# Patient Record
Sex: Female | Born: 1945
Health system: Southern US, Community
[De-identification: ages and names within clinical notes are randomized; demographics above are authoritative.]

## PROBLEM LIST (undated history)

## (undated) DIAGNOSIS — Z923 Personal history of irradiation: Secondary | ICD-10-CM

## (undated) DIAGNOSIS — R112 Nausea with vomiting, unspecified: Secondary | ICD-10-CM

## (undated) DIAGNOSIS — G459 Transient cerebral ischemic attack, unspecified: Secondary | ICD-10-CM

## (undated) DIAGNOSIS — I10 Essential (primary) hypertension: Secondary | ICD-10-CM

## (undated) DIAGNOSIS — Z9889 Other specified postprocedural states: Secondary | ICD-10-CM

## (undated) DIAGNOSIS — F419 Anxiety disorder, unspecified: Secondary | ICD-10-CM

## (undated) HISTORY — PX: BACK SURGERY: SHX140

## (undated) HISTORY — PX: ABDOMINAL HYSTERECTOMY: SHX81

## (undated) HISTORY — PX: DILATION AND CURETTAGE OF UTERUS: SHX78

---

## 1998-01-01 ENCOUNTER — Ambulatory Visit (HOSPITAL_BASED_OUTPATIENT_CLINIC_OR_DEPARTMENT_OTHER): Admission: RE | Admit: 1998-01-01 | Discharge: 1998-01-01 | Payer: Self-pay | Admitting: Orthopedic Surgery

## 1998-02-04 ENCOUNTER — Emergency Department (HOSPITAL_COMMUNITY): Admission: EM | Admit: 1998-02-04 | Discharge: 1998-02-04 | Payer: Self-pay | Admitting: Emergency Medicine

## 1998-12-06 ENCOUNTER — Inpatient Hospital Stay (HOSPITAL_COMMUNITY): Admission: RE | Admit: 1998-12-06 | Discharge: 1998-12-09 | Payer: Self-pay | Admitting: Obstetrics and Gynecology

## 1998-12-06 ENCOUNTER — Encounter (INDEPENDENT_AMBULATORY_CARE_PROVIDER_SITE_OTHER): Payer: Self-pay | Admitting: Specialist

## 1999-11-13 ENCOUNTER — Encounter: Admission: RE | Admit: 1999-11-13 | Discharge: 1999-11-13 | Payer: Self-pay | Admitting: Family Medicine

## 1999-11-13 ENCOUNTER — Encounter: Payer: Self-pay | Admitting: Family Medicine

## 2001-04-12 ENCOUNTER — Encounter (INDEPENDENT_AMBULATORY_CARE_PROVIDER_SITE_OTHER): Payer: Self-pay | Admitting: Specialist

## 2001-04-12 ENCOUNTER — Ambulatory Visit (HOSPITAL_COMMUNITY): Admission: RE | Admit: 2001-04-12 | Discharge: 2001-04-12 | Payer: Self-pay | Admitting: Gastroenterology

## 2001-07-28 ENCOUNTER — Encounter: Payer: Self-pay | Admitting: Family Medicine

## 2001-07-28 ENCOUNTER — Encounter: Admission: RE | Admit: 2001-07-28 | Discharge: 2001-07-28 | Payer: Self-pay | Admitting: Family Medicine

## 2001-07-29 ENCOUNTER — Encounter: Admission: RE | Admit: 2001-07-29 | Discharge: 2001-07-29 | Payer: Self-pay | Admitting: Family Medicine

## 2001-07-29 ENCOUNTER — Encounter: Payer: Self-pay | Admitting: Family Medicine

## 2001-08-11 ENCOUNTER — Ambulatory Visit (HOSPITAL_COMMUNITY): Admission: RE | Admit: 2001-08-11 | Discharge: 2001-08-12 | Payer: Self-pay | Admitting: Neurosurgery

## 2003-03-21 ENCOUNTER — Encounter: Payer: Self-pay | Admitting: Family Medicine

## 2003-03-21 ENCOUNTER — Encounter: Admission: RE | Admit: 2003-03-21 | Discharge: 2003-03-21 | Payer: Self-pay | Admitting: Family Medicine

## 2003-07-06 ENCOUNTER — Encounter: Admission: RE | Admit: 2003-07-06 | Discharge: 2003-07-06 | Payer: Self-pay | Admitting: Family Medicine

## 2004-11-19 ENCOUNTER — Encounter: Admission: RE | Admit: 2004-11-19 | Discharge: 2004-11-19 | Payer: Self-pay | Admitting: Family Medicine

## 2006-01-02 ENCOUNTER — Encounter: Admission: RE | Admit: 2006-01-02 | Discharge: 2006-01-02 | Payer: Self-pay | Admitting: Family Medicine

## 2008-10-26 ENCOUNTER — Encounter: Admission: RE | Admit: 2008-10-26 | Discharge: 2008-10-26 | Payer: Self-pay | Admitting: Family Medicine

## 2010-11-01 NOTE — Op Note (Signed)
[place]. [facility]  Patient:    Yvonne Rose, Yvonne Rose Visit Number: [id number] MRN: [id number]          Service Type: DSU Location: [id number] Attending Physician:  Cristi Loron Dictated by:   Cristi Loron, M.D. Proc. Date: 08/11/01 Admit Date:  08/11/2001                             Operative Report  PREOPERATIVE DIAGNOSES:  L5-S1 herniated nucleus pulposus, degenerative disk disease, lumbago, lumbar radiculopathy.  POSTOPERATIVE DIAGNOSES:  L5-S1 herniated nucleus pulposus, degenerative disk disease, lumbago, lumbar radiculopathy.  PROCEDURE:  Right L5-S1 microdiskectomy using microdissection.  SURGEON:  Cristi Loron, M.D.  ASSISTANT:  Mena Goes. Franky Macho, M.D.  ANESTHESIA:  General endotracheal.  ESTIMATED BLOOD LOSS:  100 cc.  SPECIMENS:  None.  DRAINS:  None.  COMPLICATIONS:  None.  BRIEF HISTORY:  The patient is a 65 year old white female who has suffered from back and right leg pain.  She failed medical management and was worked up as an outpatient with a lumbar MRI, which demonstrated a herniated disk at L5-S1 on the right.  The patients signs, symptoms, and physical exam were consistent with a right S1 radiculopathy.  The patient therefore weighed the risks, benefits, and alternatives of surgery and decided to proceed with a microdiskectomy.  DESCRIPTION OF PROCEDURE:  The patient was brought to the operating room by the anesthesia team.  General endotracheal anesthesia was induced.  The patient was then turned to the prone position on the Wilson frame and the lumbosacral region was then prepared with Betadine scrub and Betadine solution.  Sterile drapes were applied.  I then infiltrated the area to be incised with Marcaine with epinephrine solution.  I used a scalpel to make a linear incision over the L5-S1 interspace.  I used electrocautery to dissect down to the thoracolumbar fascia, divided the fascia just to the  right of midline, performing a right-sided subperiosteal dissection, stripping the paraspinous musculature from the right spinous process and lamina of L5 and the upper sacrum.  I inserted a McCullough retractor for exposure and then obtained the intraoperative radiograph to confirm my location.  I then brought the operating microscope into the field and under its magnification and illumination, I completed the microdissection/decompression.  I used the high-speed drill to perform a right L5 laminotomy.  I widened the laminotomy with the Kerrison punch and removed the right L5-S1 ligamentum flavum.  I performed a foraminotomy about the right S1 nerve root.  I then used microdissection to free up the nerve root from the epidural tissue and then retracted it carefully, retracted it medially with the DErrico retractor. This exposed a large herniated disk, which was compressing the right S1 nerve root ventrally.  I removed it in one large piece using the pituitary forceps and then palpated along the ventral surface of the thecal sac and along the exit route of the right S1 nerve root, noting that the neural structures were well decompressed.  I inspected the annulus fibrosus and noted that there was a hole in it where the disk had ruptured through but there was no cord compression.  I therefore did not violate the annulus fibrosus.  I achieved stringent hemostasis using bipolar cautery.  I then copiously irrigated the wound with bacitracin solution, removed the solution, and removed the McCullough retractor and then reapproximated the patients thoracolumbar fascia with interrupted #1 Vicryl suture, the  subcutaneous tissue with interrupted 3-0 Vicryl suture, and the skin with Steri-Strips and benzoin. The wound was then coated with bacitracin ointment, a sterile dressing was applied, the drapes were removed.  The patient was subsequently returned to the supine position, where she was extubated  by the anesthesia team and transported to the postanesthesia care unit in stable condition.  All sponge, instrument, and needle counts were correct at the end of this case. Dictated by:   Cristi Loron, M.D. Attending Physician:  Tressie Stalker D DD:  08/11/01 TD:  08/12/01 Job: [id number]

## 2010-11-01 NOTE — Procedures (Signed)
[place]. [facility]  Patient:    Yvonne Rose, Yvonne Rose Visit Number: [id number] MRN: [id number]          Service Type: END Location: ENDO Attending Physician:  Rich Brave Dictated by:   Florencia Reasons, M.D. Proc. Date: 04/12/01 Admit Date:  04/12/2001   CC:         Desma Maxim, M.D.                           Procedure Report  PROCEDURE PERFORMED:  Colonoscopy  ENDOSCOPIST:  Florencia Reasons, M.D.  INDICATIONS FOR PROCEDURE:  The patient is a 65 year old for colon cancer screening.  FINDINGS:  Rectal polyp snared.  Severe sigmoid diverticular disease with associated fixation.  DESCRIPTION OF PROCEDURE:  The nature, purpose and risks of the procedure had been discussed with the patient, who provided written consent.  The Olympus adjustable tension pediatric video colonoscope was advanced with great difficulty through a sigmoid region with a great deal of fixation and associated diverticular change, but then, fairly easily the remainder of the way around the colon to the cecum after which pull-back was initiated. Transillumination was just above the right lower quadrant at the time the tip of the scope was in what I believe was the cecum.  The quality of the prep was excellent and it is felt that all areas were well seen.  There was a 6 mm slightly erythematous, rather firm semipedunculated polyp at about 15 cm from the external anal opening removed by snare technique with complete hemostasis and no evidence of excessive cautery.  No other polyps were seen and there was no evidence of cancer, colitis, vascular malformations.  There was extreme sigmoid diverticular disease and fixation as noted above, but I did not appreciate pancolonic diverticulosis.  The small polyp removed as described above, was retrieved for histologic analysis by suctioning through the scope.  The patient tolerated the procedure well and there  were no apparent complications.  IMPRESSION: 1. Small rectosigmoid polyp snared at about 15 cm and retrieved for    histologic analysis. 2. Severe sigmoid diverticulosis and fixation.  PLAN:  Await pathology on the polyp.Dictated by:   Florencia Reasons, M.D.  Attending Physician:  Rich Brave DD:  04/12/01 TD:  04/13/01 Job: [id number]

## 2011-01-02 ENCOUNTER — Other Ambulatory Visit: Payer: Self-pay | Admitting: Family Medicine

## 2011-09-09 ENCOUNTER — Other Ambulatory Visit: Payer: Self-pay | Admitting: Family Medicine

## 2011-09-09 DIAGNOSIS — Z1231 Encounter for screening mammogram for malignant neoplasm of breast: Secondary | ICD-10-CM

## 2011-09-16 ENCOUNTER — Ambulatory Visit
Admission: RE | Admit: 2011-09-16 | Discharge: 2011-09-16 | Disposition: A | Discharge status: Home / Self Care | Payer: Medicare Other | Source: Ambulatory Visit | Attending: Family Medicine | Admitting: Family Medicine

## 2011-09-16 DIAGNOSIS — Z1231 Encounter for screening mammogram for malignant neoplasm of breast: Secondary | ICD-10-CM

## 2011-10-20 ENCOUNTER — Ambulatory Visit
Admission: RE | Admit: 2011-10-20 | Discharge: 2011-10-20 | Disposition: A | Discharge status: Home / Self Care | Payer: Medicare Other | Source: Ambulatory Visit | Attending: Gastroenterology | Admitting: Gastroenterology

## 2011-10-20 ENCOUNTER — Other Ambulatory Visit: Payer: Self-pay | Admitting: Gastroenterology

## 2011-10-20 DIAGNOSIS — R1011 Right upper quadrant pain: Secondary | ICD-10-CM

## 2012-08-11 ENCOUNTER — Other Ambulatory Visit: Payer: Self-pay | Admitting: Family Medicine

## 2012-08-11 DIAGNOSIS — M545 Low back pain, unspecified: Secondary | ICD-10-CM

## 2012-08-11 DIAGNOSIS — M5432 Sciatica, left side: Secondary | ICD-10-CM

## 2012-08-15 ENCOUNTER — Ambulatory Visit
Admission: RE | Admit: 2012-08-15 | Discharge: 2012-08-15 | Disposition: A | Discharge status: Home / Self Care | Payer: Medicare Other | Source: Ambulatory Visit | Attending: Family Medicine | Admitting: Family Medicine

## 2012-08-15 DIAGNOSIS — M545 Low back pain, unspecified: Secondary | ICD-10-CM

## 2012-08-15 DIAGNOSIS — M5432 Sciatica, left side: Secondary | ICD-10-CM

## 2013-01-10 ENCOUNTER — Other Ambulatory Visit: Payer: Self-pay | Admitting: Family Medicine

## 2013-01-10 DIAGNOSIS — Z1231 Encounter for screening mammogram for malignant neoplasm of breast: Secondary | ICD-10-CM

## 2013-01-28 ENCOUNTER — Ambulatory Visit
Admission: RE | Admit: 2013-01-28 | Discharge: 2013-01-28 | Disposition: A | Discharge status: Home / Self Care | Payer: Medicare Other | Source: Ambulatory Visit | Attending: Family Medicine | Admitting: Family Medicine

## 2013-01-28 DIAGNOSIS — Z1231 Encounter for screening mammogram for malignant neoplasm of breast: Secondary | ICD-10-CM

## 2014-08-13 ENCOUNTER — Emergency Department (HOSPITAL_COMMUNITY): Payer: PPO

## 2014-08-13 ENCOUNTER — Inpatient Hospital Stay (HOSPITAL_COMMUNITY)
Admission: EM | Admit: 2014-08-13 | Discharge: 2014-08-14 | DRG: 093 | Disposition: A | Discharge status: Home / Self Care | Payer: PPO | Attending: Family Medicine | Admitting: Family Medicine

## 2014-08-13 ENCOUNTER — Inpatient Hospital Stay (HOSPITAL_COMMUNITY): Payer: PPO

## 2014-08-13 ENCOUNTER — Encounter (HOSPITAL_COMMUNITY): Payer: Self-pay | Admitting: Oncology

## 2014-08-13 DIAGNOSIS — R4701 Aphasia: Principal | ICD-10-CM | POA: Diagnosis present

## 2014-08-13 DIAGNOSIS — Z7982 Long term (current) use of aspirin: Secondary | ICD-10-CM | POA: Diagnosis not present

## 2014-08-13 DIAGNOSIS — W109XXA Fall (on) (from) unspecified stairs and steps, initial encounter: Secondary | ICD-10-CM | POA: Diagnosis present

## 2014-08-13 DIAGNOSIS — R51 Headache: Secondary | ICD-10-CM

## 2014-08-13 DIAGNOSIS — F419 Anxiety disorder, unspecified: Secondary | ICD-10-CM | POA: Diagnosis present

## 2014-08-13 DIAGNOSIS — Z882 Allergy status to sulfonamides status: Secondary | ICD-10-CM | POA: Diagnosis not present

## 2014-08-13 DIAGNOSIS — G459 Transient cerebral ischemic attack, unspecified: Secondary | ICD-10-CM

## 2014-08-13 DIAGNOSIS — Z79899 Other long term (current) drug therapy: Secondary | ICD-10-CM

## 2014-08-13 DIAGNOSIS — I10 Essential (primary) hypertension: Secondary | ICD-10-CM | POA: Diagnosis present

## 2014-08-13 DIAGNOSIS — E7800 Pure hypercholesterolemia, unspecified: Secondary | ICD-10-CM | POA: Diagnosis present

## 2014-08-13 DIAGNOSIS — G451 Carotid artery syndrome (hemispheric): Secondary | ICD-10-CM

## 2014-08-13 DIAGNOSIS — R519 Headache, unspecified: Secondary | ICD-10-CM | POA: Diagnosis present

## 2014-08-13 DIAGNOSIS — G43109 Migraine with aura, not intractable, without status migrainosus: Secondary | ICD-10-CM | POA: Insufficient documentation

## 2014-08-13 HISTORY — DX: Anxiety disorder, unspecified: F41.9

## 2014-08-13 HISTORY — DX: Other specified postprocedural states: Z98.890

## 2014-08-13 HISTORY — DX: Nausea with vomiting, unspecified: R11.2

## 2014-08-13 HISTORY — DX: Essential (primary) hypertension: I10

## 2014-08-13 LAB — CBC
HCT: 43.4 % (ref 36.0–46.0)
Hemoglobin: 14.1 g/dL (ref 12.0–15.0)
MCH: 30.7 pg (ref 26.0–34.0)
MCHC: 32.5 g/dL (ref 30.0–36.0)
MCV: 94.6 fL (ref 78.0–100.0)
Platelets: 616 10*3/uL — ABNORMAL HIGH (ref 150–400)
RBC: 4.59 MIL/uL (ref 3.87–5.11)
RDW: 15.3 % (ref 11.5–15.5)
WBC: 13.6 10*3/uL — ABNORMAL HIGH (ref 4.0–10.5)

## 2014-08-13 LAB — DIFFERENTIAL
Basophils Absolute: 0 10*3/uL (ref 0.0–0.1)
Basophils Relative: 0 % (ref 0–1)
Eosinophils Absolute: 0.1 10*3/uL (ref 0.0–0.7)
Eosinophils Relative: 1 % (ref 0–5)
Lymphocytes Relative: 18 % (ref 12–46)
Lymphs Abs: 2.5 10*3/uL (ref 0.7–4.0)
Monocytes Absolute: 1.1 10*3/uL — ABNORMAL HIGH (ref 0.1–1.0)
Monocytes Relative: 8 % (ref 3–12)
Neutro Abs: 9.9 10*3/uL — ABNORMAL HIGH (ref 1.7–7.7)
Neutrophils Relative %: 73 % (ref 43–77)

## 2014-08-13 LAB — COMPREHENSIVE METABOLIC PANEL
ALT: 21 U/L (ref 0–35)
AST: 29 U/L (ref 0–37)
Albumin: 4.5 g/dL (ref 3.5–5.2)
Alkaline Phosphatase: 65 U/L (ref 39–117)
Anion gap: 11 (ref 5–15)
BUN: 13 mg/dL (ref 6–23)
CO2: 25 mmol/L (ref 19–32)
Calcium: 9.9 mg/dL (ref 8.4–10.5)
Chloride: 101 mmol/L (ref 96–112)
Creatinine, Ser: 0.62 mg/dL (ref 0.50–1.10)
GFR calc Af Amer: >90 mL/min (ref >90)
GFR calc non Af Amer: 89 mL/min — ABNORMAL LOW (ref >90)
Glucose, Bld: 104 mg/dL — ABNORMAL HIGH (ref 70–99)
Potassium: 3.6 mmol/L (ref 3.5–5.1)
Sodium: 137 mmol/L (ref 135–145)
Total Bilirubin: 0.8 mg/dL (ref 0.3–1.2)
Total Protein: 8.3 g/dL (ref 6.0–8.3)

## 2014-08-13 LAB — URINALYSIS, ROUTINE W REFLEX MICROSCOPIC
Bilirubin Urine: NEGATIVE
Glucose, UA: NEGATIVE mg/dL
Hgb urine dipstick: NEGATIVE
Ketones, ur: 40 mg/dL — AB
Leukocytes, UA: NEGATIVE
Nitrite: NEGATIVE
Protein, ur: NEGATIVE mg/dL
Specific Gravity, Urine: 1.012 (ref 1.005–1.030)
Urobilinogen, UA: 1 mg/dL (ref 0.0–1.0)
pH: 6 (ref 5.0–8.0)

## 2014-08-13 LAB — RAPID URINE DRUG SCREEN, HOSP PERFORMED
Amphetamines: NOT DETECTED
Barbiturates: NOT DETECTED
Benzodiazepines: POSITIVE — AB
Cocaine: NOT DETECTED
Opiates: NOT DETECTED
Tetrahydrocannabinol: NOT DETECTED

## 2014-08-13 LAB — ETHANOL: Alcohol, Ethyl (B): <5 mg/dL (ref 0–9)

## 2014-08-13 LAB — I-STAT CHEM 8, ED
BUN: 13 mg/dL (ref 6–23)
Calcium, Ion: 1.18 mmol/L (ref 1.13–1.30)
Chloride: 102 mmol/L (ref 96–112)
Creatinine, Ser: 0.6 mg/dL (ref 0.50–1.10)
Glucose, Bld: 107 mg/dL — ABNORMAL HIGH (ref 70–99)
HCT: 48 % — ABNORMAL HIGH (ref 36.0–46.0)
Hemoglobin: 16.3 g/dL — ABNORMAL HIGH (ref 12.0–15.0)
Potassium: 3.6 mmol/L (ref 3.5–5.1)
Sodium: 138 mmol/L (ref 135–145)
TCO2: 22 mmol/L (ref 0–100)

## 2014-08-13 LAB — PROTIME-INR
INR: 1.02 (ref 0.00–1.49)
Prothrombin Time: 13.5 seconds (ref 11.6–15.2)

## 2014-08-13 LAB — APTT: aPTT: 33 seconds (ref 24–37)

## 2014-08-13 LAB — SEDIMENTATION RATE: Sed Rate: 6 mm/hr (ref 0–22)

## 2014-08-13 LAB — I-STAT TROPONIN, ED: Troponin i, poc: 0 ng/mL (ref 0.00–0.08)

## 2014-08-13 MED ORDER — ACETAMINOPHEN 325 MG PO TABS: 650.0000 mg | ORAL_TABLET | ORAL | Status: DC | PRN

## 2014-08-13 MED ORDER — STROKE: EARLY STAGES OF RECOVERY BOOK
Freq: Once | Status: AC
  Administered 2014-08-13: 16:00:00

## 2014-08-13 MED ORDER — FOLIC ACID 1 MG PO TABS
1.0000 mg | ORAL_TABLET | Freq: Every day | ORAL | Status: DC
  Administered 2014-08-13 – 2014-08-14 (×2): 1 mg via ORAL
  Filled 2014-08-13 (×2): qty 1

## 2014-08-13 MED ORDER — LORAZEPAM 1 MG PO TABS: 1.0000 mg | ORAL_TABLET | Freq: Four times a day (QID) | ORAL | Status: DC | PRN

## 2014-08-13 MED ORDER — LORAZEPAM 2 MG/ML IJ SOLN: 1.0000 mg | Freq: Four times a day (QID) | INTRAMUSCULAR | Status: DC | PRN

## 2014-08-13 MED ORDER — ASPIRIN 325 MG PO TABS
325.0000 mg | ORAL_TABLET | Freq: Every day | ORAL | Status: DC
  Filled 2014-08-13: qty 1

## 2014-08-13 MED ORDER — THIAMINE HCL 100 MG/ML IJ SOLN: 100.0000 mg | Freq: Every day | INTRAMUSCULAR | Status: DC

## 2014-08-13 MED ORDER — ONDANSETRON HCL 4 MG/2ML IJ SOLN: 4.0000 mg | Freq: Four times a day (QID) | INTRAMUSCULAR | Status: DC | PRN

## 2014-08-13 MED ORDER — PRAVASTATIN SODIUM 40 MG PO TABS
40.0000 mg | ORAL_TABLET | Freq: Every day | ORAL | Status: DC
  Administered 2014-08-14: 40 mg via ORAL
  Filled 2014-08-13 (×2): qty 1

## 2014-08-13 MED ORDER — DIPHENHYDRAMINE HCL 50 MG/ML IJ SOLN
12.5000 mg | Freq: Once | INTRAMUSCULAR | Status: AC
  Administered 2014-08-13: 12.5 mg via INTRAVENOUS
  Filled 2014-08-13: qty 1

## 2014-08-13 MED ORDER — VITAMIN B-1 100 MG PO TABS
100.0000 mg | ORAL_TABLET | Freq: Every day | ORAL | Status: DC
  Administered 2014-08-13 – 2014-08-14 (×2): 100 mg via ORAL
  Filled 2014-08-13 (×2): qty 1

## 2014-08-13 MED ORDER — ENOXAPARIN SODIUM 40 MG/0.4ML ~~LOC~~ SOLN
40.0000 mg | SUBCUTANEOUS | Status: DC
  Administered 2014-08-13: 40 mg via SUBCUTANEOUS
  Filled 2014-08-13: qty 0.4

## 2014-08-13 MED ORDER — SODIUM CHLORIDE 0.9 % IV SOLN
INTRAVENOUS | Status: AC
  Administered 2014-08-13: 16:00:00 via INTRAVENOUS

## 2014-08-13 MED ORDER — METOCLOPRAMIDE HCL 5 MG/ML IJ SOLN
10.0000 mg | Freq: Once | INTRAMUSCULAR | Status: AC
  Administered 2014-08-13: 10 mg via INTRAVENOUS
  Filled 2014-08-13: qty 2

## 2014-08-13 MED ORDER — ADULT MULTIVITAMIN W/MINERALS CH
1.0000 | ORAL_TABLET | Freq: Every day | ORAL | Status: DC
  Administered 2014-08-13 – 2014-08-14 (×2): 1 via ORAL
  Filled 2014-08-13 (×2): qty 1

## 2014-08-13 MED ORDER — ASPIRIN 325 MG PO TABS
325.0000 mg | ORAL_TABLET | Freq: Every day | ORAL | Status: DC
  Administered 2014-08-13 – 2014-08-14 (×2): 325 mg via ORAL
  Filled 2014-08-13: qty 1

## 2014-08-13 NOTE — ED Notes (Signed)
EKG given to [place] for review

## 2014-08-13 NOTE — Consult Note (Signed)
Referring Physician: [place] Pink    Chief Complaint: Difficulty with speech and right hand numbness  HPI: Yvonne Rose is an 69 y.o. female who reports that when she was about to go to bed last evening she noted a left sided headache.  She was attempting to tell her husband but could not get the words out correctly.  She then noted some numbness in her right hand.  After about 15 minutes the symptoms resolved an her husband convinced her to not seek medical attention at that time.   This morning she awakened still concerned and presented for evaluation.  She has not had recurrence of her symptoms.    Date last known well: Date: 08/12/2014 Time last known well: Time: 22:00 tPA Given: No: Resolution of symptoms  Past Medical History  Diagnosis Date  . Hypertension   . Anxiety   . PONV (postoperative nausea and vomiting)     Past Surgical History  Procedure Laterality Date  . Back surgery    . Dilation and curettage of uterus    . Abdominal hysterectomy      Family History  Problem Relation Age of Onset  . Aneurysm Mother   . Hypertension Father   . Alcoholism Father         Son with HD  Social History:  reports that she has never smoked. She does not have any smokeless tobacco history on file. She reports that she drinks about 3.6 oz of alcohol per week. She reports that she does not use illicit drugs.  Allergies:  Allergies  Allergen Reactions  . Sulfa Antibiotics     Cant remember    Medications:  I have reviewed the patient's current medications. Prior to Admission:  Prescriptions prior to admission  Medication Sig Dispense Refill Last Dose  . acetaminophen (TYLENOL) 500 MG tablet Take 1,000 mg by mouth every 6 (six) hours as needed for mild pain.   08/12/2014 at Unknown time  . aspirin 81 MG tablet Take 81 mg by mouth daily.     . diazepam (VALIUM) 5 MG tablet Take 5 mg by mouth every 6 (six) hours as needed for anxiety.   0 Past Week at Unknown time  . lisinopril  (PRINIVIL,ZESTRIL) 20 MG tablet Take 20 mg by mouth daily.  0 08/12/2014 at Unknown time  . pravastatin (PRAVACHOL) 40 MG tablet Take 40 mg by mouth daily.   0 08/12/2014 at Unknown time  . sertraline (ZOLOFT) 100 MG tablet Take 100 mg by mouth daily.   0 Past Week at Unknown time   Scheduled: . aspirin  325 mg Oral Daily  . enoxaparin (LOVENOX) injection  40 mg Subcutaneous Q24H  . folic acid  1 mg Oral Daily  . multivitamin with minerals  1 tablet Oral Daily  . pravastatin  40 mg Oral Daily  . thiamine  100 mg Oral Daily   Or  . thiamine  100 mg Intravenous Daily    ROS: History obtained from the patient  General ROS: negative for - chills, fatigue, fever, night sweats, weight gain or weight loss Psychological ROS: negative for - behavioral disorder, hallucinations, memory difficulties, mood swings or suicidal ideation Ophthalmic ROS: negative for - blurry vision, double vision, eye pain or loss of vision ENT ROS: negative for - epistaxis, nasal discharge, oral lesions, sore throat, tinnitus or vertigo Allergy and Immunology ROS: negative for - hives or itchy/watery eyes Hematological and Lymphatic ROS: negative for - bleeding problems, bruising or swollen lymph nodes  Endocrine ROS: negative for - galactorrhea, hair pattern changes, polydipsia/polyuria or temperature intolerance Respiratory ROS: negative for - cough, hemoptysis, shortness of breath or wheezing Cardiovascular ROS: negative for - chest pain, dyspnea on exertion, edema or irregular heartbeat Gastrointestinal ROS: negative for - abdominal pain, diarrhea, hematemesis, nausea/vomiting or stool incontinence Genito-Urinary ROS: negative for - dysuria, hematuria, incontinence or urinary frequency/urgency Musculoskeletal ROS: negative for - joint swelling or muscular weakness Neurological ROS: as noted in HPI Dermatological ROS: negative for rash and skin lesion changes  Physical Examination: Blood pressure 144/65, pulse 85,  temperature 98.4 F (36.9 C), temperature source Oral, resp. rate 16, height 5\' 5"  (1.651 m), SpO2 100 %.  HEENT-  Normocephalic, no lesions, without obvious abnormality.  Normal external eye and conjunctiva.  Normal TM's bilaterally.  Normal auditory canals and external ears. Normal external nose, mucus membranes and septum.  Normal pharynx. Cardiovascular- S1, S2 normal, pulses palpable throughout   Lungs- chest clear, no wheezing, rales, normal symmetric air entry Abdomen- soft, non-tender; bowel sounds normal; no masses,  no organomegaly Extremities- no edema Lymph-no adenopathy palpable Musculoskeletal-no joint tenderness, deformity or swelling Skin-warm and dry, no hyperpigmentation, vitiligo, or suspicious lesions  Neurological Examination Mental Status: Alert, oriented, thought content appropriate.  Speech fluent without evidence of aphasia.  Able to follow 3 step commands without difficulty. Cranial Nerves: II: Discs flat bilaterally; Visual fields grossly normal, pupils equal, round, reactive to light and accommodation III,IV, VI: ptosis not present, extra-ocular motions intact bilaterally V,VII: smile symmetric, facial light touch sensation normal bilaterally VIII: hearing normal bilaterally IX,X: gag reflex present XI: bilateral shoulder shrug XII: midline tongue extension Motor: Right : Upper extremity   5/5    Left:     Upper extremity   5/5  Lower extremity   5/5     Lower extremity   5/5 Tone and bulk:normal tone throughout; no atrophy noted Sensory: Pinprick and light touch intact throughout, bilaterally Deep Tendon Reflexes: 2+ and symmetric with absent AJ's bilaterally Plantars: Right: downgoing   Left: downgoing Cerebellar: normal finger-to-nose and normal heel-to-shin testing bilaterally   Laboratory Studies:  Basic Metabolic Panel:  Recent Labs Lab 08/13/14 0715 08/13/14 0741  NA 137 138  K 3.6 3.6  CL 101 102  CO2 25  --   GLUCOSE 104* 107*  BUN  13 13  CREATININE 0.62 0.60  CALCIUM 9.9  --     Liver Function Tests:  Recent Labs Lab 08/13/14 0715  AST 29  ALT 21  ALKPHOS 65  BILITOT 0.8  PROT 8.3  ALBUMIN 4.5   No results for input(s): LIPASE, AMYLASE in the last 168 hours. No results for input(s): AMMONIA in the last 168 hours.  CBC:  Recent Labs Lab 08/13/14 0715 08/13/14 0741  WBC 13.6*  --   NEUTROABS 9.9*  --   HGB 14.1 16.3*  HCT 43.4 48.0*  MCV 94.6  --   PLT 616*  --     Cardiac Enzymes: No results for input(s): CKTOTAL, CKMB, CKMBINDEX, TROPONINI in the last 168 hours.  BNP: Invalid input(s): POCBNP  CBG: No results for input(s): GLUCAP in the last 168 hours.  Microbiology: No results found for this or any previous visit.  Coagulation Studies:  Recent Labs  08/13/14 0715  LABPROT 13.5  INR 1.02    Urinalysis:   Recent Labs Lab 08/13/14 1105  COLORURINE YELLOW  LABSPEC 1.012  PHURINE 6.0  GLUCOSEU NEGATIVE  HGBUR NEGATIVE  BILIRUBINUR NEGATIVE  KETONESUR 40*  PROTEINUR NEGATIVE  UROBILINOGEN 1.0  NITRITE NEGATIVE  LEUKOCYTESUR NEGATIVE    Lipid Panel: No results found for: CHOL, TRIG, HDL, CHOLHDL, VLDL, LDLCALC  HgbA1C: No results found for: HGBA1C  Urine Drug Screen:      Component Value Date/Time   LABOPIA NONE DETECTED 08/13/2014 1105   COCAINSCRNUR NONE DETECTED 08/13/2014 1105   LABBENZ POSITIVE* 08/13/2014 1105   AMPHETMU NONE DETECTED 08/13/2014 1105   THCU NONE DETECTED 08/13/2014 1105   LABBARB NONE DETECTED 08/13/2014 1105    Alcohol Level:   Recent Labs Lab 08/13/14 0715  ETH <5    Other results: EKG: sinus rhythm at 80 bpm.  Imaging: Ct Head Wo Contrast  08/13/2014   CLINICAL DATA:  69 year old female with slurred speech and dysarthria for several weeks  EXAM: CT HEAD WITHOUT CONTRAST  TECHNIQUE: Contiguous axial images were obtained from the base of the skull through the vertex without intravenous contrast.  COMPARISON:  None.  FINDINGS:  Negative for acute intracranial hemorrhage, acute infarction, mass, mass effect, hydrocephalus or midline shift. Gray-white differentiation is preserved throughout. Minimal cerebral cortical atrophy for age. Very mild periventricular white matter hypoattenuation consistent with chronic microvascular ischemic disease. Less specific subcortical white matter hypoattenuation noted in the posterior right parietal lobe (image 19 series 3) and periventricular left occipital lobe (image 14 series 3).  IMPRESSION: 1. No acute intracranial abnormality. 2. Nonspecific subcortical white matter hypoattenuation in the posterior right parietal and periventricular left occipital lobes. The finding is nonspecific but can be seen in the setting of chronic microvascular ischemia, a demyelinating process such as multiple sclerosis, vasculitis, complicated migraine headaches, or as the sequelae of a prior infectious or inflammatory process. 3. Very mild atrophy and chronic microvascular ischemic white matter disease.   Electronically Signed   By: Jacqulynn Cadet M.D.   On: 08/13/2014 08:25   Mri Brain Without Contrast  08/13/2014   CLINICAL DATA:  LEFT-sided headaches. Difficulty speaking. Transient aphasia. RIGHT hand numbness. Initial encounter.  EXAM: MRI HEAD WITHOUT CONTRAST  MRA HEAD WITHOUT CONTRAST  TECHNIQUE: Multiplanar, multiecho pulse sequences of the brain and surrounding structures were obtained without intravenous contrast. Angiographic images of the head were obtained using MRA technique without contrast.  COMPARISON:  CT head earlier today.  FINDINGS: MRI HEAD FINDINGS  No evidence for acute infarction, hemorrhage, mass lesion, hydrocephalus, or extra-axial fluid. Moderate cerebral and cerebellar atrophy. Mild subcortical and periventricular T2 and FLAIR hyperintensities, likely chronic microvascular ischemic change. No midline abnormality. Flow voids are maintained throughout the carotid, basilar, and vertebral  arteries. There are no areas of chronic hemorrhage. Visualized calvarium, skull base, and upper cervical osseous structures unremarkable. Scalp and extracranial soft tissues, orbits, sinuses, and mastoids show no acute process.  MRA HEAD FINDINGS  Minor irregularity in atheromatous outpouching from the cavernous ICA on the RIGHT. No flow-limiting stenosis.  Similar atheromatous outpouching from the cavernous ICA on the LEFT. No focal narrowing or irregularity. Both ICA termini are widely patent.  Basilar artery widely patent. LEFT vertebral is dominant but the RIGHT vertebral does contribute to its formation.  No ACA, MCA, or PCA proximal lesion. No cerebellar branch occlusion. No intracranial berry aneurysm.  IMPRESSION: No acute intracranial findings.  Chronic changes as described.  No flow-limiting anterior or posterior circulation lesion. Minor BILATERAL cavernous carotid atheromatous change.   Electronically Signed   By: Rolla Flatten M.D.   On: 08/13/2014 18:12   Mr Jodene Nam Head/brain Wo Cm  08/13/2014   CLINICAL DATA:  LEFT-sided headaches. Difficulty speaking. Transient aphasia. RIGHT hand numbness. Initial encounter.  EXAM: MRI HEAD WITHOUT CONTRAST  MRA HEAD WITHOUT CONTRAST  TECHNIQUE: Multiplanar, multiecho pulse sequences of the brain and surrounding structures were obtained without intravenous contrast. Angiographic images of the head were obtained using MRA technique without contrast.  COMPARISON:  CT head earlier today.  FINDINGS: MRI HEAD FINDINGS  No evidence for acute infarction, hemorrhage, mass lesion, hydrocephalus, or extra-axial fluid. Moderate cerebral and cerebellar atrophy. Mild subcortical and periventricular T2 and FLAIR hyperintensities, likely chronic microvascular ischemic change. No midline abnormality. Flow voids are maintained throughout the carotid, basilar, and vertebral arteries. There are no areas of chronic hemorrhage. Visualized calvarium, skull base, and upper cervical osseous  structures unremarkable. Scalp and extracranial soft tissues, orbits, sinuses, and mastoids show no acute process.  MRA HEAD FINDINGS  Minor irregularity in atheromatous outpouching from the cavernous ICA on the RIGHT. No flow-limiting stenosis.  Similar atheromatous outpouching from the cavernous ICA on the LEFT. No focal narrowing or irregularity. Both ICA termini are widely patent.  Basilar artery widely patent. LEFT vertebral is dominant but the RIGHT vertebral does contribute to its formation.  No ACA, MCA, or PCA proximal lesion. No cerebellar branch occlusion. No intracranial berry aneurysm.  IMPRESSION: No acute intracranial findings.  Chronic changes as described.  No flow-limiting anterior or posterior circulation lesion. Minor BILATERAL cavernous carotid atheromatous change.   Electronically Signed   By: Rolla Flatten M.D.   On: 08/13/2014 18:12    Assessment: 69 y.o. female presenting after an episode of headache, expressive aphasia and right hand numbness.  Symptoms have resolved.  Concern is for TIA.  Head CT reviewed and there is concern over some areas of hypoattenuation that were noted in the occipital lobes.  MRI of the brain personally reviewed and shows no acute changes.  Further work up recommended.  Patient on ASA at home.    Stroke Risk Factors - hypertension  Plan: 1. HgbA1c, fasting lipid panel 2. MRI, MRA  of the brain without contrast 3. PT consult, OT consult, Speech consult 4. Echocardiogram 5. Carotid dopplers 6. Prophylactic therapy-Antiplatelet med: Aspirin - dose 325mg  daily 7. NPO until RN stroke swallow screen 8. Telemetry monitoring 9. Frequent neuro checks  Alexis Goodell, MD [facility] [phone] 08/13/2014, 6:53 PM

## 2014-08-13 NOTE — ED Notes (Signed)
Per pt she has been having sx of slurred speech and inability to say what she means for a "few weeks" now.  Last night at 2200 pt reports that the symptoms got worse.  Pt is able to follow commands, strength is equal b/l.

## 2014-08-13 NOTE — ED Notes (Signed)
I just gave report to [place], RN on 4NT at [facility].  I informed her of [facility] delay.

## 2014-08-13 NOTE — ED Provider Notes (Signed)
CSN: [id number]     Arrival date & time 08/13/14  0701 History   First MD Initiated Contact with Patient 08/13/14 [phone]     Chief Complaint  Patient presents with  . Aphasia     Patient is a 69 y.o. female presenting with headaches. The history is provided by the patient and the spouse.  Headache Pain location:  Generalized Quality:  Dull Onset quality:  Gradual Duration:  1 day Timing:  Constant Progression:  Worsening Chronicity:  Recurrent Relieved by:  Nothing Worsened by:  Nothing Associated symptoms: focal weakness and weakness   Associated symptoms: no abdominal pain and no fever   Patient reports of HA yesterday that has been constant. (started at 11am on 2/27) She reports she has had these HA previously but this episode is worse Husband reports patient also has had difficulty speaking at times since yesterday This morning she woke up with right UE weakness but this is improving No h/o stroke   Past Medical History  Diagnosis Date  . Hypertension   . Anxiety    Past Surgical History  Procedure Laterality Date  . Back surgery    . Dilation and curettage of uterus     History reviewed. No pertinent family history. History  Substance Use Topics  . Smoking status: Never Smoker   . Smokeless tobacco: Not on file  . Alcohol Use: Yes   OB History    No data available     Review of Systems  Constitutional: Negative for fever.  Cardiovascular: Negative for chest pain.  Gastrointestinal: Negative for abdominal pain.  Musculoskeletal:       Chronic back pain  Neurological: Positive for focal weakness, speech difficulty, weakness and headaches.  All other systems reviewed and are negative.     Allergies  Sulfa antibiotics  Home Medications   Prior to Admission medications   Medication Sig Start Date End Date Taking? Authorizing Provider  acetaminophen (TYLENOL) 500 MG tablet Take 1,000 mg by mouth every 6 (six) hours as needed for mild pain.   Yes  Historical Provider, MD  diazepam (VALIUM) 5 MG tablet Take 5 mg by mouth every 6 (six) hours as needed for anxiety.  07/28/14  Yes Historical Provider, MD  lisinopril (PRINIVIL,ZESTRIL) 20 MG tablet Take 20 mg by mouth daily. 06/05/14  Yes Historical Provider, MD  pravastatin (PRAVACHOL) 40 MG tablet Take 40 mg by mouth daily.  06/05/14  Yes Historical Provider, MD  sertraline (ZOLOFT) 100 MG tablet Take 100 mg by mouth daily.  08/10/14  Yes Historical Provider, MD   BP 193/81 mmHg  Pulse 83  Temp(Src) 98.6 F (37 C) (Oral)  Resp 16  SpO2 98% Physical Exam CONSTITUTIONAL: Well developed/well nourished HEAD: Normocephalic/atraumatic EYES: EOMI/PERRL ENMT: Mucous membranes moist NECK: supple no meningeal signs SPINE/BACK:entire spine nontender CV: S1/S2 noted, no murmurs/rubs/gallops noted LUNGS: Lungs are clear to auscultation bilaterally, no apparent distress ABDOMEN: soft, nontender, no rebound or guarding, bowel sounds noted throughout abdomen GU:no cva tenderness NEURO: Pt is awake/alert/appropriate, moves all extremitiesx4.  No facial droop.  No arm/leg drift.  She has mild expressive aphasia at times.   EXTREMITIES: pulses normal/equal, full ROM SKIN: warm, color normal PSYCH: no abnormalities of mood noted, alert and oriented to situation  ED Course  Procedures  tPA in stroke considered but not given due to:  Onset over 3-4.5hours 8:46 AM D/w dr Doy Mince Recommends admit to [facility] Will call [facility] for admission 9:01 AM Pt improved No focal weakness  Her speech is fluent Will admit to [place] D/w dr Bonnielee Haff, will admit and he will see in ER Patient updated She reports continued HA Will order reglan/benadrl Labs Review Labs Reviewed  CBC - Abnormal; Notable for the following:    WBC 13.6 (*)    Platelets 616 (*)    All other components within normal limits  DIFFERENTIAL - Abnormal; Notable for the following:    Neutro Abs 9.9 (*)    Monocytes  Absolute 1.1 (*)    All other components within normal limits  COMPREHENSIVE METABOLIC PANEL - Abnormal; Notable for the following:    Glucose, Bld 104 (*)    GFR calc non Af Amer 89 (*)    All other components within normal limits  I-STAT CHEM 8, ED - Abnormal; Notable for the following:    Glucose, Bld 107 (*)    Hemoglobin 16.3 (*)    HCT 48.0 (*)    All other components within normal limits  ETHANOL  PROTIME-INR  APTT  URINE RAPID DRUG SCREEN (HOSP PERFORMED)  URINALYSIS, ROUTINE W REFLEX MICROSCOPIC  I-STAT TROPOININ, ED    Imaging Review Ct Head Wo Contrast  08/13/2014   CLINICAL DATA:  69 year old female with slurred speech and dysarthria for several weeks  EXAM: CT HEAD WITHOUT CONTRAST  TECHNIQUE: Contiguous axial images were obtained from the base of the skull through the vertex without intravenous contrast.  COMPARISON:  None.  FINDINGS: Negative for acute intracranial hemorrhage, acute infarction, mass, mass effect, hydrocephalus or midline shift. Gray-white differentiation is preserved throughout. Minimal cerebral cortical atrophy for age. Very mild periventricular white matter hypoattenuation consistent with chronic microvascular ischemic disease. Less specific subcortical white matter hypoattenuation noted in the posterior right parietal lobe (image 19 series 3) and periventricular left occipital lobe (image 14 series 3).  IMPRESSION: 1. No acute intracranial abnormality. 2. Nonspecific subcortical white matter hypoattenuation in the posterior right parietal and periventricular left occipital lobes. The finding is nonspecific but can be seen in the setting of chronic microvascular ischemia, a demyelinating process such as multiple sclerosis, vasculitis, complicated migraine headaches, or as the sequelae of a prior infectious or inflammatory process. 3. Very mild atrophy and chronic microvascular ischemic white matter disease.   Electronically Signed   By: Jacqulynn Cadet M.D.    On: 08/13/2014 08:25     EKG Interpretation   Date/Time:  Sunday August 13 2014 07:09:49 EST Ventricular Rate:  80 PR Interval:  152 QRS Duration: 101 QT Interval:  408 QTC Calculation: 471 R Axis:   74 Text Interpretation:  Sinus rhythm Consider left atrial enlargement RSR'  in V1 or V2, right VCD or RVH Nonspecific T abnormalities, anterior leads  changes noted from prior Confirmed by Christy Gentles  MD, Darrell Hauk ([id number]) on  08/13/2014 7:30:38 AM      MDM   Final diagnoses:  Transient cerebral ischemia, unspecified transient cerebral ischemia type    Nursing notes including past medical history and social history reviewed and considered in documentation Labs/vital reviewed myself and considered during evaluation     Sharyon Cable, MD 08/13/14 [phone]

## 2014-08-13 NOTE — H&P (Signed)
[facility] History and Physical  Yvonne Rose QQI:[id number] DOB: 02/09/1946 DOA: 08/13/2014   PCP: Mayra Neer, MD  Specialists: None  Chief Complaint: Difficulty speaking  HPI: Yvonne Rose is a 69 y.o. female with a past medical history of hypertension, depression, who was in her usual state of health till yesterday morning when she woke up, started having headache in the left side of her head. And then she found herself not able to speak appropriately. She was thinking of the right words, but the wrong words would come out. She wasn't making much sense to her husband. Denies any visual disturbances. At that time she did not have any weakness in any one side of the body. She reports that she had a fall on Thursday when coming down the stairs. Denies any other injuries. No fever but had some chills. And then today she woke up and experienced some numbness in the right hand. Denies any weakness. And at that time, she decided to come into the hospital. No difficulty swallowing. She also tells me that about 2 weeks ago she took herself off of sertraline by weaning. And then 2 days ago she took started taking that medicine again. Denies any seizure type activity. Continues to have a headache on the left side of her head. Headache is about 6 out of 10 in intensity, aching pain without radiation.  Home Medications: Prior to Admission medications   Medication Sig Start Date End Date Taking? Authorizing Provider  acetaminophen (TYLENOL) 500 MG tablet Take 1,000 mg by mouth every 6 (six) hours as needed for mild pain.   Yes Historical Provider, MD  aspirin 81 MG tablet Take 81 mg by mouth daily.   Yes Historical Provider, MD  diazepam (VALIUM) 5 MG tablet Take 5 mg by mouth every 6 (six) hours as needed for anxiety.  07/28/14  Yes Historical Provider, MD  lisinopril (PRINIVIL,ZESTRIL) 20 MG tablet Take 20 mg by mouth daily. 06/05/14  Yes Historical Provider, MD  pravastatin (PRAVACHOL)  40 MG tablet Take 40 mg by mouth daily.  06/05/14  Yes Historical Provider, MD  sertraline (ZOLOFT) 100 MG tablet Take 100 mg by mouth daily.  08/10/14  Yes Historical Provider, MD    Allergies:  Allergies  Allergen Reactions  . Sulfa Antibiotics     Cant remember    Past Medical History: Past Medical History  Diagnosis Date  . Hypertension   . Anxiety     Past Surgical History  Procedure Laterality Date  . Back surgery    . Dilation and curettage of uterus      Social History: Lives with her husband. 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 Denies smoking. Drinks 4 beers on a daily basis. Denies any liquor use of wine intake. Denies any illicit drug use. Independent with daily activities.  Family History:  Family History  Problem Relation Age of Onset  . Aneurysm Mother   . Hypertension Father   . Alcoholism Father      Review of Systems - History obtained from the patient General ROS: negative Psychological ROS: positive for - anxiety Ophthalmic ROS: negative ENT ROS: negative Allergy and Immunology ROS: negative Hematological and Lymphatic ROS: negative Endocrine ROS: negative Respiratory ROS: no cough, shortness of breath, or wheezing Cardiovascular ROS: no chest pain or dyspnea on exertion Gastrointestinal ROS: no abdominal pain, change in bowel habits, or black or bloody stools Genito-Urinary ROS: no dysuria, trouble voiding, or hematuria Musculoskeletal ROS: negative Neurological  ROS: as in hpi Dermatological ROS: negative  Physical Examination  Filed Vitals:   08/13/14 0714 08/13/14 0813 08/13/14 0818  BP: 193/81 177/69   Pulse: 83 78   Temp: 98.6 F (37 C)  97.8 F (36.6 C)  TempSrc: Oral    Resp: 16 16   SpO2: 98% 100%     BP 177/69 mmHg  Pulse 78  Temp(Src) 97.8 F (36.6 C) (Oral)  Resp 16  SpO2 100%  General appearance: alert, cooperative, appears stated age and no distress Head: Normocephalic, without obvious  abnormality, atraumatic Eyes: conjunctivae/corneas clear. PERRL, EOM's intact.  Throat: lips, mucosa, and tongue normal; teeth and gums normal Neck: no adenopathy, no carotid bruit, no JVD, supple, symmetrical, trachea midline and thyroid not enlarged, symmetric, no tenderness/mass/nodules Resp: clear to auscultation bilaterally Cardio: regular rate and rhythm, S1, S2 normal, no murmur, click, rub or gallop GI: soft, non-tender; bowel sounds normal; no masses,  no organomegaly Extremities: extremities normal, atraumatic, no cyanosis or edema Pulses: 2+ and symmetric Skin: Skin color, texture, turgor normal. No rashes or lesions Lymph nodes: Cervical, supraclavicular, and axillary nodes normal. Neurologic: Alert and oriented X 3. Cranial nerves II-12 intact. No pronator drift. Strength is 5-5 bilateral upper and lower extremity. There are +2 normal. Gait was not assessed. Normal sensations.   Laboratory Data: Results for orders placed or performed during the hospital encounter of 08/13/14 (from the past 48 hour(s))  Ethanol     Status: None   Collection Time: 08/13/14  7:15 AM  Result Value Ref Range   Alcohol, Ethyl (B) <5 0 - 9 mg/dL    Comment:        LOWEST DETECTABLE LIMIT FOR SERUM ALCOHOL IS 11 mg/dL FOR MEDICAL PURPOSES ONLY   Protime-INR     Status: None   Collection Time: 08/13/14  7:15 AM  Result Value Ref Range   Prothrombin Time 13.5 11.6 - 15.2 seconds   INR 1.02 0.00 - 1.49  APTT     Status: None   Collection Time: 08/13/14  7:15 AM  Result Value Ref Range   aPTT 33 24 - 37 seconds  CBC     Status: Abnormal   Collection Time: 08/13/14  7:15 AM  Result Value Ref Range   WBC 13.6 (H) 4.0 - 10.5 K/uL   RBC 4.59 3.87 - 5.11 MIL/uL   Hemoglobin 14.1 12.0 - 15.0 g/dL   HCT 43.4 36.0 - 46.0 %   MCV 94.6 78.0 - 100.0 fL   MCH 30.7 26.0 - 34.0 pg   MCHC 32.5 30.0 - 36.0 g/dL   RDW 15.3 11.5 - 15.5 %   Platelets 616 (H) 150 - 400 K/uL  Differential     Status:  Abnormal   Collection Time: 08/13/14  7:15 AM  Result Value Ref Range   Neutrophils Relative % 73 43 - 77 %   Neutro Abs 9.9 (H) 1.7 - 7.7 K/uL   Lymphocytes Relative 18 12 - 46 %   Lymphs Abs 2.5 0.7 - 4.0 K/uL   Monocytes Relative 8 3 - 12 %   Monocytes Absolute 1.1 (H) 0.1 - 1.0 K/uL   Eosinophils Relative 1 0 - 5 %   Eosinophils Absolute 0.1 0.0 - 0.7 K/uL   Basophils Relative 0 0 - 1 %   Basophils Absolute 0.0 0.0 - 0.1 K/uL  Comprehensive metabolic panel     Status: Abnormal   Collection Time: 08/13/14  7:15 AM  Result Value Ref Range  Sodium 137 135 - 145 mmol/L   Potassium 3.6 3.5 - 5.1 mmol/L   Chloride 101 96 - 112 mmol/L   CO2 25 19 - 32 mmol/L   Glucose, Bld 104 (H) 70 - 99 mg/dL   BUN 13 6 - 23 mg/dL   Creatinine, Ser 0.62 0.50 - 1.10 mg/dL   Calcium 9.9 8.4 - 10.5 mg/dL   Total Protein 8.3 6.0 - 8.3 g/dL   Albumin 4.5 3.5 - 5.2 g/dL   AST 29 0 - 37 U/L   ALT 21 0 - 35 U/L   Alkaline Phosphatase 65 39 - 117 U/L   Total Bilirubin 0.8 0.3 - 1.2 mg/dL   GFR calc non Af Amer 89 (L) >90 mL/min   GFR calc Af Amer >90 >90 mL/min    Comment: (NOTE) The eGFR has been calculated using the CKD EPI equation. This calculation has not been validated in all clinical situations. eGFR's persistently <90 mL/min signify possible Chronic Kidney Disease.    Anion gap 11 5 - 15  I-Stat Troponin, ED (not at [facility])     Status: None   Collection Time: 08/13/14  7:39 AM  Result Value Ref Range   Troponin i, poc 0.00 0.00 - 0.08 ng/mL   Comment 3            Comment: Due to the release kinetics of cTnI, a negative result within the first hours of the onset of symptoms does not rule out myocardial infarction with certainty. If myocardial infarction is still suspected, repeat the test at appropriate intervals.   I-Stat Chem 8, ED     Status: Abnormal   Collection Time: 08/13/14  7:41 AM  Result Value Ref Range   Sodium 138 135 - 145 mmol/L   Potassium 3.6 3.5 - 5.1 mmol/L    Chloride 102 96 - 112 mmol/L   BUN 13 6 - 23 mg/dL   Creatinine, Ser 0.60 0.50 - 1.10 mg/dL   Glucose, Bld 107 (H) 70 - 99 mg/dL   Calcium, Ion 1.18 1.13 - 1.30 mmol/L   TCO2 22 0 - 100 mmol/L   Hemoglobin 16.3 (H) 12.0 - 15.0 g/dL   HCT 48.0 (H) 36.0 - 46.0 %    Radiology Reports: Ct Head Wo Contrast  08/13/2014   CLINICAL DATA:  69 year old female with slurred speech and dysarthria for several weeks  EXAM: CT HEAD WITHOUT CONTRAST  TECHNIQUE: Contiguous axial images were obtained from the base of the skull through the vertex without intravenous contrast.  COMPARISON:  None.  FINDINGS: Negative for acute intracranial hemorrhage, acute infarction, mass, mass effect, hydrocephalus or midline shift. Gray-white differentiation is preserved throughout. Minimal cerebral cortical atrophy for age. Very mild periventricular white matter hypoattenuation consistent with chronic microvascular ischemic disease. Less specific subcortical white matter hypoattenuation noted in the posterior right parietal lobe (image 19 series 3) and periventricular left occipital lobe (image 14 series 3).  IMPRESSION: 1. No acute intracranial abnormality. 2. Nonspecific subcortical white matter hypoattenuation in the posterior right parietal and periventricular left occipital lobes. The finding is nonspecific but can be seen in the setting of chronic microvascular ischemia, a demyelinating process such as multiple sclerosis, vasculitis, complicated migraine headaches, or as the sequelae of a prior infectious or inflammatory process. 3. Very mild atrophy and chronic microvascular ischemic white matter disease.   Electronically Signed   By: Jacqulynn Cadet M.D.   On: 08/13/2014 08:25    Electrocardiogram: SR at 80bpm. Normal axis. Normal intervals.  No q waves. RSR pattern. Non specific T wave changes. No ST changes.  Problem List  Principal Problem:   Aphasia Active Problems:   Left-sided headache   Essential hypertension    Hypercholesteremia   Assessment: This is a 69 year old Caucasian female who presents with difficulty speaking and right hand numbness which was transient. She also has a left-sided headache. Differential diagnosis is broad and includes TIA, stroke, multiple sclerosis, complicated migraine. Epileptic disorder is less likely. Her onset of symptoms was more than 24 hours ago. She is not a candidate for TPA.  Plan: #1 Transient Aphasia/transient right hand numbness/Headache: CT head that showed nonspecific findings. She'll be admitted for TIA workup. Discussed with neurology and they will consult. Continue aspirin. She does take aspirin at home on a daily basis. Lipid panel. Other stroke workup as outlined in the orders. PT, OT will be consulted. EEG ordered. Check ESR due to her headache. Neurology to weigh in regarding her headache as well.  #2 history of essential hypertension: Hold her oral agents. Allow permissive hypertension for now.  #3 Hypercholesterolemia: Continue with her home medications  #4 Alcohol intake: She drinks 4 beers every day. Sometimes 6. We will place her on the CIWA protocol for now. Thiamine. She is at low risk for alcohol withdrawal.   DVT Prophylaxis: Lovenox Code Status: Full code Family Communication: Discussed with the patient and her husband  Disposition Plan: Transfer to [facility] for [facility] management.   Further management decisions will depend on results of further testing and patient's response to treatment.   [facility]  [facility] Pager [phone]  If 7PM-7AM, please contact night-coverage [email or web address] Password [email or web address]  08/13/2014, 10:00 AM

## 2014-08-14 ENCOUNTER — Inpatient Hospital Stay (HOSPITAL_COMMUNITY): Payer: PPO

## 2014-08-14 DIAGNOSIS — G43909 Migraine, unspecified, not intractable, without status migrainosus: Secondary | ICD-10-CM

## 2014-08-14 DIAGNOSIS — G459 Transient cerebral ischemic attack, unspecified: Secondary | ICD-10-CM

## 2014-08-14 LAB — RAPID URINE DRUG SCREEN, HOSP PERFORMED
Amphetamines: NOT DETECTED
Barbiturates: NOT DETECTED
Benzodiazepines: POSITIVE — AB
Cocaine: NOT DETECTED
Opiates: NOT DETECTED
Tetrahydrocannabinol: NOT DETECTED

## 2014-08-14 LAB — LIPID PANEL
Cholesterol: 194 mg/dL (ref 0–200)
HDL: 58 mg/dL (ref >39)
LDL Cholesterol: 116 mg/dL — ABNORMAL HIGH (ref 0–99)
Total CHOL/HDL Ratio: 3.3 RATIO
Triglycerides: 100 mg/dL (ref <150)
VLDL: 20 mg/dL (ref 0–40)

## 2014-08-14 LAB — HEMOGLOBIN A1C
Hgb A1c MFr Bld: 5.7 % — ABNORMAL HIGH (ref 4.8–5.6)
Mean Plasma Glucose: 117 mg/dL

## 2014-08-14 MED ORDER — ASPIRIN 325 MG PO TABS: 325.0000 mg | ORAL_TABLET | Freq: Every day | ORAL | Status: DC

## 2014-08-14 MED ORDER — PRAVASTATIN SODIUM 40 MG PO TABS: 80.0000 mg | ORAL_TABLET | Freq: Every day | ORAL | Status: DC

## 2014-08-14 MED ORDER — PRAVASTATIN SODIUM 80 MG PO TABS: 80.0000 mg | ORAL_TABLET | Freq: Every day | ORAL | Status: AC

## 2014-08-14 NOTE — Evaluation (Signed)
Physical Therapy Evaluation and Discharge Patient Details Name: DALEAH COULSON MRN: [id number] DOB: 04-Nov-1945 Today's Date: 08/14/2014   History of Present Illness  Pt is a 69 yo female who had a headache and then short onset of word finding difficulty and R hand numbness for 15 min. Symptoms have since resolved.  Clinical Impression  Pt functioning at baseline. Pt at minimal falls risk as indicated by score of 24/24 on DGI. Pt with no further acute PT needs at this time. Please re-consult if needed in future. PT signing off.    Follow Up Recommendations No PT follow up    Equipment Recommendations  None recommended by PT    Recommendations for Other Services       Precautions / Restrictions Precautions Precautions: None Restrictions Weight Bearing Restrictions: No      Mobility  Bed Mobility Overal bed mobility: Independent                Transfers Overall transfer level: Independent Equipment used: None             General transfer comment: safe transfers  Ambulation/Gait Ambulation/Gait assistance: Independent Ambulation Distance (Feet): 580 Feet Assistive device: None Gait Pattern/deviations: WFL(Within Functional Limits)     General Gait Details: no episodes of LOB  Stairs            Wheelchair Mobility    Modified Rankin (Stroke Patients Only)       Balance Overall balance assessment: Independent                               Standardized Balance Assessment Standardized Balance Assessment : Dynamic Gait Index   Dynamic Gait Index Level Surface: Normal Change in Gait Speed: Normal Gait with Horizontal Head Turns: Normal Gait with Vertical Head Turns: Normal Gait and Pivot Turn: Normal Step Over Obstacle: Normal Step Around Obstacles: Normal Steps: Normal Total Score: 24       Pertinent Vitals/Pain Pain Assessment: No/denies pain    Home Living Family/patient expects to be discharged to:: Private  residence Living Arrangements: Spouse/significant other Available Help at Discharge: Family;Available 24 hours/day Type of Home: House Home Access: Stairs to enter Entrance Stairs-Rails: Can reach both Entrance Stairs-Number of Steps: 3-7 Home Layout: One level Home Equipment: None      Prior Function Level of Independence: Independent               Hand Dominance   Dominant Hand: Right    Extremity/Trunk Assessment   Upper Extremity Assessment: Overall WFL for tasks assessed           Lower Extremity Assessment: Overall WFL for tasks assessed      Cervical / Trunk Assessment: Normal  Communication   Communication: No difficulties  Cognition Arousal/Alertness: Awake/alert Behavior During Therapy: WFL for tasks assessed/performed Overall Cognitive Status: Within Functional Limits for tasks assessed                      General Comments      Exercises        Assessment/Plan    PT Assessment Patent does not need any further PT services  PT Diagnosis Generalized weakness   PT Problem List    PT Treatment Interventions     PT Goals (Current goals can be found in the Care Plan section) Acute Rehab PT Goals Patient Stated Goal: home today PT Goal Formulation: All assessment and education  complete, DC therapy    Frequency     Barriers to discharge        Co-evaluation               End of Session Equipment Utilized During Treatment: Gait belt Activity Tolerance: Patient tolerated treatment well Patient left: in bed;with call bell/phone within reach;with family/visitor present Nurse Communication: Mobility status         Time: 1224-1239 PT Time Calculation (min) (ACUTE ONLY): 15 min   Charges:   PT Evaluation $Initial PT Evaluation Tier I: 1 Procedure     PT G CodesKingsley Callander 08/14/2014, 1:24 PM  Kittie Plater, PT, DPT Pager #: [phone] Office #: [phone]

## 2014-08-14 NOTE — Procedures (Signed)
ELECTROENCEPHALOGRAM REPORT  Patient: Yvonne Rose       Room #: 0V77 EEG No. ID: [id number] Age: 69 y.o.        Sex: female Referring Physician: Darrick Meigs, G Report Date:  08/14/2014        Interpreting Physician: Anthony Sar  History: [name] is an 69 y.o. female who experienced an episode of speech output difficulty with associated headache as well as numbness involving her right hand on the evening of 08/12/2014.  Indications for study:  Rule out focal seizure disorder.  Technique: This is an 18 channel routine scalp EEG performed at the bedside with bipolar and monopolar montages arranged in accordance to the international 10/20 system of electrode placement.   Description: This EEG recording was performed during wakefulness. Predominant activity consisted of low amplitude diffuse 15-20 Hz symmetrical beta activity. Photic stimulation produces symmetrical occipital driving response. Relation was not performed. No epileptiform discharges were recorded. There was no abnormal slowing of cerebral activity.  Interpretation: This is a normal EEG recording during wakefulness. No evidence of an epileptic disorder was demonstrated. The predominance of low amplitude beta activity is likely due to use of sedating medications, particularly benzodiazepines.   Rush Farmer M.D. [facility] [phone]

## 2014-08-14 NOTE — Progress Notes (Signed)
EEG completed, results pending. 

## 2014-08-14 NOTE — Discharge Summary (Addendum)
Physician Discharge Summary  BAYLIE DRAKES SEG:[id number] DOB: 1946-02-21 DOA: 08/13/2014  PCP: Mayra Neer, MD  Admit date: 08/13/2014 Discharge date: 08/14/2014  Time spent: 25 * minutes  Recommendations for Outpatient Follow-up:  1. *Follow up PCP in 2 weeks Discharge Diagnoses:  Principal Problem:   Aphasia Active Problems:   Left-sided headache   Essential hypertension   Hypercholesteremia   Discharge Condition: Stable  Diet recommendation: Low fat diet  Filed Weights    History of present illness:  69 y.o. female with a past medical history of hypertension, depression, who was in her usual state of health till yesterday morning when she woke up, started having headache in the left side of her head. And then she found herself not able to speak appropriately. She was thinking of the right words, but the wrong words would come out. She wasn't making much sense to her husband. Denies any visual disturbances. At that time she did not have any weakness in any one side of the body. She reports that she had a fall on Thursday when coming down the stairs. Denies any other injuries. No fever but had some chills. And then today she woke up and experienced some numbness in the right hand. Denies any weakness. And at that time, she decided to come into the hospital. No difficulty swallowing. She also tells me that about 2 weeks ago she took herself off of sertraline by weaning. And then 2 days ago she took started taking that medicine again. Denies any seizure type activity. Continues to have a headache on the left side of her head. Headache is about 6 out of 10 in intensity, aching pain without radiation.  Hospital Course:   TIA- patient's symptoms resolved after and 10 minutes, currently she is back to baseline. Dose of aspirin has been increased to 325 mg by mouth daily. Patient will be discharged on this dose she has been seen by neurology although workup has been negative. Carotid  Dopplers showed 1-39% ICA stenosis, echocardiogram showed EF 50-60% with grade 1 diastolic dysfunction. MRI shows no stroke. MRA showed no flow-limiting stenosis. Hemoglobin A1c 5.7. Patient was seen by physical therapy and no limitation of patient's mobility was noted and no physical therapy was recommended. EEG was obtained which was negative for any focus of seizure  Hyperlipidemia- patient's LDL 116, goal less than 70 Pravachol increased to 80 mg by mouth daily  Hypertension- continue home medications  Procedures:  Carotid duplex  Echocardiogram  Consultations:  Neurology  Discharge Exam: Filed Vitals:   08/14/14 1717  BP: 129/51  Pulse: 78  Temp: 97.7 F (36.5 C)  Resp: 18    General: Appears in no acute distress Cardiovascular: S1-S2 regular Respiratory: Clear to auscultation bilaterally  Discharge Instructions   Discharge Instructions    Diet - low sodium heart healthy    Complete by:  As directed      Increase activity slowly    Complete by:  As directed           Current Discharge Medication List    CONTINUE these medications which have CHANGED   Details  aspirin 325 MG tablet Take 1 tablet (325 mg total) by mouth daily. Qty: 30 tablet, Refills: 2    pravastatin (PRAVACHOL) 80 MG tablet Take 1 tablet (80 mg total) by mouth daily. Qty: 30 tablet, Refills: 2      CONTINUE these medications which have NOT CHANGED   Details  acetaminophen (TYLENOL) 500 MG tablet Take 1,000 mg  by mouth every 6 (six) hours as needed for mild pain.    diazepam (VALIUM) 5 MG tablet Take 5 mg by mouth every 6 (six) hours as needed for anxiety.  Refills: 0    lisinopril (PRINIVIL,ZESTRIL) 20 MG tablet Take 20 mg by mouth daily. Refills: 0    sertraline (ZOLOFT) 100 MG tablet Take 100 mg by mouth daily.  Refills: 0       Allergies  Allergen Reactions  . Sulfa Antibiotics     Cant remember      The results of significant diagnostics from this hospitalization  (including imaging, microbiology, ancillary and laboratory) are listed below for reference.    Significant Diagnostic Studies: Ct Head Wo Contrast  08/13/2014   CLINICAL DATA:  69 year old female with slurred speech and dysarthria for several weeks  EXAM: CT HEAD WITHOUT CONTRAST  TECHNIQUE: Contiguous axial images were obtained from the base of the skull through the vertex without intravenous contrast.  COMPARISON:  None.  FINDINGS: Negative for acute intracranial hemorrhage, acute infarction, mass, mass effect, hydrocephalus or midline shift. Gray-white differentiation is preserved throughout. Minimal cerebral cortical atrophy for age. Very mild periventricular white matter hypoattenuation consistent with chronic microvascular ischemic disease. Less specific subcortical white matter hypoattenuation noted in the posterior right parietal lobe (image 19 series 3) and periventricular left occipital lobe (image 14 series 3).  IMPRESSION: 1. No acute intracranial abnormality. 2. Nonspecific subcortical white matter hypoattenuation in the posterior right parietal and periventricular left occipital lobes. The finding is nonspecific but can be seen in the setting of chronic microvascular ischemia, a demyelinating process such as multiple sclerosis, vasculitis, complicated migraine headaches, or as the sequelae of a prior infectious or inflammatory process. 3. Very mild atrophy and chronic microvascular ischemic white matter disease.   Electronically Signed   By: Jacqulynn Cadet M.D.   On: 08/13/2014 08:25   Mri Brain Without Contrast  08/13/2014   CLINICAL DATA:  LEFT-sided headaches. Difficulty speaking. Transient aphasia. RIGHT hand numbness. Initial encounter.  EXAM: MRI HEAD WITHOUT CONTRAST  MRA HEAD WITHOUT CONTRAST  TECHNIQUE: Multiplanar, multiecho pulse sequences of the brain and surrounding structures were obtained without intravenous contrast. Angiographic images of the head were obtained using MRA  technique without contrast.  COMPARISON:  CT head earlier today.  FINDINGS: MRI HEAD FINDINGS  No evidence for acute infarction, hemorrhage, mass lesion, hydrocephalus, or extra-axial fluid. Moderate cerebral and cerebellar atrophy. Mild subcortical and periventricular T2 and FLAIR hyperintensities, likely chronic microvascular ischemic change. No midline abnormality. Flow voids are maintained throughout the carotid, basilar, and vertebral arteries. There are no areas of chronic hemorrhage. Visualized calvarium, skull base, and upper cervical osseous structures unremarkable. Scalp and extracranial soft tissues, orbits, sinuses, and mastoids show no acute process.  MRA HEAD FINDINGS  Minor irregularity in atheromatous outpouching from the cavernous ICA on the RIGHT. No flow-limiting stenosis.  Similar atheromatous outpouching from the cavernous ICA on the LEFT. No focal narrowing or irregularity. Both ICA termini are widely patent.  Basilar artery widely patent. LEFT vertebral is dominant but the RIGHT vertebral does contribute to its formation.  No ACA, MCA, or PCA proximal lesion. No cerebellar branch occlusion. No intracranial berry aneurysm.  IMPRESSION: No acute intracranial findings.  Chronic changes as described.  No flow-limiting anterior or posterior circulation lesion. Minor BILATERAL cavernous carotid atheromatous change.   Electronically Signed   By: Rolla Flatten M.D.   On: 08/13/2014 18:12   Mr Jodene Nam Head/brain Wo Cm  08/13/2014  CLINICAL DATA:  LEFT-sided headaches. Difficulty speaking. Transient aphasia. RIGHT hand numbness. Initial encounter.  EXAM: MRI HEAD WITHOUT CONTRAST  MRA HEAD WITHOUT CONTRAST  TECHNIQUE: Multiplanar, multiecho pulse sequences of the brain and surrounding structures were obtained without intravenous contrast. Angiographic images of the head were obtained using MRA technique without contrast.  COMPARISON:  CT head earlier today.  FINDINGS: MRI HEAD FINDINGS  No evidence for  acute infarction, hemorrhage, mass lesion, hydrocephalus, or extra-axial fluid. Moderate cerebral and cerebellar atrophy. Mild subcortical and periventricular T2 and FLAIR hyperintensities, likely chronic microvascular ischemic change. No midline abnormality. Flow voids are maintained throughout the carotid, basilar, and vertebral arteries. There are no areas of chronic hemorrhage. Visualized calvarium, skull base, and upper cervical osseous structures unremarkable. Scalp and extracranial soft tissues, orbits, sinuses, and mastoids show no acute process.  MRA HEAD FINDINGS  Minor irregularity in atheromatous outpouching from the cavernous ICA on the RIGHT. No flow-limiting stenosis.  Similar atheromatous outpouching from the cavernous ICA on the LEFT. No focal narrowing or irregularity. Both ICA termini are widely patent.  Basilar artery widely patent. LEFT vertebral is dominant but the RIGHT vertebral does contribute to its formation.  No ACA, MCA, or PCA proximal lesion. No cerebellar branch occlusion. No intracranial berry aneurysm.  IMPRESSION: No acute intracranial findings.  Chronic changes as described.  No flow-limiting anterior or posterior circulation lesion. Minor BILATERAL cavernous carotid atheromatous change.   Electronically Signed   By: Rolla Flatten M.D.   On: 08/13/2014 18:12    Microbiology: No results found for this or any previous visit (from the past 240 hour(s)).   Labs: Basic Metabolic Panel:  Recent Labs Lab 08/13/14 0715 08/13/14 0741  NA 137 138  K 3.6 3.6  CL 101 102  CO2 25  --   GLUCOSE 104* 107*  BUN 13 13  CREATININE 0.62 0.60  CALCIUM 9.9  --    Liver Function Tests:  Recent Labs Lab 08/13/14 0715  AST 29  ALT 21  ALKPHOS 65  BILITOT 0.8  PROT 8.3  ALBUMIN 4.5   No results for input(s): LIPASE, AMYLASE in the last 168 hours. No results for input(s): AMMONIA in the last 168 hours. CBC:  Recent Labs Lab 08/13/14 0715 08/13/14 0741  WBC 13.6*  --    NEUTROABS 9.9*  --   HGB 14.1 16.3*  HCT 43.4 48.0*  MCV 94.6  --   PLT 616*  --    Cardiac Enzymes: No results for input(s): CKTOTAL, CKMB, CKMBINDEX, TROPONINI in the last 168 hours. BNP: BNP (last 3 results) No results for input(s): BNP in the last 8760 hours.  ProBNP (last 3 results) No results for input(s): PROBNP in the last 8760 hours.  CBG: No results for input(s): GLUCAP in the last 168 hours.     SignedEleonore Chiquito S  [facility] 08/14/2014, 5:46 PM

## 2014-08-14 NOTE — Progress Notes (Signed)
Discharge instructions reviewed with patient/family. All questions answered at this time. Transport by family.   Ave Filter, RN

## 2014-08-14 NOTE — Progress Notes (Signed)
VASCULAR LAB PRELIMINARY  PRELIMINARY  PRELIMINARY  PRELIMINARY  Carotid Doppler completed.    Preliminary report:  1-39% ICA stenosis.  Vertebral artery flow is antegrade.   Bobbijo Holst, RVT 08/14/2014, 12:12 PM

## 2014-08-14 NOTE — Progress Notes (Signed)
Echocardiogram 2D Echocardiogram has been performed.  Yvonne Rose 08/14/2014, 10:14 AM

## 2014-08-14 NOTE — Progress Notes (Signed)
STROKE TEAM PROGRESS NOTE   HISTORY Yvonne Rose is an 69 y.o. female who reports that when she was about to go to bed last evening she noted a left sided headache (LKW 08/12/2014 at 2200). She was attempting to tell her husband but could not get the words out correctly. She then noted some numbness in her right hand. After about 15 minutes the symptoms resolved an her husband convinced her to not seek medical attention at that time.This morning 08/13/2014 she awakened still concerned and presented for evaluation. She has not had recurrence of her symptoms. Patient was not administered TPA secondary to Resolution of symptoms. She was admitted for further evaluation and treatment.   SUBJECTIVE (INTERVAL HISTORY) Her husband is at the bedside.  Overall she feels her condition is rapidly improving. She reports a severe headache the night her symptoms started. Her husband states it lasted for a while, into the next day, that is was so severe. She has no memory of her headache like her husband reports. She does not know why she can't remember.  She remembers she could not say what she wanted to say.   OBJECTIVE Temp:  [98.1 F (36.7 C)-98.9 F (37.2 C)] 98.4 F (36.9 C) (02/29 1012) Pulse Rate:  [69-86] 85 (02/29 1012) Cardiac Rhythm:  [-] Normal sinus rhythm (02/29 0800) Resp:  [15-18] 18 (02/29 1012) BP: (113-144)/(48-65) 127/51 mmHg (02/29 1012) SpO2:  [95 %-100 %] 95 % (02/29 1012)  No results for input(s): GLUCAP in the last 168 hours.  Recent Labs Lab 08/13/14 0715 08/13/14 0741  NA 137 138  K 3.6 3.6  CL 101 102  CO2 25  --   GLUCOSE 104* 107*  BUN 13 13  CREATININE 0.62 0.60  CALCIUM 9.9  --     Recent Labs Lab 08/13/14 0715  AST 29  ALT 21  ALKPHOS 65  BILITOT 0.8  PROT 8.3  ALBUMIN 4.5    Recent Labs Lab 08/13/14 0715 08/13/14 0741  WBC 13.6*  --   NEUTROABS 9.9*  --   HGB 14.1 16.3*  HCT 43.4 48.0*  MCV 94.6  --   PLT 616*  --    No results for  input(s): CKTOTAL, CKMB, CKMBINDEX, TROPONINI in the last 168 hours.  Recent Labs  08/13/14 0715  LABPROT 13.5  INR 1.02    Recent Labs  08/13/14 1105  COLORURINE YELLOW  LABSPEC 1.012  PHURINE 6.0  GLUCOSEU NEGATIVE  HGBUR NEGATIVE  BILIRUBINUR NEGATIVE  KETONESUR 40*  PROTEINUR NEGATIVE  UROBILINOGEN 1.0  NITRITE NEGATIVE  LEUKOCYTESUR NEGATIVE       Component Value Date/Time   CHOL 194 08/14/2014 0620   TRIG 100 08/14/2014 0620   HDL 58 08/14/2014 0620   CHOLHDL 3.3 08/14/2014 0620   VLDL 20 08/14/2014 0620   LDLCALC 116* 08/14/2014 0620   Lab Results  Component Value Date   HGBA1C 5.7* 08/13/2014      Component Value Date/Time   LABOPIA NONE DETECTED 08/14/2014 0800   COCAINSCRNUR NONE DETECTED 08/14/2014 0800   LABBENZ POSITIVE* 08/14/2014 0800   AMPHETMU NONE DETECTED 08/14/2014 0800   THCU NONE DETECTED 08/14/2014 0800   LABBARB NONE DETECTED 08/14/2014 0800     Recent Labs Lab 08/13/14 0715  ETH <5    Ct Head Wo Contrast 08/13/2014    1. No acute intracranial abnormality. 2. Nonspecific subcortical white matter hypoattenuation in the posterior right parietal and periventricular left occipital lobes. The finding is nonspecific but can  be seen in the setting of chronic microvascular ischemia, a demyelinating process such as multiple sclerosis, vasculitis, complicated migraine headaches, or as the sequelae of a prior infectious or inflammatory process. 3. Very mild atrophy and chronic microvascular ischemic white matter disease.     Mri Brain Without Contrast 08/13/2014    No acute intracranial findings.  Chronic changes   Mr Jodene Nam Head/brain Wo Cm 08/13/2014      No flow-limiting anterior or posterior circulation lesion. Minor BILATERAL cavernous carotid atheromatous change.     Carotid Doppler  There is 1-39% bilateral ICA stenosis. Vertebral artery flow is antegrade.    2D Echocardiogram  - Normal LV size with mild LV hypertrophy. EF 55-60%. Normal  RV size and systolic function. No significant valvular abnormalities.  EEG This is a normal EEG recording during wakefulness. No evidence of an epileptic disorder was demonstrated. The predominance of low amplitude beta activity is likely due to use of sedating medications, particularly benzodiazepines.   PHYSICAL EXAM  Temp:  [97.7 F (36.5 C)-98.4 F (36.9 C)] 97.7 F (36.5 C) (02/29 1717) Pulse Rate:  [78-85] 78 (02/29 1717) Resp:  [18] 18 (02/29 1717) BP: (127-142)/(51-71) 129/51 mmHg (02/29 1717) SpO2:  [95 %-99 %] 99 % (02/29 1717)  General - Well nourished, well developed, in no apparent distress.  Ophthalmologic - Sharp disc margins OU.  Cardiovascular - Regular rate and rhythm with no murmur.  Mental Status -  Level of arousal and orientation to time, place, and person were intact. Language including expression, naming, repetition, comprehension, reading, and writing was assessed and found intact. Attention span and concentration were normal. Recent and remote memory were 3/3 registration and 1/3 delayed recall. Fund of Knowledge was assessed and was intact.  Cranial Nerves II - XII - II - Visual field intact OU. III, IV, VI - Extraocular movements intact. V - Facial sensation intact bilaterally. VII - Facial movement intact bilaterally. VIII - Hearing & vestibular intact bilaterally. X - Palate elevates symmetrically. XI - Chin turning & shoulder shrug intact bilaterally. XII - Tongue protrusion intact.  Motor Strength - The patient's strength was normal in all extremities and pronator drift was absent.  Bulk was normal and fasciculations were absent.   Motor Tone - Muscle tone was assessed at the neck and appendages and was normal.  Reflexes - The patient's reflexes were normal in all extremities and she had no pathological reflexes.  Sensory - Light touch, temperature/pinprick were assessed and were normal except right hand decreased light touch.     Coordination - The patient had normal movements in the hands and feet with no ataxia or dysmetria.  Tremor was absent.  Gait and Station - The patient's transfers, posture, gait, station, and turns were observed as normal.   ASSESSMENT/PLAN Ms. Yvonne Rose is a 69 y.o. female with history of hypertension and depression presenting with difficulty with speech and right hand numbness. She did not receive IV t-PA due to resolution of symptoms.   Complicated migraine most likely but TIA not able to exclude  No Resultant  Neuro deficits  MRI  Negative for stroke  MRA  No flow limiting stenosis  Carotid Doppler  No significant stenosis   2D Echo  Unremarkable  EEG negative    HgbA1c 5.7  Lovenox 40 mg sq daily for VTE prophylaxis  Diet Heart thin liquids  aspirin 81 mg orally every day prior to admission, now on aspirin 325 mg orally every day  Ongoing aggressive  stroke risk factor management  Therapy recommendations:  pending   Disposition:  Anticipate return home  Hypertension  Stable  Patient counseled to be compliant with her blood pressure medications  Hyperlipidemia  Home meds:  pravachol 40, resumed in hospital  LDL 116, goal < 70  Can increase or change - has taken 80 mg previously and asked to decrease (she cannot remember why she requested, her huband thinks it was because she was taking 2 pills and it costs twice as much). Will increase dose to 80. She is agreeable  Continue statin at discharge  Other Stroke Risk Factors  Advanced age  ETOH use - 4-6 beers a day. On CIWA protocol   [facility] day # [place] for Pager information 08/14/2014 12:19 PM   I, the attending vascular neurologist, have personally obtained a history, examined the patient, evaluated laboratory data, individually viewed imaging studies and agree with radiology interpretations. I also obtained additional history from  pt's husband at bedside.Together with the NP/PA, we formulated the assessment and plan of care which reflects our mutual decision.  I have made any additions or clarifications directly to the above note and agree with the findings and plan as currently documented.   69 yo F with hx of HTN and HLD and anxiety admitted for speech difficulty and right hand numbness. MRI negative, still has right hand decreased light touch, which did not fit to stroke or TIA picture. These symptoms came after a severe headache, most likely pt had complicated migraine with neuro symptoms. Stroke work up negative except LDL 116. Increase statin dose. Neuro sigh off. No indication for neuro follow up.    Yvonne Hawking, MD PhD Stroke Neurology 08/15/2014 7:53 AM       To contact Stroke Continuity provider, please refer to [email or web address] After hours, contact General Neurology

## 2014-08-14 NOTE — Progress Notes (Signed)
Pt discharged via wheelchair. Care plans and education complete. Pt A&O, VSS and no c/o pain. No signs of respiratory distress and no issues at this time. Husband arrived to pick up pt and is discharging home. IV removed with no complications. Pt tolerated well and understands all education provided and taught. Leanne Chang, RN

## 2014-08-15 DIAGNOSIS — G43109 Migraine with aura, not intractable, without status migrainosus: Secondary | ICD-10-CM | POA: Insufficient documentation

## 2014-08-15 NOTE — Progress Notes (Signed)
RETRO UR COMPLETED

## 2014-09-11 ENCOUNTER — Telehealth: Payer: Self-pay | Admitting: Hematology

## 2014-09-11 NOTE — Telephone Encounter (Signed)
Called pt and scheduled new pt appt.   Feng 10/16/14 10:30 am  Dx: Elevated Platelets Referring: Dr. Brigitte Pulse

## 2014-10-16 ENCOUNTER — Telehealth: Payer: Self-pay | Admitting: Hematology

## 2014-10-16 ENCOUNTER — Ambulatory Visit: Payer: PPO

## 2014-10-16 ENCOUNTER — Encounter: Payer: Self-pay | Admitting: Hematology

## 2014-10-16 ENCOUNTER — Ambulatory Visit (HOSPITAL_BASED_OUTPATIENT_CLINIC_OR_DEPARTMENT_OTHER): Payer: PPO | Admitting: Hematology

## 2014-10-16 ENCOUNTER — Other Ambulatory Visit (HOSPITAL_BASED_OUTPATIENT_CLINIC_OR_DEPARTMENT_OTHER): Payer: PPO

## 2014-10-16 VITALS — BP 153/68 | HR 82 | Temp 98.1°F | Resp 18 | Ht 65.0 in | Wt 146.9 lb

## 2014-10-16 DIAGNOSIS — D473 Essential (hemorrhagic) thrombocythemia: Secondary | ICD-10-CM

## 2014-10-16 DIAGNOSIS — D75839 Thrombocytosis, unspecified: Secondary | ICD-10-CM

## 2014-10-16 LAB — LACTATE DEHYDROGENASE (CC13): LDH: 212 U/L (ref 125–245)

## 2014-10-16 LAB — COMPREHENSIVE METABOLIC PANEL (CC13)
ALT: 21 U/L (ref 0–55)
AST: 26 U/L (ref 5–34)
Albumin: 4.4 g/dL (ref 3.5–5.0)
Alkaline Phosphatase: 69 U/L (ref 40–150)
Anion Gap: 10 mEq/L (ref 3–11)
BUN: 22.3 mg/dL (ref 7.0–26.0)
CO2: 26 mEq/L (ref 22–29)
Calcium: 10.2 mg/dL (ref 8.4–10.4)
Chloride: 106 mEq/L (ref 98–109)
Creatinine: 0.8 mg/dL (ref 0.6–1.1)
EGFR: 78 mL/min/{1.73_m2} — ABNORMAL LOW (ref >90)
Glucose: 104 mg/dl (ref 70–140)
Potassium: 4.4 mEq/L (ref 3.5–5.1)
Sodium: 141 mEq/L (ref 136–145)
Total Bilirubin: 0.34 mg/dL (ref 0.20–1.20)
Total Protein: 7.9 g/dL (ref 6.4–8.3)

## 2014-10-16 LAB — IRON AND TIBC CHCC
%SAT: 24 % (ref 21–57)
Iron: 89 ug/dL (ref 41–142)
TIBC: 370 ug/dL (ref 236–444)
UIBC: 282 ug/dL (ref 120–384)

## 2014-10-16 LAB — CBC & DIFF AND RETIC
BASO%: 0.3 % (ref 0.0–2.0)
Basophils Absolute: 0 10*3/uL (ref 0.0–0.1)
EOS%: 2.3 % (ref 0.0–7.0)
Eosinophils Absolute: 0.2 10*3/uL (ref 0.0–0.5)
HCT: 40 % (ref 34.8–46.6)
HGB: 13.3 g/dL (ref 11.6–15.9)
Immature Retic Fract: 8.6 % (ref 1.60–10.00)
LYMPH%: 23.7 % (ref 14.0–49.7)
MCH: 31.4 pg (ref 25.1–34.0)
MCHC: 33.3 g/dL (ref 31.5–36.0)
MCV: 94.6 fL (ref 79.5–101.0)
MONO#: 0.7 10*3/uL (ref 0.1–0.9)
MONO%: 8.4 % (ref 0.0–14.0)
NEUT#: 5.7 10*3/uL (ref 1.5–6.5)
NEUT%: 65.3 % (ref 38.4–76.8)
Platelets: 559 10*3/uL — ABNORMAL HIGH (ref 145–400)
RBC: 4.23 10*6/uL (ref 3.70–5.45)
RDW: 15.2 % — ABNORMAL HIGH (ref 11.2–14.5)
Retic %: 1.51 % (ref 0.70–2.10)
Retic Ct Abs: 63.87 10*3/uL (ref 33.70–90.70)
WBC: 8.7 10*3/uL (ref 3.9–10.3)
lymph#: 2.1 10*3/uL (ref 0.9–3.3)

## 2014-10-16 LAB — FERRITIN CHCC: Ferritin: 66 ng/ml (ref 9–269)

## 2014-10-16 LAB — MORPHOLOGY: PLT EST: INCREASED

## 2014-10-16 NOTE — Progress Notes (Signed)
[place]  Telephone:([phone]) [phone] Fax:([phone]) [place] Note   Patient Care Team: Mayra Neer, MD as PCP - General (Family Medicine) 10/16/2014  CHIEF COMPLAINTS/PURPOSE OF CONSULTATION:  Thrombocytosis  HISTORY OF PRESENTING ILLNESS:  Yvonne Rose 69 y.o. female with past medical history of hypertension and anxiety, recent episode of TIA versus complicated migraine, is here because of thrombocytosis.  She had a mechanical fall at home in mid Feb, 2016. She slipped on the front steps of her house in a raining day. She had headaches after worse. She did not seek immediate medical attention. She presents to [facility] with sudden onset aphasia and had right arm weakness, which last for about 10 mins and spontaneously resolved. Her headaches got worse during the episode also. She denies any vision change or other neurological symptoms. She was evaluated in the hospital included CT brain, MRI brain, echocardiogram, carotid Doppler and labs that a wall or unremarkable except for elevated plt count at 616K. she was evaluated by neurology and subsequently discharged home.   She has had elevated platelete since 2013,  with platelet count 487K. Her WBC and hemoglobin has been normal. Her repeated a CBC in July 2013, August 2016 and March 2016 showed gradually increased platelet count at [place], 543K and a 603K. She has been asymptomatic. She denies any other episodes of thrombosis. She feels well overall, denies any symptoms. She lives with her husband and remains to be physically active at home.  MEDICAL HISTORY:  Past Medical History  Diagnosis Date  . Hypertension   . Anxiety   . PONV (postoperative nausea and vomiting)     SURGICAL HISTORY: Past Surgical History  Procedure Laterality Date  . Back surgery    . Dilation and curettage of uterus    . Abdominal hysterectomy      SOCIAL HISTORY: History   Social History  . Marital Status:  Married    Spouse Name: N/A  . Number of Children: N/A  . Years of Education: N/A   Occupational History  . Not on file.   Social History Main Topics  . Smoking status: Never Smoker   . Smokeless tobacco: Not on file  . Alcohol Use: 3.6 oz/week    6 Cans of beer per week  . Drug Use: No  . Sexual Activity: Yes    Birth Control/ Protection: Post-menopausal   Other Topics Concern  . Not on file   Social History Narrative  . No narrative on file    FAMILY HISTORY: Family History  Problem Relation Age of Onset  . Aneurysm Mother   . Hypertension Father   . Alcoholism Father     ALLERGIES:  is allergic to sulfa antibiotics.  MEDICATIONS:  Current Outpatient Prescriptions  Medication Sig Dispense Refill  . acetaminophen (TYLENOL) 500 MG tablet Take 500 mg by mouth every 8 (eight) hours as needed for mild pain.     Marland Kitchen aspirin 325 MG tablet Take 1 tablet (325 mg total) by mouth daily. 30 tablet 2  . diazepam (VALIUM) 5 MG tablet Take 5 mg by mouth every 6 (six) hours as needed for anxiety.   0  . diphenhydrAMINE (SOMINEX) 25 MG tablet Take 25 mg by mouth at bedtime as needed for sleep.    Marland Kitchen lisinopril (PRINIVIL,ZESTRIL) 20 MG tablet Take 20 mg by mouth daily.  0  . milk thistle 175 MG tablet Take 175 mg by mouth daily.    . Multiple  Vitamins-Minerals (MULTIVITAMIN WITH MINERALS) tablet Take 1 tablet by mouth daily.    . pravastatin (PRAVACHOL) 80 MG tablet Take 1 tablet (80 mg total) by mouth daily. 30 tablet 2  . sertraline (ZOLOFT) 100 MG tablet Take 100 mg by mouth daily.   0   No current facility-administered medications for this visit.    REVIEW OF SYSTEMS:   Constitutional: Denies fevers, chills or abnormal night sweats Eyes: Denies blurriness of vision, double vision or watery eyes Ears, nose, mouth, throat, and face: Denies mucositis or sore throat Respiratory: Denies cough, dyspnea or wheezes Cardiovascular: Denies palpitation, chest discomfort or lower  extremity swelling Gastrointestinal:  Denies nausea, heartburn or change in bowel habits Skin: Denies abnormal skin rashes Lymphatics: Denies new lymphadenopathy or easy bruising Neurological:Denies numbness, tingling or new weaknesses Behavioral/Psych: Mood is stable, no new changes  All other systems were reviewed with the patient and are negative.  PHYSICAL EXAMINATION: ECOG PERFORMANCE STATUS: 0 - Asymptomatic  Filed Vitals:   10/16/14 1028  BP: 153/68  Pulse: 82  Temp: 98.1 F (36.7 C)  Resp: 18   Filed Weights   10/16/14 1028  Weight: 146 lb 14.4 oz (66.633 kg)    GENERAL:alert, no distress and comfortable SKIN: skin color, texture, turgor are normal, no rashes or significant lesions EYES: normal, conjunctiva are pink and non-injected, sclera clear OROPHARYNX:no exudate, no erythema and lips, buccal mucosa, and tongue normal  NECK: supple, thyroid normal size, non-tender, without nodularity LYMPH:  no palpable lymphadenopathy in the cervical, axillary or inguinal LUNGS: clear to auscultation and percussion with normal breathing effort HEART: regular rate & rhythm and no murmurs and no lower extremity edema ABDOMEN:abdomen soft, non-tender and normal bowel sounds Musculoskeletal:no cyanosis of digits and no clubbing  PSYCH: alert & oriented x 3 with fluent speech NEURO: no focal motor/sensory deficits  LABORATORY DATA:  I have reviewed the data as listed Lab Results  Component Value Date   WBC 13.6* 08/13/2014   HGB 16.3* 08/13/2014   HCT 48.0* 08/13/2014   MCV 94.6 08/13/2014   PLT 616* 08/13/2014    Recent Labs  08/13/14 0715 08/13/14 0741  NA 137 138  K 3.6 3.6  CL 101 102  CO2 25  --   GLUCOSE 104* 107*  BUN 13 13  CREATININE 0.62 0.60  CALCIUM 9.9  --   GFRNONAA 89*  --   GFRAA >90  --   PROT 8.3  --   ALBUMIN 4.5  --   AST 29  --   ALT 21  --   ALKPHOS 65  --   BILITOT 0.8  --     RADIOGRAPHIC STUDIES: I have personally reviewed the  radiological images as listed and agreed with the findings in the report. No results found.  ASSESSMENT & PLAN:  69 year old Caucasian female, with past medical history of hypertension, anxiety, recent episode of TIA, was negative workup except elevated platelet count.  1. Thrombocytosis, likely essential thrombocythemia -I reviewed her outside lab sisters on 13, her platelet count has been slowly trending up since then. The rest of her CBC are normal. -She does not appear to have obvious cause for reactive thrombocytosis, such as iron deficient anemia, chronic infection or chronic inflammation. -I think she likely has essential thrombocythemia, or other myeloproliferative neoplasm such as myelofibrosis. She does not have significant elevated WBC or hemoglobin -I'll repeat her CBC with differential, reticular count, CMP, and obtain JAK2 mutation -I recommend her to get a bone marrow  biopsy to ruled out MPN, I'll arrange the procedure through interventional radiology. She agrees.  -I'll also obtain a ultrasound of abdomen to a value to her liver and spleen -I'll see her back after her bone marrow biopsy. -She will continue her daily aspirin 325 mg -We discussed the risk of thrombosis, including stroke and heart attack with thrombocytosis.  2. Hypertension and anxiety -She will continue follow-up with her primary care physician  Plan -lab test today -IR bone marrow biopsy  -[place] OF ABDOMEN -RTC in 3 weeks    All questions were answered. The patient knows to call the clinic with any problems, questions or concerns. I spent 40 minutes counseling the patient face to face. The total time spent in the appointment was 55 minutes and more than 50% was on counseling.     Truitt Merle, MD 10/16/2014 10:47 AM

## 2014-10-16 NOTE — Telephone Encounter (Signed)
per pof to sch pt appt-adv pt that [facility] will call to sch [place] & IR-gave pt copy of sch-lab add on

## 2014-10-16 NOTE — Progress Notes (Signed)
Checked in new pt with no financial concerns at this time.  Pt has my card for any billing questions or concerns. °

## 2014-10-19 ENCOUNTER — Other Ambulatory Visit: Payer: Self-pay | Admitting: *Deleted

## 2014-10-19 ENCOUNTER — Telehealth: Payer: Self-pay | Admitting: *Deleted

## 2014-10-19 NOTE — Telephone Encounter (Signed)
TC from patient this morning regarding bone marrow biopsy set for next Tuesday, 10/24/14. She also has U/S of spleen scheduled for Monday 10/23/14. Ms. Wilbourn would like to postpone Bone Marrow biopsy due to financial concerns. It requires $200 co-pay which she does not have right now. She wants to know how essential the biopsy is right now as it puts a financial burden on her. She is ok with with $75 co-pay for U/S, but cannot do both back-to-back. Please advise.

## 2014-10-19 NOTE — Telephone Encounter (Signed)
Dr. Burr Medico talked with pt & U/S cancelled.  Pt will do BMBX instead.  Radiology notified to cancel U/S per Dr Burr Medico.

## 2014-10-20 ENCOUNTER — Encounter: Payer: Self-pay | Admitting: Hematology

## 2014-10-20 NOTE — Telephone Encounter (Signed)
I called her home and a mobile number, and left a message regarding her request for lab test results. I encouraged her to use my chart to access her lab results, and I'll explain her all lab results when I see her next time.  Yvonne Rose  10/20/2014

## 2014-10-23 ENCOUNTER — Other Ambulatory Visit: Payer: Self-pay | Admitting: Radiology

## 2014-10-23 ENCOUNTER — Ambulatory Visit (HOSPITAL_COMMUNITY): Admission: RE | Admit: 2014-10-23 | Payer: PPO | Source: Ambulatory Visit

## 2014-10-24 ENCOUNTER — Encounter (HOSPITAL_COMMUNITY): Payer: Self-pay

## 2014-10-24 ENCOUNTER — Ambulatory Visit (HOSPITAL_COMMUNITY)
Admission: RE | Admit: 2014-10-24 | Discharge: 2014-10-24 | Disposition: A | Discharge status: Home / Self Care | Payer: PPO | Source: Ambulatory Visit | Attending: Hematology | Admitting: Hematology

## 2014-10-24 DIAGNOSIS — D75839 Thrombocytosis, unspecified: Secondary | ICD-10-CM | POA: Insufficient documentation

## 2014-10-24 DIAGNOSIS — D473 Essential (hemorrhagic) thrombocythemia: Secondary | ICD-10-CM | POA: Diagnosis present

## 2014-10-24 LAB — CBC WITH DIFFERENTIAL/PLATELET
Basophils Absolute: 0 10*3/uL (ref 0.0–0.1)
Basophils Relative: 1 % (ref 0–1)
Eosinophils Absolute: 0.3 10*3/uL (ref 0.0–0.7)
Eosinophils Relative: 3 % (ref 0–5)
HCT: 40.5 % (ref 36.0–46.0)
Hemoglobin: 13.1 g/dL (ref 12.0–15.0)
Lymphocytes Relative: 27 % (ref 12–46)
Lymphs Abs: 2.1 10*3/uL (ref 0.7–4.0)
MCH: 30.8 pg (ref 26.0–34.0)
MCHC: 32.3 g/dL (ref 30.0–36.0)
MCV: 95.1 fL (ref 78.0–100.0)
Monocytes Absolute: 0.6 10*3/uL (ref 0.1–1.0)
Monocytes Relative: 8 % (ref 3–12)
Neutro Abs: 4.9 10*3/uL (ref 1.7–7.7)
Neutrophils Relative %: 61 % (ref 43–77)
Platelets: 573 10*3/uL — ABNORMAL HIGH (ref 150–400)
RBC: 4.26 MIL/uL (ref 3.87–5.11)
RDW: 15 % (ref 11.5–15.5)
WBC: 8 10*3/uL (ref 4.0–10.5)

## 2014-10-24 LAB — APTT: aPTT: 30 seconds (ref 24–37)

## 2014-10-24 LAB — PROTIME-INR
INR: 0.99 (ref 0.00–1.49)
Prothrombin Time: 13.2 seconds (ref 11.6–15.2)

## 2014-10-24 LAB — BONE MARROW EXAM

## 2014-10-24 MED ORDER — MIDAZOLAM HCL 2 MG/2ML IJ SOLN
INTRAMUSCULAR | Status: AC | PRN
Start: 2014-10-24 — End: 2014-10-24
  Administered 2014-10-24: 0.5 mg via INTRAVENOUS
  Administered 2014-10-24: 1 mg via INTRAVENOUS

## 2014-10-24 MED ORDER — FENTANYL CITRATE (PF) 100 MCG/2ML IJ SOLN
INTRAMUSCULAR | Status: AC
  Filled 2014-10-24: qty 2

## 2014-10-24 MED ORDER — FENTANYL CITRATE (PF) 100 MCG/2ML IJ SOLN
INTRAMUSCULAR | Status: AC | PRN
  Administered 2014-10-24: 25 ug via INTRAVENOUS
  Administered 2014-10-24: 50 ug via INTRAVENOUS

## 2014-10-24 MED ORDER — MIDAZOLAM HCL 2 MG/2ML IJ SOLN
INTRAMUSCULAR | Status: AC
  Filled 2014-10-24: qty 4

## 2014-10-24 MED ORDER — SODIUM CHLORIDE 0.9 % IV SOLN
INTRAVENOUS | Status: DC
  Administered 2014-10-24: 07:00:00 via INTRAVENOUS

## 2014-10-24 MED ORDER — HYDROCODONE-ACETAMINOPHEN 5-325 MG PO TABS: 1.0000 | ORAL_TABLET | ORAL | Status: DC | PRN

## 2014-10-24 NOTE — H&P (Signed)
Chief Complaint: Thrombocytosis  Referring Physician(s): Feng,Yan  History of Present Illness: Yvonne Rose is a 69 y.o. female   Known thrombocytosis since 2013 follwed by PMD Recent  (Feb 2016) TIA symptoms of  Rt arm weakness and aphasia x 10 minutes and resolved- no recurrence Noted thrombocytosis gradually increasing-  573 today Now request for Bone Marrow Bx per Dr Burr Medico Worrisome for possible myeloproliferative neoplasm   Past Medical History  Diagnosis Date  . Hypertension   . Anxiety   . PONV (postoperative nausea and vomiting)     Past Surgical History  Procedure Laterality Date  . Back surgery    . Dilation and curettage of uterus    . Abdominal hysterectomy      Allergies: Sulfa antibiotics  Medications: Prior to Admission medications   Medication Sig Start Date End Date Taking? Authorizing Provider  acetaminophen (TYLENOL) 500 MG tablet Take 500 mg by mouth every 8 (eight) hours as needed for mild pain.    Yes Historical Provider, MD  amitriptyline (ELAVIL) 25 MG tablet Take 25 mg by mouth at bedtime.   Yes Historical Provider, MD  aspirin 325 MG tablet Take 1 tablet (325 mg total) by mouth daily. 08/14/14  Yes Oswald Hillock, MD  diazepam (VALIUM) 5 MG tablet Take 5 mg by mouth every 6 (six) hours as needed for anxiety.  07/28/14  Yes Historical Provider, MD  lisinopril (PRINIVIL,ZESTRIL) 20 MG tablet Take 20 mg by mouth every morning.  06/05/14  Yes Historical Provider, MD  milk thistle 175 MG tablet Take 175 mg by mouth daily.   Yes Historical Provider, MD  Multiple Vitamins-Minerals (MULTIVITAMIN WITH MINERALS) tablet Take 1 tablet by mouth daily.   Yes Historical Provider, MD  pravastatin (PRAVACHOL) 80 MG tablet Take 1 tablet (80 mg total) by mouth daily. 08/15/14  Yes Oswald Hillock, MD     Family History  Problem Relation Age of Onset  . Aneurysm Mother   . Hypertension Father   . Alcoholism Father   . Lupus Sister     History   Social  History  . Marital Status: Married    Spouse Name: N/A  . Number of Children: N/A  . Years of Education: N/A   Social History Main Topics  . Smoking status: Never Smoker   . Smokeless tobacco: Not on file  . Alcohol Use: 1.2 - 1.8 oz/week    2-3 Cans of beer per week     Comment: daily   . Drug Use: No  . Sexual Activity: Yes    Birth Control/ Protection: Post-menopausal   Other Topics Concern  . None   Social History Narrative     Review of Systems: A 12 point ROS discussed and pertinent positives are indicated in the HPI above.  All other systems are negative.  Review of Systems  Constitutional: Negative for fever, activity change and appetite change.  Respiratory: Negative for cough and shortness of breath.   Cardiovascular: Negative for chest pain.  Gastrointestinal: Negative for abdominal pain.  Neurological: Negative for dizziness, tremors, seizures, syncope, facial asymmetry, speech difficulty, weakness, light-headedness, numbness and headaches.  Psychiatric/Behavioral: Negative for behavioral problems and confusion.    Vital Signs: BP 127/62 mmHg  Pulse 81  Temp(Src) 97.6 F (36.4 C) (Oral)  Resp 20  Ht 5\' 5"  (1.651 m)  Wt 66.633 kg (146 lb 14.4 oz)  BMI 24.45 kg/m2  SpO2 100%  Physical Exam  Constitutional: She is oriented to person,  place, and time. She appears well-nourished.  Cardiovascular: Normal rate, regular rhythm and normal heart sounds.   No murmur heard. Pulmonary/Chest: Effort normal and breath sounds normal. She has no wheezes.  Abdominal: Soft. Bowel sounds are normal. There is no tenderness.  Musculoskeletal: Normal range of motion.  Neurological: She is alert and oriented to person, place, and time.  Skin: Skin is warm and dry.  Psychiatric: She has a normal mood and affect. Her behavior is normal. Judgment and thought content normal.  Nursing note and vitals reviewed.   Mallampati Score:  MD Evaluation Airway: WNL Heart:  WNL Abdomen: WNL Chest/ Lungs: WNL ASA  Classification: 2 Mallampati/Airway Score: One  Imaging: No results found.  Labs:  CBC:  Recent Labs  08/13/14 0715 08/13/14 0741 10/16/14 1129 10/24/14 0715  WBC 13.6*  --  8.7 8.0  HGB 14.1 16.3* 13.3 13.1  HCT 43.4 48.0* 40.0 40.5  PLT 616*  --  559* 573*    COAGS:  Recent Labs  08/13/14 0715 10/24/14 0715  INR 1.02 0.99  APTT 33 30    BMP:  Recent Labs  08/13/14 0715 08/13/14 0741 10/16/14 1129  NA 137 138 141  K 3.6 3.6 4.4  CL 101 102  --   CO2 25  --  26  GLUCOSE 104* 107* 104  BUN 13 13 22.3  CALCIUM 9.9  --  10.2  CREATININE 0.62 0.60 0.8  GFRNONAA 89*  --   --   GFRAA >90  --   --     LIVER FUNCTION TESTS:  Recent Labs  08/13/14 0715 10/16/14 1129  BILITOT 0.8 0.34  AST 29 26  ALT 21 21  ALKPHOS 65 69  PROT 8.3 7.9  ALBUMIN 4.5 4.4    TUMOR MARKERS: No results for input(s): AFPTM, CEA, CA199, CHROMGRNA in the last 8760 hours.  Assessment and Plan:  Thrombocytosis Gradually worsening since 2013 Recent TIA - now resolved Now scheduled for BM Bx Risks and Benefits discussed with the patient including, but not limited to bleeding, infection, damage to adjacent structures or low yield requiring additional tests. All of the patient's questions were answered, patient is agreeable to proceed. Consent signed and in chart.   Thank you for this interesting consult.  I greatly enjoyed meeting Yvonne Rose and look forward to participating in their care.  Signed: Medora Roorda A 10/24/2014, 8:23 AM   I spent a total of  20 Minutes   in face to face in clinical consultation, greater than 50% of which was counseling/coordinating care for BM bx

## 2014-10-24 NOTE — Discharge Instructions (Signed)
Conscious Sedation °Sedation is the use of medicines to promote relaxation and relieve discomfort and anxiety. Conscious sedation is a type of sedation. Under conscious sedation you are less alert than normal but are still able to respond to instructions or stimulation. Conscious sedation is used during short medical and dental procedures. It is milder than deep sedation or general anesthesia and allows you to return to your regular activities sooner.  °LET YOUR HEALTH CARE PROVIDER KNOW ABOUT:  °· Any allergies you have. °· All medicines you are taking, including vitamins, herbs, eye drops, creams, and over-the-counter medicines. °· Use of steroids (by mouth or creams). °· Previous problems you or members of your family have had with the use of anesthetics. °· Any blood disorders you have. °· Previous surgeries you have had. °· Medical conditions you have. °· Possibility of pregnancy, if this applies. °· Use of cigarettes, alcohol, or illegal drugs. °RISKS AND COMPLICATIONS °Generally, this is a safe procedure. However, as with any procedure, problems can occur. Possible problems include: °· Oversedation. °· Trouble breathing on your own. You may need to have a breathing tube until you are awake and breathing on your own. °· Allergic reaction to any of the medicines used for the procedure. °BEFORE THE PROCEDURE °· You may have blood tests done. These tests can help show how well your kidneys and liver are working. They can also show how well your blood clots. °· A physical exam will be done.   °· Only take medicines as directed by your health care provider. You may need to stop taking medicines (such as blood thinners, aspirin, or nonsteroidal anti-inflammatory drugs) before the procedure.   °· Do not eat or drink at least 6 hours before the procedure or as directed by your health care provider. °· Arrange for a responsible adult, family member, or friend to take you home after the procedure. He or she should stay  with you for at least 24 hours after the procedure, until the medicine has worn off. °PROCEDURE  °· An intravenous (IV) catheter will be inserted into one of your veins. Medicine will be able to flow directly into your body through this catheter. You may be given medicine through this tube to help prevent pain and help you relax. °· The medical or dental procedure will be done. °AFTER THE PROCEDURE °· You will stay in a recovery area until the medicine has worn off. Your blood pressure and pulse will be checked.   °·  Depending on the procedure you had, you may be allowed to go home when you can tolerate liquids and your pain is under control. °Document Released: 02/25/2001 Document Revised: 06/07/2013 Document Reviewed: 02/07/2013 °ExitCare® Patient Information ©2015 [employer]. This information is not intended to replace advice given to you by your health care provider. Make sure you discuss any questions you have with your health care provider. °Bone Marrow Aspiration and Bone Biopsy °Examination of the bone marrow is a valuable test to diagnose blood disorders. A bone marrow biopsy takes a sample of bone and a small amount of fluid and cells from inside the bone. A bone marrow aspiration removes only the marrow. Bone marrow aspiration and bone biopsies are used to stage different disorders of the blood, such as leukemia. Staging will help your caregiver understand how far the disease has progressed.  °The tests are also useful in diagnosing: °· Fever of unknown origin (FUO). °· Bacterial infections and other widespread fungal infections. °· Cancers that have spread (metastasized) to the bone   marrow. °· Diseases that are characterized by a deficiency of an enzyme (storage diseases). This includes: °· Niemann-Pick disease. °· Gaucher disease. °PROCEDURE  °Sites used to get samples include:  °· Back of your hip bone (posterior iliac crest). °· Both aspiration and biopsy. °· Front of your hip bone (anterior iliac  crest). °· Both aspiration and biopsy. °· Breastbone (sternum). °· Aspiration from your breastbone (done only in adults). This method is rarely used. °When you get a hip bone aspiration: °· You are placed lying on your side with the upper knee brought up and flexed with the lower leg straight. °· The site is prepared, cleaned with an antiseptic scrub, and draped. This keeps the biopsy area clean. °· The skin and the area down to the lining of the bone (periosteum) are made numb with a local anesthetic. °· The bone marrow aspiration needle is inserted. You will feel pressure on your bone. °· Once inside the marrow cavity, a sample of bone marrow is sucked out (aspirated) for pathology slides. °· The material collected for bone marrow slides is processed immediately by a technologist. °· The technician selects the marrow particles to make the slides for pathology. °· The marrow aspiration needle is removed. Then pressure is applied to the site with gauze until bleeding has stopped. °Following an aspiration, a bone marrow biopsy may be performed as well. The technique for this is very similar. A dressing is then applied.  °RISKS AND COMPLICATIONS °· The main complications of a bone marrow aspiration and biopsy include infection and bleeding. °· Complications are uncommon. The procedure may not be performed in patients with bleeding tendencies. °· A very rare complication from the procedure is injury to the heart during a breastbone (sternal) marrow aspiration. Only bone marrow aspirations are performed in this area. °· Long-lasting pain at the site of the bone marrow aspiration and biopsy is uncommon. °Your caregiver will let you know when you are to get your results and will discuss them with you. You may make an appointment with your caregiver to find out the results. Do not assume everything is normal if you have not heard from your caregiver or the medical facility. It is important for you to follow up on all of  your test results. °Document Released: 06/05/2004 Document Revised: 08/25/2011 Document Reviewed: 05/30/2008 °ExitCare® Patient Information ©2015 [employer]. This information is not intended to replace advice given to you by your health care provider. Make sure you discuss any questions you have with your health care provider. ° °

## 2014-10-24 NOTE — Procedures (Signed)
CT-guided  R iliac bone marrow aspiration and core biopsy No complication No blood loss. See complete dictation in [facility] PACS  

## 2014-10-30 LAB — CHROMOSOME ANALYSIS, BONE MARROW

## 2014-11-06 ENCOUNTER — Ambulatory Visit (HOSPITAL_BASED_OUTPATIENT_CLINIC_OR_DEPARTMENT_OTHER): Payer: PPO | Admitting: Hematology

## 2014-11-06 ENCOUNTER — Other Ambulatory Visit (HOSPITAL_BASED_OUTPATIENT_CLINIC_OR_DEPARTMENT_OTHER): Payer: PPO

## 2014-11-06 ENCOUNTER — Telehealth: Payer: Self-pay | Admitting: Hematology

## 2014-11-06 ENCOUNTER — Encounter: Payer: Self-pay | Admitting: Hematology

## 2014-11-06 DIAGNOSIS — D473 Essential (hemorrhagic) thrombocythemia: Secondary | ICD-10-CM | POA: Diagnosis not present

## 2014-11-06 LAB — CBC WITH DIFFERENTIAL/PLATELET
BASO%: 0.5 % (ref 0.0–2.0)
Basophils Absolute: 0 10*3/uL (ref 0.0–0.1)
EOS%: 2.6 % (ref 0.0–7.0)
Eosinophils Absolute: 0.2 10*3/uL (ref 0.0–0.5)
HCT: 37.9 % (ref 34.8–46.6)
HGB: 12.6 g/dL (ref 11.6–15.9)
LYMPH%: 24.8 % (ref 14.0–49.7)
MCH: 30.8 pg (ref 25.1–34.0)
MCHC: 33.4 g/dL (ref 31.5–36.0)
MCV: 92.3 fL (ref 79.5–101.0)
MONO#: 0.7 10*3/uL (ref 0.1–0.9)
MONO%: 7.7 % (ref 0.0–14.0)
NEUT#: 5.8 10*3/uL (ref 1.5–6.5)
NEUT%: 64.4 % (ref 38.4–76.8)
Platelets: 560 10*3/uL — ABNORMAL HIGH (ref 145–400)
RBC: 4.1 10*6/uL (ref 3.70–5.45)
RDW: 15.3 % — ABNORMAL HIGH (ref 11.2–14.5)
WBC: 9 10*3/uL (ref 3.9–10.3)
lymph#: 2.2 10*3/uL (ref 0.9–3.3)

## 2014-11-06 MED ORDER — ANAGRELIDE HCL 0.5 MG PO CAPS: 0.5000 mg | ORAL_CAPSULE | Freq: Two times a day (BID) | ORAL | Status: DC

## 2014-11-06 NOTE — Progress Notes (Signed)
[place]  Telephone:(336) [phone] Fax:(336) [place] Note   Patient Care Team: Mayra Neer, MD as PCP - General (Family Medicine) 11/06/2014  CHIEF COMPLAINTS:  Follow-up essential thrombocythemia    Essential thrombocythemia   08/13/2014 - 08/14/2014 Hospital Admission Patient was admitted for acute onset headache and aphasia, spontaneously resolved quickly. Probable TIA versus migraine.   10/17/2014 Miscellaneous JAK2 V617F mutation (+),    10/24/2014 Initial Diagnosis Essential thrombocythemia. Pt presented with thrombocytosis with platelet [phone] since 2013.    10/24/2014 Bone Marrow Biopsy Hypercellular marrow with increased and atypical megakaryocytes. No significant fibrosis. Myeloid and erythroid elements are not overly increased. The overall findings are suggestive of a myeloproliferative neoplasm, especially essential thrombocythemia.    11/06/2014 -  Chemotherapy Anagrelide 0.5 mg twice daily     HISTORY OF PRESENTING ILLNESS:  Yvonne Rose 69 y.o. female with past medical history of hypertension and anxiety, recent episode of TIA versus complicated migraine, is here because of thrombocytosis.  She had a mechanical fall at home in mid Feb, 2016. She slipped on the front steps of her house in a raining day. She had headaches after worse. She did not seek immediate medical attention. She presents to [facility] with sudden onset aphasia and had right arm weakness, which last for about 10 mins and spontaneously resolved. Her headaches got worse during the episode also. She denies any vision change or other neurological symptoms. She was evaluated in the hospital included CT brain, MRI brain, echocardiogram, carotid Doppler and labs that a wall or unremarkable except for elevated plt count at 616K. she was evaluated by neurology and subsequently discharged home.   She has had elevated platelete since 2013,  with platelet count  487K. Her WBC and hemoglobin has been normal. Her repeated a CBC in July 2013, August 2016 and March 2016 showed gradually increased platelet count at [name], 543K and a 603K. She has been asymptomatic. She denies any other episodes of thrombosis. She feels well overall, denies any symptoms. She lives with her husband and remains to be physically active at home.  INTERIM HISTORY: Yvonne Rose returns for follow-up. She is doing well overall. She tolerated the bone marrow biopsy very well without complications. She denies any leg swollen, dyspnea, pain or other new symptoms.  MEDICAL HISTORY:  Past Medical History  Diagnosis Date  . Hypertension   . Anxiety   . PONV (postoperative nausea and vomiting)     SURGICAL HISTORY: Past Surgical History  Procedure Laterality Date  . Back surgery    . Dilation and curettage of uterus    . Abdominal hysterectomy      SOCIAL HISTORY: History   Social History  . Marital Status: Married    Spouse Name: N/A  . Number of Children: N/A  . Years of Education: N/A   Occupational History  . Not on file.   Social History Main Topics  . Smoking status: Never Smoker   . Smokeless tobacco: Not on file  . Alcohol Use: 1.2 - 1.8 oz/week    2-3 Cans of beer per week     Comment: daily   . Drug Use: No  . Sexual Activity: Yes    Birth Control/ Protection: Post-menopausal   Other Topics Concern  . Not on file   Social History Narrative    FAMILY HISTORY: Family History  Problem Relation Age of Onset  . Aneurysm Mother   . Hypertension Father   .  Alcoholism Father   . Lupus Sister     ALLERGIES:  is allergic to sulfa antibiotics.  MEDICATIONS:  Current Outpatient Prescriptions  Medication Sig Dispense Refill  . acetaminophen (TYLENOL) 500 MG tablet Take 500 mg by mouth every 8 (eight) hours as needed for mild pain.     Marland Kitchen amitriptyline (ELAVIL) 25 MG tablet Take 25 mg by mouth at bedtime.    Marland Kitchen aspirin 325 MG tablet Take 1 tablet (325 mg  total) by mouth daily. 30 tablet 2  . diazepam (VALIUM) 5 MG tablet Take 5 mg by mouth every 6 (six) hours as needed for anxiety.   0  . lisinopril (PRINIVIL,ZESTRIL) 20 MG tablet Take 20 mg by mouth every morning.   0  . milk thistle 175 MG tablet Take 175 mg by mouth daily.    . Multiple Vitamins-Minerals (MULTIVITAMIN WITH MINERALS) tablet Take 1 tablet by mouth daily.    . pravastatin (PRAVACHOL) 80 MG tablet Take 1 tablet (80 mg total) by mouth daily. 30 tablet 2  . anagrelide (AGRYLIN) 0.5 MG capsule Take 1 capsule (0.5 mg total) by mouth 2 (two) times daily. 60 capsule 1   No current facility-administered medications for this visit.    REVIEW OF SYSTEMS:   Constitutional: Denies fevers, chills or abnormal night sweats Eyes: Denies blurriness of vision, double vision or watery eyes Ears, nose, mouth, throat, and face: Denies mucositis or sore throat Respiratory: Denies cough, dyspnea or wheezes Cardiovascular: Denies palpitation, chest discomfort or lower extremity swelling Gastrointestinal:  Denies nausea, heartburn or change in bowel habits Skin: Denies abnormal skin rashes Lymphatics: Denies new lymphadenopathy or easy bruising Neurological:Denies numbness, tingling or new weaknesses Behavioral/Psych: Mood is stable, no new changes  All other systems were reviewed with the patient and are negative.  PHYSICAL EXAMINATION: ECOG PERFORMANCE STATUS: 0 - Asymptomatic  Filed Vitals:   11/06/14 1245  BP: 131/73  Pulse: 80  Temp: 97 F (36.1 C)  Resp: 18   Filed Weights   11/06/14 1245  Weight: 147 lb 4.8 oz (66.815 kg)    GENERAL:alert, no distress and comfortable SKIN: skin color, texture, turgor are normal, no rashes or significant lesions EYES: normal, conjunctiva are pink and non-injected, sclera clear OROPHARYNX:no exudate, no erythema and lips, buccal mucosa, and tongue normal  NECK: supple, thyroid normal size, non-tender, without nodularity LYMPH:  no palpable  lymphadenopathy in the cervical, axillary or inguinal LUNGS: clear to auscultation and percussion with normal breathing effort HEART: regular rate & rhythm and no murmurs and no lower extremity edema ABDOMEN:abdomen soft, non-tender and normal bowel sounds Musculoskeletal:no cyanosis of digits and no clubbing  PSYCH: alert & oriented x 3 with fluent speech NEURO: no focal motor/sensory deficits  LABORATORY DATA:  I have reviewed the data as listed CBC Latest Ref Rng 11/06/2014 10/24/2014 10/16/2014  WBC 3.9 - 10.3 10e3/uL 9.0 8.0 8.7  Hemoglobin 11.6 - 15.9 g/dL 12.6 13.1 13.3  Hematocrit 34.8 - 46.6 % 37.9 40.5 40.0  Platelets 145 - 400 10e3/uL 560(H) 573(H) 559(H)    CMP Latest Ref Rng 10/16/2014 08/13/2014 08/13/2014  Glucose 70 - 140 mg/dl 104 107(H) 104(H)  BUN 7.0 - 26.0 mg/dL 22._0 Creatinine 0.6 - 1.1 mg/dL 0.8 0.60 0.62  Sodium 136 - 145 mEq/L 141 138 137  Potassium 3.5 - 5.1 mEq/L 4.4 3.6 3.6  Chloride 96 - 112 mmol/L - 102 101  CO2 22 - 29 mEq/L 26 - 25  Calcium 8.4 - 10.4  mg/dL 10.2 - 9.9  Total Protein 6.4 - 8.3 g/dL 7.9 - 8.3  Total Bilirubin 0.20 - 1.20 mg/dL 0.34 - 0.8  Alkaline Phos 40 - 150 U/L 69 - 65  AST 5 - 34 U/L 26 - 29  ALT 0 - 55 U/L 21 - 21     PATHOLOGY REPORT  Diagnosis Bone Marrow, Aspirate,Biopsy, and Clot, right iliac - HYPERCELLULAR MARROW WITH INCREASED AND ATYPICAL MEGAKARYOCYTES. - SEE COMMENT. PERIPHERAL BLOOD: - THROMBOCYTOSIS. Diagnosis Note The marrow is hypercellular with increased megakaryocytes. Many of the megakaryocytes are atypical with larger forms and hyperlobated nuclei. There is no significant fibrosis. Myeloid and erythroid elements are not overtly increased. The patient is noted to have a persistent thrombocytosis. While reactive causes must be excluded, the overall findings are suggestive of a myeloproliferative neoplasm, specifically essential thrombocythemia. Correlation with molecular testing for JAK-2 and BCR/ABL is  recommended.    RADIOGRAPHIC STUDIES: I have personally reviewed the radiological images as listed and agreed with the findings in the report.   ASSESSMENT & PLAN:  69 year old Caucasian female, with past medical history of hypertension, anxiety, recent episode of TIA, was negative workup except elevated platelet count.  1. Essential thrombocythemia, JAK2 V617F (+) -I reviewed her outside lab results, her platelet count has been slowly trending up since 2013. The rest of her CBC are normal. -She does not appear to have obvious cause for reactive thrombocytosis, such as iron deficient anemia, chronic infection or chronic inflammation. -I reviewed her test results including check to mutation and bone marrow biopsy results. Those tests are consistent with essential thrombocythemia. -The natural history of ET were reviewed with patient, it is usually a benign and indolent disease, not curable, major complications is thrombosis, some patients will develop myelofibrosis at late stage. -Given her advanced age, recent history of TIA, I recommend her to start anagrelide to control her thrombocytosis. The benefit and potential side effects were discussed with patient, I'll start her on 0.5 mg twice daily. Dose adjustment may needed.  -Continue aspirin 325 mg daily -I'll check her CBC in 2 weeks, and return to clinic in 1 months.  2. Hypertension and anxiety -She will continue follow-up with her primary care physician   All questions were answered. The patient knows to call the clinic with any problems, questions or concerns. I spent 25 minutes counseling the patient face to face. The total time spent in the appointment was 35 minutes and more than 50% was on counseling.     Truitt Merle, MD 11/06/2014 10:25 PM

## 2014-11-06 NOTE — Telephone Encounter (Signed)
Pt confirmed labs/ov per 05/23 POF, gave pt AVS and Calendar.... KJ  °

## 2014-11-08 ENCOUNTER — Encounter: Payer: Self-pay | Admitting: *Deleted

## 2014-11-08 ENCOUNTER — Encounter: Payer: Self-pay | Admitting: Hematology

## 2014-11-20 ENCOUNTER — Other Ambulatory Visit (HOSPITAL_BASED_OUTPATIENT_CLINIC_OR_DEPARTMENT_OTHER): Payer: PPO

## 2014-11-20 DIAGNOSIS — D473 Essential (hemorrhagic) thrombocythemia: Secondary | ICD-10-CM | POA: Diagnosis not present

## 2014-11-20 LAB — CBC WITH DIFFERENTIAL/PLATELET
BASO%: 0.3 % (ref 0.0–2.0)
Basophils Absolute: 0 10*3/uL (ref 0.0–0.1)
EOS%: 2.7 % (ref 0.0–7.0)
Eosinophils Absolute: 0.2 10*3/uL (ref 0.0–0.5)
HCT: 35.8 % (ref 34.8–46.6)
HGB: 11.8 g/dL (ref 11.6–15.9)
LYMPH%: 29.5 % (ref 14.0–49.7)
MCH: 30.7 pg (ref 25.1–34.0)
MCHC: 32.8 g/dL (ref 31.5–36.0)
MCV: 93.6 fL (ref 79.5–101.0)
MONO#: 0.5 10*3/uL (ref 0.1–0.9)
MONO%: 7 % (ref 0.0–14.0)
NEUT#: 4.3 10*3/uL (ref 1.5–6.5)
NEUT%: 60.5 % (ref 38.4–76.8)
Platelets: 298 10*3/uL (ref 145–400)
RBC: 3.83 10*6/uL (ref 3.70–5.45)
RDW: 15.5 % — ABNORMAL HIGH (ref 11.2–14.5)
WBC: 7.1 10*3/uL (ref 3.9–10.3)
lymph#: 2.1 10*3/uL (ref 0.9–3.3)

## 2014-12-01 ENCOUNTER — Telehealth: Payer: Self-pay | Admitting: Hematology

## 2014-12-01 NOTE — Telephone Encounter (Signed)
cld pt to adv moved appt to 9:30 on 6/20 pt understood

## 2014-12-04 ENCOUNTER — Other Ambulatory Visit (HOSPITAL_BASED_OUTPATIENT_CLINIC_OR_DEPARTMENT_OTHER): Payer: PPO

## 2014-12-04 ENCOUNTER — Telehealth: Payer: Self-pay | Admitting: Hematology

## 2014-12-04 ENCOUNTER — Ambulatory Visit (HOSPITAL_BASED_OUTPATIENT_CLINIC_OR_DEPARTMENT_OTHER): Payer: PPO | Admitting: Hematology

## 2014-12-04 ENCOUNTER — Encounter: Payer: Self-pay | Admitting: Hematology

## 2014-12-04 ENCOUNTER — Other Ambulatory Visit: Payer: PPO

## 2014-12-04 ENCOUNTER — Ambulatory Visit: Payer: PPO | Admitting: Hematology

## 2014-12-04 VITALS — BP 156/66 | HR 72 | Temp 98.3°F | Resp 20 | Ht 65.0 in | Wt 148.4 lb

## 2014-12-04 DIAGNOSIS — D473 Essential (hemorrhagic) thrombocythemia: Secondary | ICD-10-CM

## 2014-12-04 LAB — CBC WITH DIFFERENTIAL/PLATELET
BASO%: 0.5 % (ref 0.0–2.0)
Basophils Absolute: 0 10*3/uL (ref 0.0–0.1)
EOS%: 2 % (ref 0.0–7.0)
Eosinophils Absolute: 0.2 10*3/uL (ref 0.0–0.5)
HCT: 36.7 % (ref 34.8–46.6)
HGB: 12.2 g/dL (ref 11.6–15.9)
LYMPH%: 26.5 % (ref 14.0–49.7)
MCH: 31 pg (ref 25.1–34.0)
MCHC: 33.3 g/dL (ref 31.5–36.0)
MCV: 93.2 fL (ref 79.5–101.0)
MONO#: 0.6 10*3/uL (ref 0.1–0.9)
MONO%: 7.7 % (ref 0.0–14.0)
NEUT#: 5.1 10*3/uL (ref 1.5–6.5)
NEUT%: 63.3 % (ref 38.4–76.8)
Platelets: 454 10*3/uL — ABNORMAL HIGH (ref 145–400)
RBC: 3.94 10*6/uL (ref 3.70–5.45)
RDW: 15.5 % — ABNORMAL HIGH (ref 11.2–14.5)
WBC: 8.1 10*3/uL (ref 3.9–10.3)
lymph#: 2.1 10*3/uL (ref 0.9–3.3)

## 2014-12-04 MED ORDER — ANAGRELIDE HCL 0.5 MG PO CAPS: 0.5000 mg | ORAL_CAPSULE | Freq: Two times a day (BID) | ORAL | Status: DC

## 2014-12-04 NOTE — Telephone Encounter (Signed)
Gave avs & calendar for July/August. °

## 2014-12-04 NOTE — Progress Notes (Signed)
[place]  Telephone:(336) [phone] Fax:(336) [place] Note   Patient Care Team: Mayra Neer, MD as PCP - General (Family Medicine) 12/04/2014  CHIEF COMPLAINTS:  Follow-up essential thrombocythemia    Essential thrombocythemia   08/13/2014 - 08/14/2014 Hospital Admission Patient was admitted for acute onset headache and aphasia, spontaneously resolved quickly. Probable TIA versus migraine.   10/17/2014 Miscellaneous JAK2 V617F mutation (+),    10/24/2014 Initial Diagnosis Essential thrombocythemia. Pt presented with thrombocytosis with platelet [phone] since 2013.    10/24/2014 Bone Marrow Biopsy Hypercellular marrow with increased and atypical megakaryocytes. No significant fibrosis. Myeloid and erythroid elements are not overly increased. The overall findings are suggestive of a myeloproliferative neoplasm, especially essential thrombocythemia.    11/06/2014 -  Chemotherapy Anagrelide 0.5 mg twice daily     HISTORY OF PRESENTING ILLNESS:  Yvonne Rose 69 y.o. female with past medical history of hypertension and anxiety, recent episode of TIA versus complicated migraine, is here because of thrombocytosis.  She had a mechanical fall at home in mid Feb, 2016. She slipped on the front steps of her house in a raining day. She had headaches after worse. She did not seek immediate medical attention. She presents to [facility] with sudden onset aphasia and had right arm weakness, which last for about 10 mins and spontaneously resolved. Her headaches got worse during the episode also. She denies any vision change or other neurological symptoms. She was evaluated in the hospital included CT brain, MRI brain, echocardiogram, carotid Doppler and labs that a wall or unremarkable except for elevated plt count at 616K. she was evaluated by neurology and subsequently discharged home.   She has had elevated platelete since 2013,  with platelet count  487K. Her WBC and hemoglobin has been normal. Her repeated a CBC in July 2013, August 2016 and March 2016 showed gradually increased platelet count at [place], 543K and a 603K. She has been asymptomatic. She denies any other episodes of thrombosis. She feels well overall, denies any symptoms. She lives with her husband and remains to be physically active at home.  INTERIM HISTORY: Yvonne Rose returns for follow-up. She is doing well overall. She is tolerating anagrelide well, except mild sleepiness during the day. She denies any other new symptoms, no leg swollen or dyspnea. Her son has [place] disease, and was sent to a nursing home last months, she is quite distressed about that.  MEDICAL HISTORY:  Past Medical History  Diagnosis Date  . Hypertension   . Anxiety   . PONV (postoperative nausea and vomiting)     SURGICAL HISTORY: Past Surgical History  Procedure Laterality Date  . Back surgery    . Dilation and curettage of uterus    . Abdominal hysterectomy      SOCIAL HISTORY: History   Social History  . Marital Status: Married    Spouse Name: N/A  . Number of Children: N/A  . Years of Education: N/A   Occupational History  . Not on file.   Social History Main Topics  . Smoking status: Never Smoker   . Smokeless tobacco: Not on file  . Alcohol Use: 1.2 - 1.8 oz/week    2-3 Cans of beer per week     Comment: daily   . Drug Use: No  . Sexual Activity: Yes    Birth Control/ Protection: Post-menopausal   Other Topics Concern  . Not on file   Social History Narrative    FAMILY  HISTORY: Family History  Problem Relation Age of Onset  . Aneurysm Mother   . Hypertension Father   . Alcoholism Father   . Lupus Sister     ALLERGIES:  is allergic to sulfa antibiotics.  MEDICATIONS:  Current Outpatient Prescriptions  Medication Sig Dispense Refill  . acetaminophen (TYLENOL) 500 MG tablet Take 500 mg by mouth every 8 (eight) hours as needed for mild pain.     .  amitriptyline (ELAVIL) 25 MG tablet Take 25 mg by mouth at bedtime.    . anagrelide (AGRYLIN) 0.5 MG capsule Take 1 capsule (0.5 mg total) by mouth 2 (two) times daily. 60 capsule 1  . aspirin 325 MG tablet Take 1 tablet (325 mg total) by mouth daily. 30 tablet 2  . diazepam (VALIUM) 5 MG tablet Take 5 mg by mouth every 6 (six) hours as needed for anxiety.   0  . lisinopril (PRINIVIL,ZESTRIL) 20 MG tablet Take 20 mg by mouth every morning.   0  . milk thistle 175 MG tablet Take 175 mg by mouth daily.    . Multiple Vitamins-Minerals (MULTIVITAMIN WITH MINERALS) tablet Take 1 tablet by mouth daily.    . omeprazole (PRILOSEC) 20 MG capsule Take 20 mg by mouth daily.    . pravastatin (PRAVACHOL) 80 MG tablet Take 1 tablet (80 mg total) by mouth daily. 30 tablet 2   No current facility-administered medications for this visit.    REVIEW OF SYSTEMS:   Constitutional: Denies fevers, chills or abnormal night sweats Eyes: Denies blurriness of vision, double vision or watery eyes Ears, nose, mouth, throat, and face: Denies mucositis or sore throat Respiratory: Denies cough, dyspnea or wheezes Cardiovascular: Denies palpitation, chest discomfort or lower extremity swelling Gastrointestinal:  Denies nausea, heartburn or change in bowel habits Skin: Denies abnormal skin rashes Lymphatics: Denies new lymphadenopathy or easy bruising Neurological:Denies numbness, tingling or new weaknesses Behavioral/Psych: Mood is stable, no new changes  All other systems were reviewed with the patient and are negative.  PHYSICAL EXAMINATION: ECOG PERFORMANCE STATUS: 0 - Asymptomatic  Filed Vitals:   12/04/14 1032  BP: 156/66  Pulse: 72  Temp: 98.3 F (36.8 C)  Resp: 20   Filed Weights   12/04/14 1032  Weight: 148 lb 6.4 oz (67.314 kg)    GENERAL:alert, no distress and comfortable SKIN: skin color, texture, turgor are normal, no rashes or significant lesions EYES: normal, conjunctiva are pink and  non-injected, sclera clear OROPHARYNX:no exudate, no erythema and lips, buccal mucosa, and tongue normal  NECK: supple, thyroid normal size, non-tender, without nodularity LYMPH:  no palpable lymphadenopathy in the cervical, axillary or inguinal LUNGS: clear to auscultation and percussion with normal breathing effort HEART: regular rate & rhythm and no murmurs and no lower extremity edema ABDOMEN:abdomen soft, non-tender and normal bowel sounds Musculoskeletal:no cyanosis of digits and no clubbing  PSYCH: alert & oriented x 3 with fluent speech NEURO: no focal motor/sensory deficits  LABORATORY DATA:  I have reviewed the data as listed CBC Latest Ref Rng 12/04/2014 11/20/2014 11/06/2014  WBC 3.9 - 10.3 10e3/uL 8.1 7.1 9.0  Hemoglobin 11.6 - 15.9 g/dL 12.2 11.8 12.6  Hematocrit 34.8 - 46.6 % 36.7 35.8 37.9  Platelets 145 - 400 10e3/uL 454(H) 298 560(H)    CMP Latest Ref Rng 10/16/2014 08/13/2014 08/13/2014  Glucose 70 - 140 mg/dl 104 107(H) 104(H)  BUN 7.0 - 26.0 mg/dL 22.3 13 13  Creatinine 0.6 - 1.1 mg/dL 0.8 0.60 0.62  Sodium 136 - 145   mEq/L 141 138 137  Potassium 3.5 - 5.1 mEq/L 4.4 3.6 3.6  Chloride 96 - 112 mmol/L - 102 101  CO2 22 - 29 mEq/L 26 - 25  Calcium 8.4 - 10.4 mg/dL 10.2 - 9.9  Total Protein 6.4 - 8.3 g/dL 7.9 - 8.3  Total Bilirubin 0.20 - 1.20 mg/dL 0.34 - 0.8  Alkaline Phos 40 - 150 U/L 69 - 65  AST 5 - 34 U/L 26 - 29  ALT 0 - 55 U/L 21 - 21     PATHOLOGY REPORT  Diagnosis Bone Marrow, Aspirate,Biopsy, and Clot, right iliac - HYPERCELLULAR MARROW WITH INCREASED AND ATYPICAL MEGAKARYOCYTES. - SEE COMMENT. PERIPHERAL BLOOD: - THROMBOCYTOSIS. Diagnosis Note The marrow is hypercellular with increased megakaryocytes. Many of the megakaryocytes are atypical with larger forms and hyperlobated nuclei. There is no significant fibrosis. Myeloid and erythroid elements are not overtly increased. The patient is noted to have a persistent thrombocytosis. While reactive  causes must be excluded, the overall findings are suggestive of a myeloproliferative neoplasm, specifically essential thrombocythemia. Correlation with molecular testing for JAK-2 and BCR/ABL is recommended.    RADIOGRAPHIC STUDIES: I have personally reviewed the radiological images as listed and agreed with the findings in the report.   ASSESSMENT & PLAN:  69-year-old Caucasian female, with past medical history of hypertension, anxiety, recent episode of TIA, was negative workup except elevated platelet count.  1. Essential thrombocythemia, JAK2 V617F (+) -I reviewed her outside lab results, her platelet count has been slowly trending up since 2013. The rest of her CBC are normal. -She does not appear to have obvious cause for reactive thrombocytosis, such as iron deficient anemia, chronic infection or chronic inflammation. -I reviewed her test results including check to mutation and bone marrow biopsy results. Those tests are consistent with essential thrombocythemia. -The natural history of ET were reviewed with patient, it is usually a benign and indolent disease, not curable, major complications is thrombosis, some patients will develop myelofibrosis at late stage. -Given her advanced age, recent history of TIA, I recommend her to start anagrelide to control her thrombocytosis. The benefit and potential side effects were discussed with patient, I'll start her on 0.5 mg twice daily.  -She is tolerating anagrelide well, platelet was normal 2 weeks ago, slightly elevated today. We'll continue the same dose for now. -Continue aspirin 325 mg daily -I'll check her CBC in one month, and return to clinic in 2 months.  2. Hypertension and anxiety -She will continue follow-up with her primary care physician   All questions were answered. The patient knows to call the clinic with any problems, questions or concerns. I spent 20 minutes counseling the patient face to face. The total time spent in  the appointment was 25 minutes and more than 50% was on counseling.     Feng, Yan, MD 12/04/2014 10:40 AM   

## 2015-01-01 ENCOUNTER — Other Ambulatory Visit (HOSPITAL_BASED_OUTPATIENT_CLINIC_OR_DEPARTMENT_OTHER): Payer: PPO

## 2015-01-01 DIAGNOSIS — D473 Essential (hemorrhagic) thrombocythemia: Secondary | ICD-10-CM | POA: Diagnosis not present

## 2015-01-01 LAB — CBC WITH DIFFERENTIAL/PLATELET
BASO%: 0.4 % (ref 0.0–2.0)
Basophils Absolute: 0 10*3/uL (ref 0.0–0.1)
EOS%: 2.9 % (ref 0.0–7.0)
Eosinophils Absolute: 0.2 10*3/uL (ref 0.0–0.5)
HCT: 37.6 % (ref 34.8–46.6)
HGB: 12.3 g/dL (ref 11.6–15.9)
LYMPH%: 27.5 % (ref 14.0–49.7)
MCH: 30.9 pg (ref 25.1–34.0)
MCHC: 32.8 g/dL (ref 31.5–36.0)
MCV: 94.1 fL (ref 79.5–101.0)
MONO#: 0.6 10*3/uL (ref 0.1–0.9)
MONO%: 7.2 % (ref 0.0–14.0)
NEUT#: 4.8 10*3/uL (ref 1.5–6.5)
NEUT%: 62 % (ref 38.4–76.8)
Platelets: 309 10*3/uL (ref 145–400)
RBC: 4 10*6/uL (ref 3.70–5.45)
RDW: 14.5 % (ref 11.2–14.5)
WBC: 7.8 10*3/uL (ref 3.9–10.3)
lymph#: 2.1 10*3/uL (ref 0.9–3.3)

## 2015-01-29 ENCOUNTER — Other Ambulatory Visit (HOSPITAL_BASED_OUTPATIENT_CLINIC_OR_DEPARTMENT_OTHER): Payer: PPO

## 2015-01-29 ENCOUNTER — Encounter: Payer: Self-pay | Admitting: Hematology

## 2015-01-29 ENCOUNTER — Ambulatory Visit (HOSPITAL_BASED_OUTPATIENT_CLINIC_OR_DEPARTMENT_OTHER): Payer: PPO | Admitting: Hematology

## 2015-01-29 ENCOUNTER — Telehealth: Payer: Self-pay | Admitting: Hematology

## 2015-01-29 VITALS — BP 136/83 | HR 97 | Temp 98.5°F | Resp 18 | Ht 66.0 in | Wt 152.9 lb

## 2015-01-29 DIAGNOSIS — D473 Essential (hemorrhagic) thrombocythemia: Secondary | ICD-10-CM | POA: Diagnosis not present

## 2015-01-29 LAB — CBC WITH DIFFERENTIAL/PLATELET
BASO%: 0.7 % (ref 0.0–2.0)
Basophils Absolute: 0.1 10*3/uL (ref 0.0–0.1)
EOS%: 3.1 % (ref 0.0–7.0)
Eosinophils Absolute: 0.3 10*3/uL (ref 0.0–0.5)
HCT: 39.4 % (ref 34.8–46.6)
HGB: 12.7 g/dL (ref 11.6–15.9)
LYMPH%: 29.3 % (ref 14.0–49.7)
MCH: 31 pg (ref 25.1–34.0)
MCHC: 32.2 g/dL (ref 31.5–36.0)
MCV: 96.1 fL (ref 79.5–101.0)
MONO#: 0.8 10*3/uL (ref 0.1–0.9)
MONO%: 8.4 % (ref 0.0–14.0)
NEUT#: 5.3 10*3/uL (ref 1.5–6.5)
NEUT%: 58.5 % (ref 38.4–76.8)
Platelets: 390 10*3/uL (ref 145–400)
RBC: 4.1 10*6/uL (ref 3.70–5.45)
RDW: 14.2 % (ref 11.2–14.5)
WBC: 9.1 10*3/uL (ref 3.9–10.3)
lymph#: 2.7 10*3/uL (ref 0.9–3.3)

## 2015-01-29 NOTE — Telephone Encounter (Signed)
per pof to sch pt appt-gave pt copy of avs °

## 2015-01-29 NOTE — Progress Notes (Signed)
[place]  Telephone:([phone] Fax:(336) [place] Note   Patient Care Team: Mayra Neer, MD as PCP - General (Family Medicine) 01/29/2015  CHIEF COMPLAINTS:  Follow-up essential thrombocythemia    Essential thrombocythemia   08/13/2014 - 08/14/2014 Hospital Admission Patient was admitted for acute onset headache and aphasia, spontaneously resolved quickly. Probable TIA versus migraine.   10/17/2014 Miscellaneous JAK2 V617F mutation (+),    10/24/2014 Initial Diagnosis Essential thrombocythemia. Pt presented with thrombocytosis with platelet [phone] since 2013.    10/24/2014 Bone Marrow Biopsy Hypercellular marrow with increased and atypical megakaryocytes. No significant fibrosis. Myeloid and erythroid elements are not overly increased. The overall findings are suggestive of a myeloproliferative neoplasm, especially essential thrombocythemia.    11/06/2014 -  Chemotherapy Anagrelide 0.5 mg twice daily     HISTORY OF PRESENTING ILLNESS:  Yvonne Rose 69 y.o. female with past medical history of hypertension and anxiety, recent episode of TIA versus complicated migraine, is here because of thrombocytosis.  She had a mechanical fall at home in mid Feb, 2016. She slipped on the front steps of her house in a raining day. She had headaches after worse. She did not seek immediate medical attention. She presents to [facility] with sudden onset aphasia and had right arm weakness, which last for about 10 mins and spontaneously resolved. Her headaches got worse during the episode also. She denies any vision change or other neurological symptoms. She was evaluated in the hospital included CT brain, MRI brain, echocardiogram, carotid Doppler and labs that a wall or unremarkable except for elevated plt count at 616K. she was evaluated by neurology and subsequently discharged home.   She has had elevated platelete since 2013,  with platelet count  487K. Her WBC and hemoglobin has been normal. Her repeated a CBC in July 2013, August 2016 and March 2016 showed gradually increased platelet count at [place], 543K and a 603K. She has been asymptomatic. She denies any other episodes of thrombosis. She feels well overall, denies any symptoms. She lives with her husband and remains to be physically active at home.  CURRENT THERAPY: Anagrelide 0.74m twice daily  INTERIM HISTORY: Yvonne Rose returns for follow-up. She has been very compliant with anagrelide, and the tolerates very well. She has mild fatigue, no other side effects. She has good appetite and energy level, denies significant pain, dyspnea, or other symptoms. She has not seen her primary care physician Dr. SBrigitte Pulsefor over a year.  MEDICAL HISTORY:  Past Medical History  Diagnosis Date  . Hypertension   . Anxiety   . PONV (postoperative nausea and vomiting)     SURGICAL HISTORY: Past Surgical History  Procedure Laterality Date  . Back surgery    . Dilation and curettage of uterus    . Abdominal hysterectomy      SOCIAL HISTORY: Social History   Social History  . Marital Status: Married    Spouse Name: N/A  . Number of Children: N/A  . Years of Education: N/A   Occupational History  . Not on file.   Social History Main Topics  . Smoking status: Never Smoker   . Smokeless tobacco: Not on file  . Alcohol Use: 1.2 - 1.8 oz/week    2-3 Cans of beer per week     Comment: daily   . Drug Use: No  . Sexual Activity: Yes    Birth Control/ Protection: Post-menopausal   Other Topics Concern  . Not on file  Social History Narrative    FAMILY HISTORY: Family History  Problem Relation Age of Onset  . Aneurysm Mother   . Hypertension Father   . Alcoholism Father   . Lupus Sister     ALLERGIES:  is allergic to sulfa antibiotics.  MEDICATIONS:  Current Outpatient Prescriptions  Medication Sig Dispense Refill  . acetaminophen (TYLENOL) 500 MG tablet Take 500 mg by  mouth every 8 (eight) hours as needed for mild pain.     Marland Kitchen amitriptyline (ELAVIL) 25 MG tablet Take 25 mg by mouth at bedtime.    Marland Kitchen anagrelide (AGRYLIN) 0.5 MG capsule Take 1 capsule (0.5 mg total) by mouth 2 (two) times daily. 60 capsule 5  . aspirin 325 MG tablet Take 1 tablet (325 mg total) by mouth daily. 30 tablet 2  . diazepam (VALIUM) 5 MG tablet Take 5 mg by mouth every 6 (six) hours as needed for anxiety.   0  . lisinopril (PRINIVIL,ZESTRIL) 20 MG tablet Take 20 mg by mouth every morning.   0  . Multiple Vitamins-Minerals (MULTIVITAMIN WITH MINERALS) tablet Take 1 tablet by mouth daily.    Marland Kitchen omeprazole (PRILOSEC) 20 MG capsule Take 20 mg by mouth daily.    . pravastatin (PRAVACHOL) 80 MG tablet Take 1 tablet (80 mg total) by mouth daily. 30 tablet 2   No current facility-administered medications for this visit.    REVIEW OF SYSTEMS:   Constitutional: Denies fevers, chills or abnormal night sweats Eyes: Denies blurriness of vision, double vision or watery eyes Ears, nose, mouth, throat, and face: Denies mucositis or sore throat Respiratory: Denies cough, dyspnea or wheezes Cardiovascular: Denies palpitation, chest discomfort or lower extremity swelling Gastrointestinal:  Denies nausea, heartburn or change in bowel habits Skin: Denies abnormal skin rashes Lymphatics: Denies new lymphadenopathy or easy bruising Neurological:Denies numbness, tingling or new weaknesses Behavioral/Psych: Mood is stable, no new changes  All other systems were reviewed with the patient and are negative.  PHYSICAL EXAMINATION: ECOG PERFORMANCE STATUS: 0 - Asymptomatic  Filed Vitals:   01/29/15 0831  BP: 136/83  Pulse: 97  Temp:   Resp:    Filed Weights   01/29/15 0824  Weight: 152 lb 14.4 oz (69.355 kg)    GENERAL:alert, no distress and comfortable SKIN: skin color, texture, turgor are normal, no rashes or significant lesions EYES: normal, conjunctiva are pink and non-injected, sclera  clear OROPHARYNX:no exudate, no erythema and lips, buccal mucosa, and tongue normal  NECK: supple, thyroid normal size, non-tender, without nodularity LYMPH:  no palpable lymphadenopathy in the cervical, axillary or inguinal LUNGS: clear to auscultation and percussion with normal breathing effort HEART: regular rate & rhythm and no murmurs and no lower extremity edema ABDOMEN:abdomen soft, non-tender and normal bowel sounds Musculoskeletal:no cyanosis of digits and no clubbing  PSYCH: alert & oriented x 3 with fluent speech NEURO: no focal motor/sensory deficits  LABORATORY DATA:  I have reviewed the data as listed CBC Latest Ref Rng 01/29/2015 01/01/2015 12/04/2014  WBC 3.9 - 10.3 10e3/uL 9.1 7.8 8.1  Hemoglobin 11.6 - 15.9 g/dL 12.7 12.3 12.2  Hematocrit 34.8 - 46.6 % 39.4 37.6 36.7  Platelets 145 - 400 10e3/uL 390 309 454(H)    CMP Latest Ref Rng 10/16/2014 08/13/2014 08/13/2014  Glucose 70 - 140 mg/dl 104 107(H) 104(H)  BUN 7.0 - 26.0 mg/dL 22.3 13 13   Creatinine 0.6 - 1.1 mg/dL 0.8 0.60 0.62  Sodium 136 - 145 mEq/L 141 138 137  Potassium 3.5 - 5.1 mEq/L 4.4  3.6 3.6  Chloride 96 - 112 mmol/L - 102 101  CO2 22 - 29 mEq/L 26 - 25  Calcium 8.4 - 10.4 mg/dL 10.2 - 9.9  Total Protein 6.4 - 8.3 g/dL 7.9 - 8.3  Total Bilirubin 0.20 - 1.20 mg/dL 0.34 - 0.8  Alkaline Phos 40 - 150 U/L 69 - 65  AST 5 - 34 U/L 26 - 29  ALT 0 - 55 U/L 21 - 21     PATHOLOGY REPORT  Diagnosis Bone Marrow, Aspirate,Biopsy, and Clot, right iliac - HYPERCELLULAR MARROW WITH INCREASED AND ATYPICAL MEGAKARYOCYTES. - SEE COMMENT. PERIPHERAL BLOOD: - THROMBOCYTOSIS. Diagnosis Note The marrow is hypercellular with increased megakaryocytes. Many of the megakaryocytes are atypical with larger forms and hyperlobated nuclei. There is no significant fibrosis. Myeloid and erythroid elements are not overtly increased. The patient is noted to have a persistent thrombocytosis. While reactive causes must be excluded,  the overall findings are suggestive of a myeloproliferative neoplasm, specifically essential thrombocythemia. Correlation with molecular testing for JAK-2 and BCR/ABL is recommended.    RADIOGRAPHIC STUDIES: I have personally reviewed the radiological images as listed and agreed with the findings in the report.   ASSESSMENT & PLAN:  69 year old Caucasian female, with past medical history of hypertension, anxiety, recent episode of TIA, was negative workup except elevated platelet count.  1. Essential thrombocythemia, JAK2 V617F (+) -I reviewed her outside lab results, her platelet count has been slowly trending up since 2013. The rest of her CBC are normal. -She does not appear to have obvious cause for reactive thrombocytosis, such as iron deficient anemia, chronic infection or chronic inflammation. -I reviewed her test results including JAK2 mutation and bone marrow biopsy results. Those tests are consistent with essential thrombocythemia. -The natural history of ET were reviewed with patient, it is usually a benign and indolent disease, not curable, major complications is thrombosis, some patients will develop myelofibrosis at late stage. -Given her advanced age, recent history of TIA, I recommend her to start anagrelide to control her thrombocytosis. The benefit and potential side effects were discussed with patient, I'll start her on 0.5 mg twice daily. Dose adjustment may needed.  -Continue aspirin 325 mg daily -She has had a good response to anagrelide, platelet count 398K today, we'll continue at the same dose.  2. Hypertension and anxiety -She will continue follow-up with her primary care physician  Follow-up: Return to clinic in 3 months with repeated CBC. I recommend her to follow-up with her primary care physician Dr. Brigitte Pulse for routine physical, and mammogram.  All questions were answered. The patient knows to call the clinic with any problems, questions or concerns. I spent 15  minutes counseling the patient face to face. The total time spent in the appointment was 20 minutes and more than 50% was on counseling.     Truitt Merle, MD 01/29/2015 8:33 AM

## 2015-03-06 ENCOUNTER — Encounter: Payer: Self-pay | Admitting: Hematology

## 2015-04-07 ENCOUNTER — Other Ambulatory Visit: Payer: Self-pay | Admitting: Family Medicine

## 2015-04-07 DIAGNOSIS — Z1231 Encounter for screening mammogram for malignant neoplasm of breast: Secondary | ICD-10-CM

## 2015-04-24 ENCOUNTER — Ambulatory Visit
Admission: RE | Admit: 2015-04-24 | Discharge: 2015-04-24 | Disposition: A | Discharge status: Home / Self Care | Payer: PPO | Source: Ambulatory Visit | Attending: Family Medicine | Admitting: Family Medicine

## 2015-04-24 DIAGNOSIS — Z1231 Encounter for screening mammogram for malignant neoplasm of breast: Secondary | ICD-10-CM

## 2015-05-07 ENCOUNTER — Telehealth: Payer: Self-pay | Admitting: Hematology

## 2015-05-07 ENCOUNTER — Encounter: Payer: Self-pay | Admitting: Hematology

## 2015-05-07 ENCOUNTER — Ambulatory Visit (HOSPITAL_BASED_OUTPATIENT_CLINIC_OR_DEPARTMENT_OTHER): Payer: PPO | Admitting: Hematology

## 2015-05-07 ENCOUNTER — Other Ambulatory Visit (HOSPITAL_BASED_OUTPATIENT_CLINIC_OR_DEPARTMENT_OTHER): Payer: PPO

## 2015-05-07 VITALS — BP 160/83 | HR 72 | Temp 97.8°F | Resp 18 | Ht 66.0 in | Wt 156.3 lb

## 2015-05-07 DIAGNOSIS — D473 Essential (hemorrhagic) thrombocythemia: Secondary | ICD-10-CM | POA: Diagnosis not present

## 2015-05-07 LAB — CBC WITH DIFFERENTIAL/PLATELET
BASO%: 0.7 % (ref 0.0–2.0)
Basophils Absolute: 0 10*3/uL (ref 0.0–0.1)
EOS%: 2.6 % (ref 0.0–7.0)
Eosinophils Absolute: 0.2 10*3/uL (ref 0.0–0.5)
HCT: 37 % (ref 34.8–46.6)
HGB: 12.2 g/dL (ref 11.6–15.9)
LYMPH%: 28.4 % (ref 14.0–49.7)
MCH: 31.2 pg (ref 25.1–34.0)
MCHC: 32.9 g/dL (ref 31.5–36.0)
MCV: 95 fL (ref 79.5–101.0)
MONO#: 0.6 10*3/uL (ref 0.1–0.9)
MONO%: 8 % (ref 0.0–14.0)
NEUT#: 4.2 10*3/uL (ref 1.5–6.5)
NEUT%: 60.3 % (ref 38.4–76.8)
Platelets: 291 10*3/uL (ref 145–400)
RBC: 3.9 10*6/uL (ref 3.70–5.45)
RDW: 14.2 % (ref 11.2–14.5)
WBC: 6.9 10*3/uL (ref 3.9–10.3)
lymph#: 2 10*3/uL (ref 0.9–3.3)

## 2015-05-07 MED ORDER — ANAGRELIDE HCL 0.5 MG PO CAPS: 0.5000 mg | ORAL_CAPSULE | Freq: Two times a day (BID) | ORAL | Status: DC

## 2015-05-07 NOTE — Progress Notes (Signed)
[place]  Telephone:([phone] Fax:(336) [phone]  Clinic follow Up Note   Patient Care Team: Mayra Neer, MD as PCP - General (Family Medicine) 05/07/2015  CHIEF COMPLAINTS:  Follow-up essential thrombocythemia    Essential thrombocythemia ([place])   08/13/2014 - 08/14/2014 Hospital Admission Patient was admitted for acute onset headache and aphasia, spontaneously resolved quickly. Probable TIA versus migraine.   10/17/2014 Miscellaneous JAK2 V617F mutation (+),    10/24/2014 Initial Diagnosis Essential thrombocythemia. Pt presented with thrombocytosis with platelet [phone] since 2013.    10/24/2014 Bone Marrow Biopsy Hypercellular marrow with increased and atypical megakaryocytes. No significant fibrosis. Myeloid and erythroid elements are not overly increased. The overall findings are suggestive of a myeloproliferative neoplasm, especially essential thrombocythemia.    11/06/2014 -  Chemotherapy Anagrelide 0.5 mg twice daily     HISTORY OF PRESENTING ILLNESS:  Yvonne Rose 69 y.o. female with past medical history of hypertension and anxiety, recent episode of TIA versus complicated migraine, is here because of thrombocytosis.  She had a mechanical fall at home in mid Feb, 2016. She slipped on the front steps of her house in a raining day. She had headaches after worse. She did not seek immediate medical attention. She presents to [facility] with sudden onset aphasia and had right arm weakness, which last for about 10 mins and spontaneously resolved. Her headaches got worse during the episode also. She denies any vision change or other neurological symptoms. She was evaluated in the hospital included CT brain, MRI brain, echocardiogram, carotid Doppler and labs that a wall or unremarkable except for elevated plt count at 616K. she was evaluated by neurology and subsequently discharged home.   She has had elevated platelete since 2013,  with platelet  count 487K. Her WBC and hemoglobin has been normal. Her repeated a CBC in July 2013, August 2016 and March 2016 showed gradually increased platelet count at [place], 543K and a 603K. She has been asymptomatic. She denies any other episodes of thrombosis. She feels well overall, denies any symptoms. She lives with her husband and remains to be physically active at home.  CURRENT THERAPY: Anagrelide 0.68m twice daily  INTERIM HISTORY: [name] returns for follow-up. She is doing very well clinically. Her fatigue has improved lately, she feels well overall. She denies any significant pain, leg swelling, dyspnea, or other symptoms. She is very compliant with anagrelide, tolerated well well without significant side effects.  MEDICAL HISTORY:  Past Medical History  Diagnosis Date  . Hypertension   . Anxiety   . PONV (postoperative nausea and vomiting)     SURGICAL HISTORY: Past Surgical History  Procedure Laterality Date  . Back surgery    . Dilation and curettage of uterus    . Abdominal hysterectomy      SOCIAL HISTORY: Social History   Social History  . Marital Status: Married    Spouse Name: N/A  . Number of Children: N/A  . Years of Education: N/A   Occupational History  . Not on file.   Social History Main Topics  . Smoking status: Never Smoker   . Smokeless tobacco: Not on file  . Alcohol Use: 1.2 - 1.8 oz/week    2-3 Cans of beer per week     Comment: daily   . Drug Use: No  . Sexual Activity: Yes    Birth Control/ Protection: Post-menopausal   Other Topics Concern  . Not on file   Social History Narrative  FAMILY HISTORY: Family History  Problem Relation Age of Onset  . Aneurysm Mother   . Hypertension Father   . Alcoholism Father   . Lupus Sister     ALLERGIES:  is allergic to sulfa antibiotics.  MEDICATIONS:  Current Outpatient Prescriptions  Medication Sig Dispense Refill  . acetaminophen (TYLENOL) 500 MG tablet Take 500 mg by mouth every 8  (eight) hours as needed for mild pain.     Marland Kitchen amitriptyline (ELAVIL) 25 MG tablet Take 25 mg by mouth at bedtime.    Marland Kitchen anagrelide (AGRYLIN) 0.5 MG capsule Take 1 capsule (0.5 mg total) by mouth 2 (two) times daily. 60 capsule 5  . aspirin 325 MG tablet Take 1 tablet (325 mg total) by mouth daily. 30 tablet 2  . diazepam (VALIUM) 5 MG tablet Take 5 mg by mouth every 6 (six) hours as needed for anxiety.   0  . lisinopril (PRINIVIL,ZESTRIL) 20 MG tablet Take 20 mg by mouth every morning.   0  . metoprolol succinate (TOPROL-XL) 50 MG 24 hr tablet Take 50 mg by mouth every evening.  0  . Multiple Vitamins-Minerals (MULTIVITAMIN WITH MINERALS) tablet Take 1 tablet by mouth daily.    Marland Kitchen omeprazole (PRILOSEC) 20 MG capsule Take 20 mg by mouth daily.    . pravastatin (PRAVACHOL) 80 MG tablet Take 1 tablet (80 mg total) by mouth daily. 30 tablet 2   No current facility-administered medications for this visit.    REVIEW OF SYSTEMS:   Constitutional: Denies fevers, chills or abnormal night sweats Eyes: Denies blurriness of vision, double vision or watery eyes Ears, nose, mouth, throat, and face: Denies mucositis or sore throat Respiratory: Denies cough, dyspnea or wheezes Cardiovascular: Denies palpitation, chest discomfort or lower extremity swelling Gastrointestinal:  Denies nausea, heartburn or change in bowel habits Skin: Denies abnormal skin rashes Lymphatics: Denies new lymphadenopathy or easy bruising Neurological:Denies numbness, tingling or new weaknesses Behavioral/Psych: Mood is stable, no new changes  All other systems were reviewed with the patient and are negative.  PHYSICAL EXAMINATION: ECOG PERFORMANCE STATUS: 0 - Asymptomatic  Filed Vitals:   05/07/15 0822  BP: 160/83  Pulse: 72  Temp: 97.8 F (36.6 C)  Resp: 18   Filed Weights   05/07/15 0822  Weight: 156 lb 4.8 oz (70.897 kg)    GENERAL:alert, no distress and comfortable SKIN: skin color, texture, turgor are normal,  no rashes or significant lesions EYES: normal, conjunctiva are pink and non-injected, sclera clear OROPHARYNX:no exudate, no erythema and lips, buccal mucosa, and tongue normal  NECK: supple, thyroid normal size, non-tender, without nodularity LYMPH:  no palpable lymphadenopathy in the cervical, axillary or inguinal LUNGS: clear to auscultation and percussion with normal breathing effort HEART: regular rate & rhythm and no murmurs and no lower extremity edema ABDOMEN:abdomen soft, non-tender and normal bowel sounds Musculoskeletal:no cyanosis of digits and no clubbing  PSYCH: alert & oriented x 3 with fluent speech NEURO: no focal motor/sensory deficits  LABORATORY DATA:  I have reviewed the data as listed CBC Latest Ref Rng 05/07/2015 01/29/2015 01/01/2015  WBC 3.9 - 10.3 10e3/uL 6.9 9.1 7.8  Hemoglobin 11.6 - 15.9 g/dL 12.2 12.7 12.3  Hematocrit 34.8 - 46.6 % 37.0 39.4 37.6  Platelets 145 - 400 10e3/uL 291 390 309    CMP Latest Ref Rng 10/16/2014 08/13/2014 08/13/2014  Glucose 70 - 140 mg/dl 104 107(H) 104(H)  BUN 7.0 - 26.0 mg/dL 22._0 Creatinine 0.6 - 1.1 mg/dL 0.8 0.60 0.62  Sodium 136 - 145 mEq/L 141 138 137  Potassium 3.5 - 5.1 mEq/L 4.4 3.6 3.6  Chloride 96 - 112 mmol/L - 102 101  CO2 22 - 29 mEq/L 26 - 25  Calcium 8.4 - 10.4 mg/dL 10.2 - 9.9  Total Protein 6.4 - 8.3 g/dL 7.9 - 8.3  Total Bilirubin 0.20 - 1.20 mg/dL 0.34 - 0.8  Alkaline Phos 40 - 150 U/L 69 - 65  AST 5 - 34 U/L 26 - 29  ALT 0 - 55 U/L 21 - 21     PATHOLOGY REPORT  Diagnosis Bone Marrow, Aspirate,Biopsy, and Clot, right iliac - HYPERCELLULAR MARROW WITH INCREASED AND ATYPICAL MEGAKARYOCYTES. - SEE COMMENT. PERIPHERAL BLOOD: - THROMBOCYTOSIS. Diagnosis Note The marrow is hypercellular with increased megakaryocytes. Many of the megakaryocytes are atypical with larger forms and hyperlobated nuclei. There is no significant fibrosis. Myeloid and erythroid elements are not overtly increased. The  patient is noted to have a persistent thrombocytosis. While reactive causes must be excluded, the overall findings are suggestive of a myeloproliferative neoplasm, specifically essential thrombocythemia. Correlation with molecular testing for JAK-2 and BCR/ABL is recommended.    RADIOGRAPHIC STUDIES: I have personally reviewed the radiological images as listed and agreed with the findings in the report.   ASSESSMENT & PLAN:  69 year old Caucasian female, with past medical history of hypertension, anxiety, recent episode of TIA, was negative workup except elevated platelet count.  1. Essential thrombocythemia, JAK2 V617F (+) -I reviewed her outside lab results, her platelet count has been slowly trending up since 2013. The rest of her CBC are normal. -She does not appear to have obvious cause for reactive thrombocytosis, such as iron deficient anemia, chronic infection or chronic inflammation. -I reviewed her test results including JAK2 mutation and bone marrow biopsy results. Those tests are consistent with essential thrombocythemia. -The natural history of ET were reviewed with patient, it is usually a benign and indolent disease, not curable, major complications is thrombosis, some patients will develop myelofibrosis at late stage. -Given her advanced age, recent history of TIA, I recommend her to start anagrelide to control her thrombocytosis. The benefit and potential side effects were discussed with patient, I'll start her on 0.5 mg twice daily. Dose adjustment may needed.  -Continue aspirin 325 mg daily -She has had a good response to anagrelide, platelet count 291K today, we'll continue at the same dose.  2. Hypertension and anxiety -She will continue follow-up with her primary care physician  Follow-up:  -CBC every 3 months -I'll see her back in 6 months -I refilled her anagrelide today  All questions were answered. The patient knows to call the clinic with any problems,  questions or concerns. I spent 15 minutes counseling the patient face to face. The total time spent in the appointment was 20 minutes and more than 50% was on counseling.     Truitt Merle, MD 05/07/2015 8:40 AM

## 2015-05-07 NOTE — Telephone Encounter (Signed)
Gave and printed appt sched and avs for pt for Feb and May 2017

## 2015-06-27 ENCOUNTER — Encounter: Payer: Self-pay | Admitting: Hematology

## 2015-06-27 NOTE — Telephone Encounter (Signed)
Called pt & she states that she has had a nose bleed the last three mornings but it stops easily.  Suggested that she try adding neosporin in both nostrils bid & trying a humidifier & OK to decrease to 81 mg on ASA until discussed with Dr Burr Medico.  She is not due labs again until Feb. Message to Dr Burr Medico also.  Discussed with Dr Burr Medico & she is in agreement with plan.

## 2015-07-09 ENCOUNTER — Encounter: Payer: Self-pay | Admitting: Hematology

## 2015-07-30 ENCOUNTER — Other Ambulatory Visit (HOSPITAL_BASED_OUTPATIENT_CLINIC_OR_DEPARTMENT_OTHER): Payer: PPO

## 2015-07-30 DIAGNOSIS — D473 Essential (hemorrhagic) thrombocythemia: Secondary | ICD-10-CM | POA: Diagnosis not present

## 2015-07-30 LAB — CBC WITH DIFFERENTIAL/PLATELET
BASO%: 0.4 % (ref 0.0–2.0)
Basophils Absolute: 0 10*3/uL (ref 0.0–0.1)
EOS%: 3 % (ref 0.0–7.0)
Eosinophils Absolute: 0.2 10*3/uL (ref 0.0–0.5)
HCT: 36.6 % (ref 34.8–46.6)
HGB: 12.1 g/dL (ref 11.6–15.9)
LYMPH%: 26.1 % (ref 14.0–49.7)
MCH: 30.8 pg (ref 25.1–34.0)
MCHC: 33.1 g/dL (ref 31.5–36.0)
MCV: 93.1 fL (ref 79.5–101.0)
MONO#: 0.6 10*3/uL (ref 0.1–0.9)
MONO%: 8.4 % (ref 0.0–14.0)
NEUT#: 4.7 10*3/uL (ref 1.5–6.5)
NEUT%: 62.1 % (ref 38.4–76.8)
Platelets: 225 10*3/uL (ref 145–400)
RBC: 3.93 10*6/uL (ref 3.70–5.45)
RDW: 14 % (ref 11.2–14.5)
WBC: 7.5 10*3/uL (ref 3.9–10.3)
lymph#: 2 10*3/uL (ref 0.9–3.3)

## 2015-10-22 ENCOUNTER — Encounter: Payer: Self-pay | Admitting: Hematology

## 2015-10-22 ENCOUNTER — Ambulatory Visit (HOSPITAL_BASED_OUTPATIENT_CLINIC_OR_DEPARTMENT_OTHER): Payer: PPO | Admitting: Hematology

## 2015-10-22 ENCOUNTER — Other Ambulatory Visit (HOSPITAL_BASED_OUTPATIENT_CLINIC_OR_DEPARTMENT_OTHER): Payer: PPO

## 2015-10-22 ENCOUNTER — Telehealth: Payer: Self-pay | Admitting: Hematology

## 2015-10-22 VITALS — BP 161/89 | HR 82 | Temp 97.8°F | Resp 18 | Ht 66.0 in | Wt 156.5 lb

## 2015-10-22 DIAGNOSIS — I10 Essential (primary) hypertension: Secondary | ICD-10-CM

## 2015-10-22 DIAGNOSIS — D473 Essential (hemorrhagic) thrombocythemia: Secondary | ICD-10-CM

## 2015-10-22 LAB — CBC WITH DIFFERENTIAL/PLATELET
BASO%: 0.8 % (ref 0.0–2.0)
Basophils Absolute: 0.1 10*3/uL (ref 0.0–0.1)
EOS%: 3.2 % (ref 0.0–7.0)
Eosinophils Absolute: 0.2 10*3/uL (ref 0.0–0.5)
HCT: 37.7 % (ref 34.8–46.6)
HGB: 12.4 g/dL (ref 11.6–15.9)
LYMPH%: 24 % (ref 14.0–49.7)
MCH: 30.6 pg (ref 25.1–34.0)
MCHC: 32.8 g/dL (ref 31.5–36.0)
MCV: 93.2 fL (ref 79.5–101.0)
MONO#: 0.6 10*3/uL (ref 0.1–0.9)
MONO%: 7.5 % (ref 0.0–14.0)
NEUT#: 4.9 10*3/uL (ref 1.5–6.5)
NEUT%: 64.5 % (ref 38.4–76.8)
Platelets: 338 10*3/uL (ref 145–400)
RBC: 4.05 10*6/uL (ref 3.70–5.45)
RDW: 14.4 % (ref 11.2–14.5)
WBC: 7.6 10*3/uL (ref 3.9–10.3)
lymph#: 1.8 10*3/uL (ref 0.9–3.3)

## 2015-10-22 LAB — COMPREHENSIVE METABOLIC PANEL
ALT: 24 U/L (ref 0–55)
AST: 28 U/L (ref 5–34)
Albumin: 4 g/dL (ref 3.5–5.0)
Alkaline Phosphatase: 73 U/L (ref 40–150)
Anion Gap: 7 mEq/L (ref 3–11)
BUN: 13.9 mg/dL (ref 7.0–26.0)
CO2: 26 mEq/L (ref 22–29)
Calcium: 10.3 mg/dL (ref 8.4–10.4)
Chloride: 106 mEq/L (ref 98–109)
Creatinine: 0.9 mg/dL (ref 0.6–1.1)
EGFR: 68 mL/min/{1.73_m2} — ABNORMAL LOW (ref >90)
Glucose: 97 mg/dl (ref 70–140)
Potassium: 4.9 mEq/L (ref 3.5–5.1)
Sodium: 140 mEq/L (ref 136–145)
Total Bilirubin: 0.31 mg/dL (ref 0.20–1.20)
Total Protein: 7.5 g/dL (ref 6.4–8.3)

## 2015-10-22 MED ORDER — ANAGRELIDE HCL 0.5 MG PO CAPS: 0.5000 mg | ORAL_CAPSULE | Freq: Two times a day (BID) | ORAL | Status: DC

## 2015-10-22 NOTE — Telephone Encounter (Signed)
per pof to sch pt appt-gave tp copy of avs °

## 2015-10-22 NOTE — Progress Notes (Signed)
is [facility]  Telephone:(336) [phone] Fax:(336) [phone]  Clinic follow Up Note   Patient Care Team: Mayra Neer, MD as PCP - General (Family Medicine) 10/22/2015  CHIEF COMPLAINTS:  Follow-up essential thrombocythemia    Essential thrombocythemia ([place])   08/13/2014 - 08/14/2014 Hospital Admission Patient was admitted for acute onset headache and aphasia, spontaneously resolved quickly. Probable TIA versus migraine.   10/17/2014 Miscellaneous JAK2 V617F mutation (+),    10/24/2014 Initial Diagnosis Essential thrombocythemia. Pt presented with thrombocytosis with platelet [phone] since 2013.    10/24/2014 Bone Marrow Biopsy Hypercellular marrow with increased and atypical megakaryocytes. No significant fibrosis. Myeloid and erythroid elements are not overly increased. The overall findings are suggestive of a myeloproliferative neoplasm, especially essential thrombocythemia.    11/06/2014 -  Chemotherapy Anagrelide 0.5 mg twice daily     HISTORY OF PRESENTING ILLNESS:  Yvonne Rose 70 y.o. female with past medical history of hypertension and anxiety, recent episode of TIA versus complicated migraine, is here because of thrombocytosis.  She had a mechanical fall at home in mid Feb, 2016. She slipped on the front steps of her house in a raining day. She had headaches after worse. She did not seek immediate medical attention. She presents to [facility] with sudden onset aphasia and had right arm weakness, which last for about 10 mins and spontaneously resolved. Her headaches got worse during the episode also. She denies any vision change or other neurological symptoms. She was evaluated in the hospital included CT brain, MRI brain, echocardiogram, carotid Doppler and labs that a wall or unremarkable except for elevated plt count at 616K. she was evaluated by neurology and subsequently discharged home.   She has had elevated platelete since 2013,  with platelet  count 487K. Her WBC and hemoglobin has been normal. Her repeated a CBC in July 2013, August 2016 and March 2016 showed gradually increased platelet count at [place], 543K and a 603K. She has been asymptomatic. She denies any other episodes of thrombosis. She feels well overall, denies any symptoms. She lives with her husband and remains to be physically active at home.  CURRENT THERAPY: Anagrelide 0.'5mg'$  twice daily  INTERIM HISTORY: Yvonne Rose returns for follow-up. She is doing very well clinically. She denies any pain, or other symptoms. She is compliant with anagrelide, tolerating well without any noticeable side effects. She would like to try to lose some weight, she is physically active, but has not been be particular attention to his diet yet. she is asking if she can take any supplement to lose weight.  MEDICAL HISTORY:  Past Medical History  Diagnosis Date  . Hypertension   . Anxiety   . PONV (postoperative nausea and vomiting)     SURGICAL HISTORY: Past Surgical History  Procedure Laterality Date  . Back surgery    . Dilation and curettage of uterus    . Abdominal hysterectomy      SOCIAL HISTORY: Social History   Social History  . Marital Status: Married    Spouse Name: N/A  . Number of Children: N/A  . Years of Education: N/A   Occupational History  . Not on file.   Social History Main Topics  . Smoking status: Never Smoker   . Smokeless tobacco: Not on file  . Alcohol Use: 1.2 - 1.8 oz/week    2-3 Cans of beer per week     Comment: daily   . Drug Use: No  . Sexual Activity: Yes  Birth Control/ Protection: Post-menopausal   Other Topics Concern  . Not on file   Social History Narrative    FAMILY HISTORY: Family History  Problem Relation Age of Onset  . Aneurysm Mother   . Hypertension Father   . Alcoholism Father   . Lupus Sister     ALLERGIES:  is allergic to sulfa antibiotics.  MEDICATIONS:  Current Outpatient Prescriptions  Medication Sig  Dispense Refill  . acetaminophen (TYLENOL) 500 MG tablet Take 500 mg by mouth every 8 (eight) hours as needed for mild pain.     Marland Kitchen amitriptyline (ELAVIL) 25 MG tablet Take 25 mg by mouth at bedtime.    Marland Kitchen anagrelide (AGRYLIN) 0.5 MG capsule Take 1 capsule (0.5 mg total) by mouth 2 (two) times daily. 180 capsule 1  . aspirin 325 MG tablet Take 1 tablet (325 mg total) by mouth daily. 30 tablet 2  . diazepam (VALIUM) 5 MG tablet Take 5 mg by mouth every 6 (six) hours as needed for anxiety.   0  . lisinopril (PRINIVIL,ZESTRIL) 20 MG tablet Take 20 mg by mouth every morning.   0  . metoprolol succinate (TOPROL-XL) 50 MG 24 hr tablet Take 50 mg by mouth every evening.  0  . Multiple Vitamins-Minerals (MULTIVITAMIN WITH MINERALS) tablet Take 1 tablet by mouth daily.    Marland Kitchen omeprazole (PRILOSEC) 20 MG capsule Take 20 mg by mouth daily.    . pravastatin (PRAVACHOL) 80 MG tablet Take 1 tablet (80 mg total) by mouth daily. 30 tablet 2   No current facility-administered medications for this visit.    REVIEW OF SYSTEMS:   Constitutional: Denies fevers, chills or abnormal night sweats Eyes: Denies blurriness of vision, double vision or watery eyes Ears, nose, mouth, throat, and face: Denies mucositis or sore throat Respiratory: Denies cough, dyspnea or wheezes Cardiovascular: Denies palpitation, chest discomfort or lower extremity swelling Gastrointestinal:  Denies nausea, heartburn or change in bowel habits Skin: Denies abnormal skin rashes Lymphatics: Denies new lymphadenopathy or easy bruising Neurological:Denies numbness, tingling or new weaknesses Behavioral/Psych: Mood is stable, no new changes  All other systems were reviewed with the patient and are negative.  PHYSICAL EXAMINATION: ECOG PERFORMANCE STATUS: 0 - Asymptomatic  Filed Vitals:   10/22/15 0849  BP: 161/89  Pulse: 82  Temp: 97.8 F (36.6 C)  Resp: 18   Filed Weights   10/22/15 0849  Weight: 156 lb 8 oz (70.988 kg)     GENERAL:alert, no distress and comfortable SKIN: skin color, texture, turgor are normal, no rashes or significant lesions EYES: normal, conjunctiva are pink and non-injected, sclera clear OROPHARYNX:no exudate, no erythema and lips, buccal mucosa, and tongue normal  NECK: supple, thyroid normal size, non-tender, without nodularity LYMPH:  no palpable lymphadenopathy in the cervical, axillary or inguinal LUNGS: clear to auscultation and percussion with normal breathing effort HEART: regular rate & rhythm and no murmurs and no lower extremity edema ABDOMEN:abdomen soft, non-tender and normal bowel sounds Musculoskeletal:no cyanosis of digits and no clubbing  PSYCH: alert & oriented x 3 with fluent speech NEURO: no focal motor/sensory deficits  LABORATORY DATA:  I have reviewed the data as listed CBC Latest Ref Rng 10/22/2015 07/30/2015 05/07/2015  WBC 3.9 - 10.3 10e3/uL 7.6 7.5 6.9  Hemoglobin 11.6 - 15.9 g/dL 12.4 12.1 12.2  Hematocrit 34.8 - 46.6 % 37.7 36.6 37.0  Platelets 145 - 400 10e3/uL 338 225 291    CMP Latest Ref Rng 10/22/2015 10/16/2014 08/13/2014  Glucose 70 - 140  mg/dl 97 104 107(H)  BUN 7.0 - 26.0 mg/dL 13.9 22.3 13  Creatinine 0.6 - 1.1 mg/dL 0.9 0.8 0.60  Sodium 136 - 145 mEq/L 140 141 138  Potassium 3.5 - 5.1 mEq/L 4.9 4.4 3.6  Chloride 96 - 112 mmol/L - - 102  CO2 22 - 29 mEq/L 26 26 -  Calcium 8.4 - 10.4 mg/dL 10.3 10.2 -  Total Protein 6.4 - 8.3 g/dL 7.5 7.9 -  Total Bilirubin 0.20 - 1.20 mg/dL 0.31 0.34 -  Alkaline Phos 40 - 150 U/L 73 69 -  AST 5 - 34 U/L 28 26 -  ALT 0 - 55 U/L 24 21 -     PATHOLOGY REPORT  Diagnosis Bone Marrow, Aspirate,Biopsy, and Clot, right iliac - HYPERCELLULAR MARROW WITH INCREASED AND ATYPICAL MEGAKARYOCYTES. - SEE COMMENT. PERIPHERAL BLOOD: - THROMBOCYTOSIS. Diagnosis Note The marrow is hypercellular with increased megakaryocytes. Many of the megakaryocytes are atypical with larger forms and hyperlobated nuclei. There is no  significant fibrosis. Myeloid and erythroid elements are not overtly increased. The patient is noted to have a persistent thrombocytosis. While reactive causes must be excluded, the overall findings are suggestive of a myeloproliferative neoplasm, specifically essential thrombocythemia. Correlation with molecular testing for JAK-2 and BCR/ABL is recommended.    RADIOGRAPHIC STUDIES: I have personally reviewed the radiological images as listed and agreed with the findings in the report.   ASSESSMENT & PLAN:  70 year old Caucasian female, with past medical history of hypertension, anxiety, recent episode of TIA, was negative workup except elevated platelet count.  1. Essential thrombocythemia, JAK2 V617F (+) -I reviewed her outside lab results, her platelet count has been slowly trending up since 2013. The rest of her CBC are normal. -She does not appear to have obvious cause for reactive thrombocytosis, such as iron deficient anemia, chronic infection or chronic inflammation. -I reviewed her test results including JAK2 mutation and bone marrow biopsy results. Those tests are consistent with essential thrombocythemia. -The natural history of ET were reviewed with patient, it is usually a benign and indolent disease, not curable, major complications is thrombosis, some patients will develop myelofibrosis at late stage. -Given her advanced age, recent history of TIA, I recommend her to start anagrelide to control her thrombocytosis.  -Continue aspirin 325 mg daily -She has had a good response to anagrelide, platelet count 338K today, we'll continue at the same dose.  2. Hypertension and anxiety -She will continue follow-up with her primary care physician  Follow-up:  -CBC every 3 months -I'll see her back in 6 months -I refilled her anagrelide today  All questions were answered. The patient knows to call the clinic with any problems, questions or concerns. I spent 15 minutes counseling  the patient face to face. The total time spent in the appointment was 20 minutes and more than 50% was on counseling.     Truitt Merle, MD 10/22/2015 10:08 AM

## 2015-10-25 DIAGNOSIS — H2513 Age-related nuclear cataract, bilateral: Secondary | ICD-10-CM | POA: Diagnosis not present

## 2015-10-25 DIAGNOSIS — H5212 Myopia, left eye: Secondary | ICD-10-CM | POA: Diagnosis not present

## 2015-11-01 DIAGNOSIS — H40003 Preglaucoma, unspecified, bilateral: Secondary | ICD-10-CM | POA: Diagnosis not present

## 2015-11-01 DIAGNOSIS — H25813 Combined forms of age-related cataract, bilateral: Secondary | ICD-10-CM | POA: Diagnosis not present

## 2015-11-07 DIAGNOSIS — H25813 Combined forms of age-related cataract, bilateral: Secondary | ICD-10-CM | POA: Diagnosis not present

## 2015-11-07 DIAGNOSIS — H401132 Primary open-angle glaucoma, bilateral, moderate stage: Secondary | ICD-10-CM | POA: Diagnosis not present

## 2015-11-26 DIAGNOSIS — H401122 Primary open-angle glaucoma, left eye, moderate stage: Secondary | ICD-10-CM | POA: Diagnosis not present

## 2015-11-26 DIAGNOSIS — H2512 Age-related nuclear cataract, left eye: Secondary | ICD-10-CM | POA: Diagnosis not present

## 2015-11-26 DIAGNOSIS — H25812 Combined forms of age-related cataract, left eye: Secondary | ICD-10-CM | POA: Diagnosis not present

## 2015-12-17 DIAGNOSIS — H2511 Age-related nuclear cataract, right eye: Secondary | ICD-10-CM | POA: Diagnosis not present

## 2015-12-17 DIAGNOSIS — H401112 Primary open-angle glaucoma, right eye, moderate stage: Secondary | ICD-10-CM | POA: Diagnosis not present

## 2015-12-17 DIAGNOSIS — H25011 Cortical age-related cataract, right eye: Secondary | ICD-10-CM | POA: Diagnosis not present

## 2015-12-17 DIAGNOSIS — H401132 Primary open-angle glaucoma, bilateral, moderate stage: Secondary | ICD-10-CM | POA: Diagnosis not present

## 2016-01-18 ENCOUNTER — Other Ambulatory Visit: Payer: Self-pay | Admitting: *Deleted

## 2016-01-18 DIAGNOSIS — D473 Essential (hemorrhagic) thrombocythemia: Secondary | ICD-10-CM

## 2016-01-21 ENCOUNTER — Other Ambulatory Visit: Payer: Self-pay | Admitting: Hematology

## 2016-01-21 ENCOUNTER — Other Ambulatory Visit (HOSPITAL_BASED_OUTPATIENT_CLINIC_OR_DEPARTMENT_OTHER): Payer: PPO

## 2016-01-21 DIAGNOSIS — D473 Essential (hemorrhagic) thrombocythemia: Secondary | ICD-10-CM

## 2016-01-21 LAB — CBC WITH DIFFERENTIAL/PLATELET
BASO%: 0.6 % (ref 0.0–2.0)
Basophils Absolute: 0 10*3/uL (ref 0.0–0.1)
EOS%: 2.3 % (ref 0.0–7.0)
Eosinophils Absolute: 0.2 10*3/uL (ref 0.0–0.5)
HCT: 36.9 % (ref 34.8–46.6)
HGB: 12.3 g/dL (ref 11.6–15.9)
LYMPH%: 27 % (ref 14.0–49.7)
MCH: 31.2 pg (ref 25.1–34.0)
MCHC: 33.3 g/dL (ref 31.5–36.0)
MCV: 93.5 fL (ref 79.5–101.0)
MONO#: 0.6 10*3/uL (ref 0.1–0.9)
MONO%: 8.1 % (ref 0.0–14.0)
NEUT#: 4.5 10*3/uL (ref 1.5–6.5)
NEUT%: 62 % (ref 38.4–76.8)
Platelets: 285 10*3/uL (ref 145–400)
RBC: 3.95 10*6/uL (ref 3.70–5.45)
RDW: 14.6 % — ABNORMAL HIGH (ref 11.2–14.5)
WBC: 7.3 10*3/uL (ref 3.9–10.3)
lymph#: 2 10*3/uL (ref 0.9–3.3)

## 2016-03-18 DIAGNOSIS — H401132 Primary open-angle glaucoma, bilateral, moderate stage: Secondary | ICD-10-CM | POA: Diagnosis not present

## 2016-04-20 NOTE — Progress Notes (Signed)
is [facility]  Telephone:(336) [phone] Fax:(336) [phone]  Clinic follow Up Note   Patient Care Team: Mayra Neer, MD as PCP - General (Family Medicine) 04/21/2016  CHIEF COMPLAINTS:  Follow-up essential thrombocythemia    Essential thrombocythemia ([place])   08/13/2014 - 08/14/2014 Hospital Admission    Patient was admitted for acute onset headache and aphasia, spontaneously resolved quickly. Probable TIA versus migraine.      10/17/2014 Miscellaneous    JAK2 V617F mutation (+),       10/24/2014 Initial Diagnosis    Essential thrombocythemia. Pt presented with thrombocytosis with platelet [phone] since 2013.       10/24/2014 Bone Marrow Biopsy    Hypercellular marrow with increased and atypical megakaryocytes. No significant fibrosis. Myeloid and erythroid elements are not overly increased. The overall findings are suggestive of a myeloproliferative neoplasm, especially essential thrombocythemia.       11/06/2014 -  Chemotherapy    Anagrelide 0.5 mg twice daily        HISTORY OF PRESENTING ILLNESS:  Yvonne Rose 70 y.o. female with past medical history of hypertension and anxiety, recent episode of TIA versus complicated migraine, is here because of thrombocytosis.  She had a mechanical fall at home in mid Feb, 2016. She slipped on the front steps of her house in a raining day. She had headaches after worse. She did not seek immediate medical attention. She presents to [facility] with sudden onset aphasia and had right arm weakness, which last for about 10 mins and spontaneously resolved. Her headaches got worse during the episode also. She denies any vision change or other neurological symptoms. She was evaluated in the hospital included CT brain, MRI brain, echocardiogram, carotid Doppler and labs that a wall or unremarkable except for elevated plt count at 616K. she was evaluated by neurology and subsequently discharged home.   She has had  elevated platelete since 2013,  with platelet count 487K. Her WBC and hemoglobin has been normal. Her repeated a CBC in July 2013, August 2016 and March 2016 showed gradually increased platelet count at [place], 543K and a 603K. She has been asymptomatic. She denies any other episodes of thrombosis. She feels well overall, denies any symptoms. She lives with her husband and remains to be physically active at home.  CURRENT THERAPY: Anagrelide 0.29m twice daily  INTERIM HISTORY: Ms. Kwasnik returns for follow-up. She is doing very well clinically. She is compliant with anagrelide, and tolerated well. No noticeable side effects. She denies any pain, leg swollen, dyspnea, or other new symptoms.  MEDICAL HISTORY:  Past Medical History:  Diagnosis Date  . Anxiety   . Hypertension   . PONV (postoperative nausea and vomiting)     SURGICAL HISTORY: Past Surgical History:  Procedure Laterality Date  . ABDOMINAL HYSTERECTOMY    . BACK SURGERY    . DILATION AND CURETTAGE OF UTERUS      SOCIAL HISTORY: Social History   Social History  . Marital status: Married    Spouse name: N/A  . Number of children: N/A  . Years of education: N/A   Occupational History  . Not on file.   Social History Main Topics  . Smoking status: Never Smoker  . Smokeless tobacco: Never Used  . Alcohol use 1.2 - 1.8 oz/week    2 - 3 Cans of beer per week     Comment: daily   . Drug use: No  . Sexual activity: Yes    Birth  control/ protection: Post-menopausal   Other Topics Concern  . Not on file   Social History Narrative  . No narrative on file    FAMILY HISTORY: Family History  Problem Relation Age of Onset  . Aneurysm Mother   . Hypertension Father   . Alcoholism Father   . Lupus Sister     ALLERGIES:  is allergic to sulfa antibiotics.  MEDICATIONS:  Current Outpatient Prescriptions  Medication Sig Dispense Refill  . acetaminophen (TYLENOL) 500 MG tablet Take 500 mg by mouth every 8 (eight)  hours as needed for mild pain.     Marland Kitchen amitriptyline (ELAVIL) 25 MG tablet Take 25 mg by mouth at bedtime.    Marland Kitchen anagrelide (AGRYLIN) 0.5 MG capsule Take 1 capsule (0.5 mg total) by mouth 2 (two) times daily. 180 capsule 1  . aspirin 325 MG tablet Take 1 tablet (325 mg total) by mouth daily. 30 tablet 2  . lisinopril (PRINIVIL,ZESTRIL) 20 MG tablet Take 20 mg by mouth every morning.   0  . metoprolol succinate (TOPROL-XL) 50 MG 24 hr tablet Take 50 mg by mouth every evening.  0  . Multiple Vitamins-Minerals (MULTIVITAMIN WITH MINERALS) tablet Take 1 tablet by mouth daily.    Marland Kitchen omeprazole (PRILOSEC) 20 MG capsule Take 20 mg by mouth daily.    . pravastatin (PRAVACHOL) 80 MG tablet Take 1 tablet (80 mg total) by mouth daily. 30 tablet 2   No current facility-administered medications for this visit.     REVIEW OF SYSTEMS:   Constitutional: Denies fevers, chills or abnormal night sweats Eyes: Denies blurriness of vision, double vision or watery eyes Ears, nose, mouth, throat, and face: Denies mucositis or sore throat Respiratory: Denies cough, dyspnea or wheezes Cardiovascular: Denies palpitation, chest discomfort or lower extremity swelling Gastrointestinal:  Denies nausea, heartburn or change in bowel habits Skin: Denies abnormal skin rashes Lymphatics: Denies new lymphadenopathy or easy bruising Neurological:Denies numbness, tingling or new weaknesses Behavioral/Psych: Mood is stable, no new changes  All other systems were reviewed with the patient and are negative.  PHYSICAL EXAMINATION: ECOG PERFORMANCE STATUS: 0 - Asymptomatic  Vitals:   04/21/16 0826  BP: (!) 159/88  Pulse: 75  Resp: 17  Temp: 98 F (36.7 C)   Filed Weights   04/21/16 0826  Weight: 152 lb 9.6 oz (69.2 kg)    GENERAL:alert, no distress and comfortable SKIN: skin color, texture, turgor are normal, no rashes or significant lesions EYES: normal, conjunctiva are pink and non-injected, sclera  clear OROPHARYNX:no exudate, no erythema and lips, buccal mucosa, and tongue normal  NECK: supple, thyroid normal size, non-tender, without nodularity LYMPH:  no palpable lymphadenopathy in the cervical, axillary or inguinal LUNGS: clear to auscultation and percussion with normal breathing effort HEART: regular rate & rhythm and no murmurs and no lower extremity edema ABDOMEN:abdomen soft, non-tender and normal bowel sounds Musculoskeletal:no cyanosis of digits and no clubbing  PSYCH: alert & oriented x 3 with fluent speech NEURO: no focal motor/sensory deficits  LABORATORY DATA:  I have reviewed the data as listed CBC Latest Ref Rng & Units 04/21/2016 01/21/2016 10/22/2015  WBC 3.9 - 10.3 10e3/uL 7.2 7.3 7.6  Hemoglobin 11.6 - 15.9 g/dL 12.2 12.3 12.4  Hematocrit 34.8 - 46.6 % 37.3 36.9 37.7  Platelets 145 - 400 10e3/uL 278 285 338    CMP Latest Ref Rng & Units 04/21/2016 10/22/2015 10/16/2014  Glucose 70 - 140 mg/dl 103 97 104  BUN 7.0 - 26.0 mg/dL 14.0 13.9 22.3  Creatinine 0.6 - 1.1 mg/dL 0.9 0.9 0.8  Sodium 136 - 145 mEq/L 140 140 141  Potassium 3.5 - 5.1 mEq/L 4.8 4.9 4.4  Chloride 96 - 112 mmol/L - - -  CO2 22 - 29 mEq/L 24 26 26   Calcium 8.4 - 10.4 mg/dL 9.9 10.3 10.2  Total Protein 6.4 - 8.3 g/dL 7.4 7.5 7.9  Total Bilirubin 0.20 - 1.20 mg/dL 0.36 0.31 0.34  Alkaline Phos 40 - 150 U/L 67 73 69  AST 5 - 34 U/L 24 28 26   ALT 0 - 55 U/L 19 24 21      PATHOLOGY REPORT  Diagnosis Bone Marrow, Aspirate,Biopsy, and Clot, right iliac - HYPERCELLULAR MARROW WITH INCREASED AND ATYPICAL MEGAKARYOCYTES. - SEE COMMENT. PERIPHERAL BLOOD: - THROMBOCYTOSIS. Diagnosis Note The marrow is hypercellular with increased megakaryocytes. Many of the megakaryocytes are atypical with larger forms and hyperlobated nuclei. There is no significant fibrosis. Myeloid and erythroid elements are not overtly increased. The patient is noted to have a persistent thrombocytosis. While reactive causes must be  excluded, the overall findings are suggestive of a myeloproliferative neoplasm, specifically essential thrombocythemia. Correlation with molecular testing for JAK-2 and BCR/ABL is recommended.    RADIOGRAPHIC STUDIES: I have personally reviewed the radiological images as listed and agreed with the findings in the report.   ASSESSMENT & PLAN:  70 year old Caucasian female, with past medical history of hypertension, anxiety, recent episode of TIA, was negative workup except elevated platelet count.  1. Essential thrombocythemia, JAK2 V617F (+) -I reviewed her outside lab results, her platelet count has been slowly trending up since 2013. The rest of her CBC are normal. -She does not appear to have obvious cause for reactive thrombocytosis, such as iron deficient anemia, chronic infection or chronic inflammation. -I reviewed her test results including JAK2 mutation and bone marrow biopsy results. Those tests are consistent with essential thrombocythemia. -The natural history of ET were reviewed with patient, it is usually a benign and indolent disease, not curable, major complications is thrombosis, some patients will develop myelofibrosis at late stage. -She has been on anagrelide, and her platelet count has been normal. She is tolerating treatment very well. We'll continue -Continue aspirin 325 mg daily   2. Hypertension and anxiety -She will continue follow-up with her primary care physician  Follow-up:  -CBC every 3 months -I'll see her back in 6 months -I refilled her anagrelide today  All questions were answered. The patient knows to call the clinic with any problems, questions or concerns. I spent 15 minutes counseling the patient face to face. The total time spent in the appointment was 20 minutes and more than 50% was on counseling.     Truitt Merle, MD 04/21/2016 9:11 AM

## 2016-04-21 ENCOUNTER — Other Ambulatory Visit (HOSPITAL_BASED_OUTPATIENT_CLINIC_OR_DEPARTMENT_OTHER): Payer: PPO

## 2016-04-21 ENCOUNTER — Telehealth: Payer: Self-pay | Admitting: Hematology

## 2016-04-21 ENCOUNTER — Encounter: Payer: Self-pay | Admitting: Hematology

## 2016-04-21 ENCOUNTER — Ambulatory Visit (HOSPITAL_BASED_OUTPATIENT_CLINIC_OR_DEPARTMENT_OTHER): Payer: PPO | Admitting: Hematology

## 2016-04-21 VITALS — BP 159/88 | HR 75 | Temp 98.0°F | Resp 17 | Ht 66.0 in | Wt 152.6 lb

## 2016-04-21 DIAGNOSIS — D473 Essential (hemorrhagic) thrombocythemia: Secondary | ICD-10-CM

## 2016-04-21 DIAGNOSIS — I10 Essential (primary) hypertension: Secondary | ICD-10-CM

## 2016-04-21 LAB — CBC WITH DIFFERENTIAL/PLATELET
BASO%: 0.4 % (ref 0.0–2.0)
Basophils Absolute: 0 10*3/uL (ref 0.0–0.1)
EOS%: 2.9 % (ref 0.0–7.0)
Eosinophils Absolute: 0.2 10*3/uL (ref 0.0–0.5)
HCT: 37.3 % (ref 34.8–46.6)
HGB: 12.2 g/dL (ref 11.6–15.9)
LYMPH%: 28.7 % (ref 14.0–49.7)
MCH: 30.7 pg (ref 25.1–34.0)
MCHC: 32.6 g/dL (ref 31.5–36.0)
MCV: 94.1 fL (ref 79.5–101.0)
MONO#: 0.6 10*3/uL (ref 0.1–0.9)
MONO%: 8.2 % (ref 0.0–14.0)
NEUT#: 4.3 10*3/uL (ref 1.5–6.5)
NEUT%: 59.8 % (ref 38.4–76.8)
Platelets: 278 10*3/uL (ref 145–400)
RBC: 3.96 10*6/uL (ref 3.70–5.45)
RDW: 14.4 % (ref 11.2–14.5)
WBC: 7.2 10*3/uL (ref 3.9–10.3)
lymph#: 2.1 10*3/uL (ref 0.9–3.3)

## 2016-04-21 LAB — COMPREHENSIVE METABOLIC PANEL
ALT: 19 U/L (ref 0–55)
AST: 24 U/L (ref 5–34)
Albumin: 3.8 g/dL (ref 3.5–5.0)
Alkaline Phosphatase: 67 U/L (ref 40–150)
Anion Gap: 9 mEq/L (ref 3–11)
BUN: 14 mg/dL (ref 7.0–26.0)
CO2: 24 mEq/L (ref 22–29)
Calcium: 9.9 mg/dL (ref 8.4–10.4)
Chloride: 107 mEq/L (ref 98–109)
Creatinine: 0.9 mg/dL (ref 0.6–1.1)
EGFR: 67 mL/min/{1.73_m2} — ABNORMAL LOW (ref >90)
Glucose: 103 mg/dl (ref 70–140)
Potassium: 4.8 mEq/L (ref 3.5–5.1)
Sodium: 140 mEq/L (ref 136–145)
Total Bilirubin: 0.36 mg/dL (ref 0.20–1.20)
Total Protein: 7.4 g/dL (ref 6.4–8.3)

## 2016-04-21 MED ORDER — ANAGRELIDE HCL 0.5 MG PO CAPS: 0.5000 mg | ORAL_CAPSULE | Freq: Two times a day (BID) | ORAL | 1 refills | Status: DC

## 2016-04-21 NOTE — Telephone Encounter (Signed)
Appointments scheduled per 04/21/16 los. AVS report and appointment schedule given to patient, per 04/21/16 los. °

## 2016-05-28 DIAGNOSIS — D473 Essential (hemorrhagic) thrombocythemia: Secondary | ICD-10-CM | POA: Diagnosis not present

## 2016-05-28 DIAGNOSIS — E7800 Pure hypercholesterolemia, unspecified: Secondary | ICD-10-CM | POA: Diagnosis not present

## 2016-05-28 DIAGNOSIS — M899 Disorder of bone, unspecified: Secondary | ICD-10-CM | POA: Diagnosis not present

## 2016-05-28 DIAGNOSIS — N181 Chronic kidney disease, stage 1: Secondary | ICD-10-CM | POA: Diagnosis not present

## 2016-05-28 DIAGNOSIS — I129 Hypertensive chronic kidney disease with stage 1 through stage 4 chronic kidney disease, or unspecified chronic kidney disease: Secondary | ICD-10-CM | POA: Diagnosis not present

## 2016-05-28 DIAGNOSIS — F411 Generalized anxiety disorder: Secondary | ICD-10-CM | POA: Diagnosis not present

## 2016-05-28 DIAGNOSIS — D649 Anemia, unspecified: Secondary | ICD-10-CM | POA: Diagnosis not present

## 2016-05-28 DIAGNOSIS — G4700 Insomnia, unspecified: Secondary | ICD-10-CM | POA: Diagnosis not present

## 2016-05-28 DIAGNOSIS — Z0000 Encounter for general adult medical examination without abnormal findings: Secondary | ICD-10-CM | POA: Diagnosis not present

## 2016-06-10 IMAGING — CT CT HEAD W/O CM
1 of 3 series · 15 of 30 positions shown, 19 images · non-contrast
Comparison: None.

CLINICAL DATA: 68-year-old female with slurred speech and
dysarthria for several weeks

EXAM:
CT HEAD WITHOUT CONTRAST
TECHNIQUE: Contiguous axial images were obtained from the base of the skull
through the vertex without intravenous contrast.

[Series 4: head w/o · axial · non-contrast · 0.45mm/px · z∈[+1306,+1439]mm · 15 of 149 slices shown, 19 images]
[im 8/149  brain]
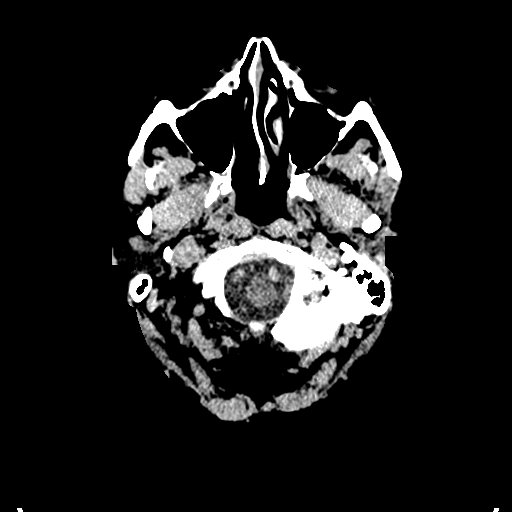
[im 8/149  bone]
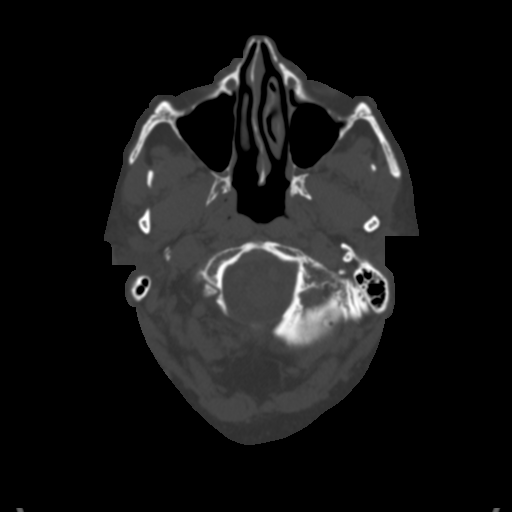
[im 15/149  brain]
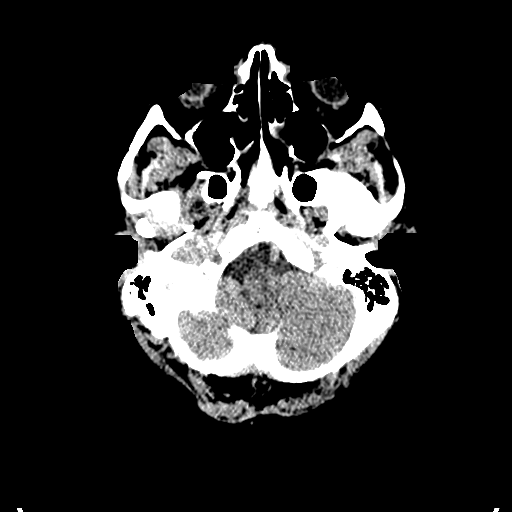
[im 30/149  brain]
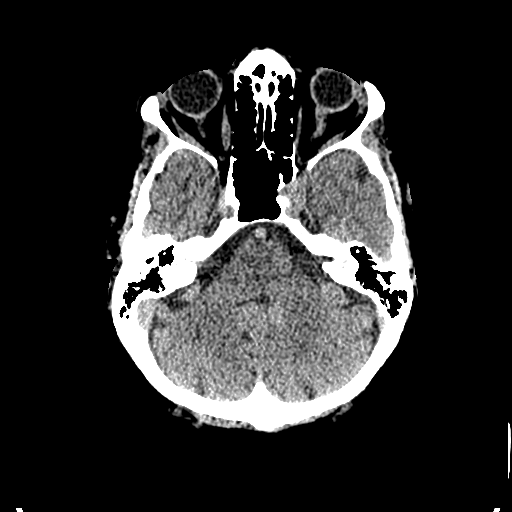
[im 38/149  brain]
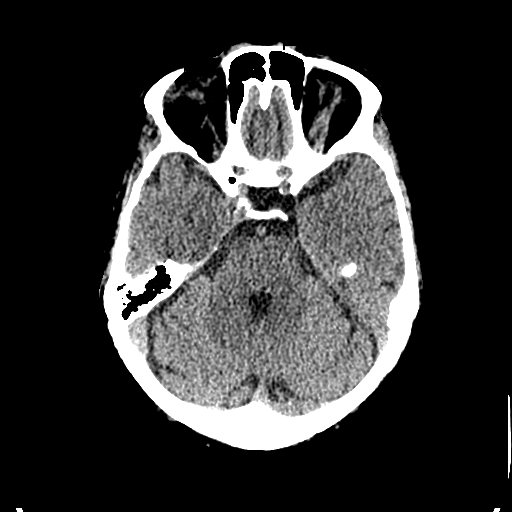
[im 45/149  brain]
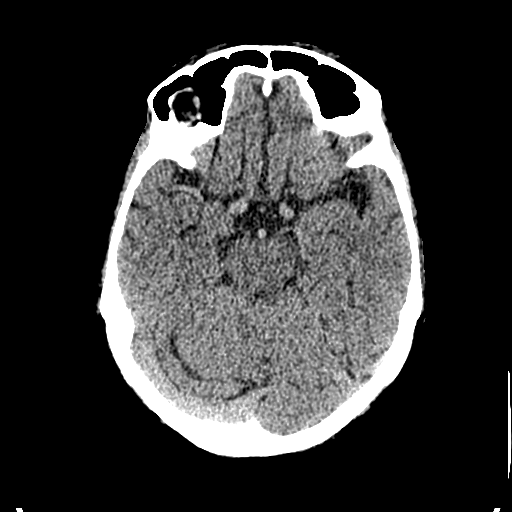
[im 45/149  bone]
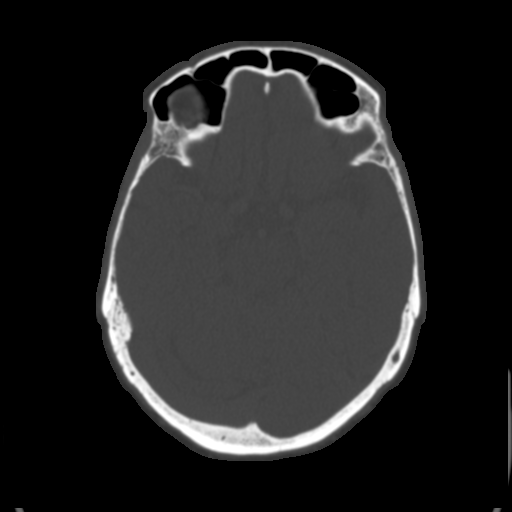
[im 52/149  brain]
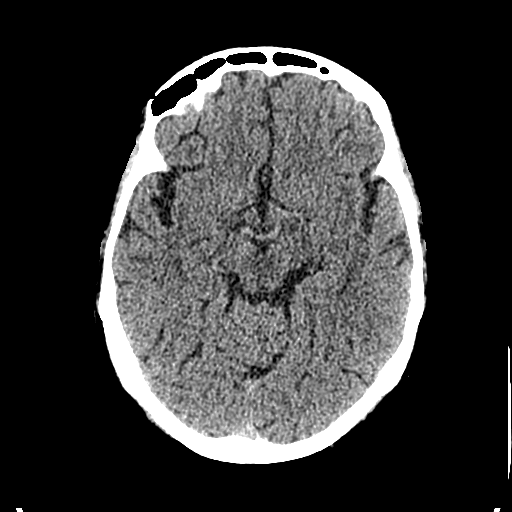
[im 67/149  brain]
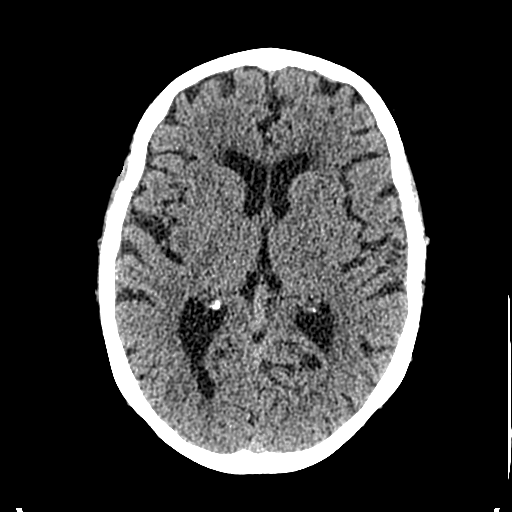
[im 75/149  brain]
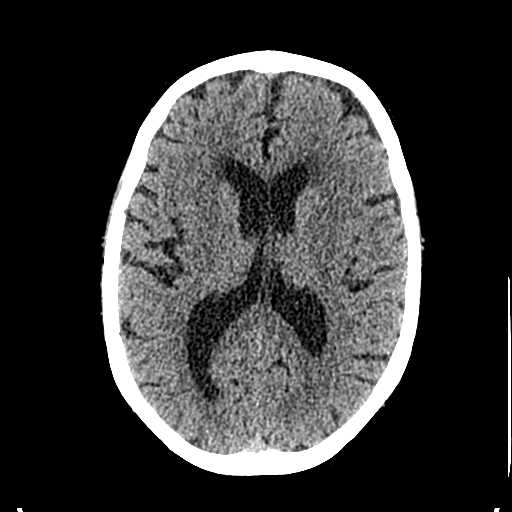
[im 82/149  brain]
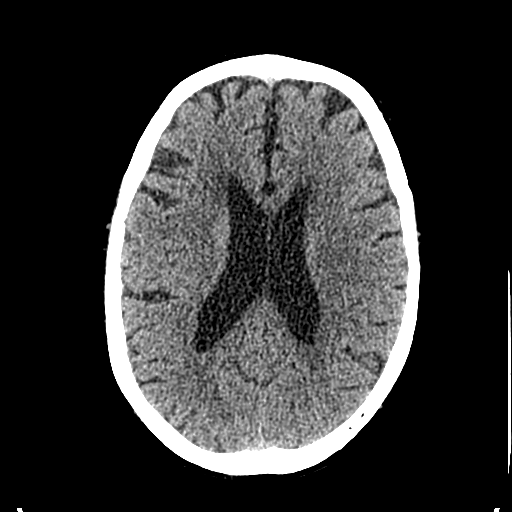
[im 82/149  bone]
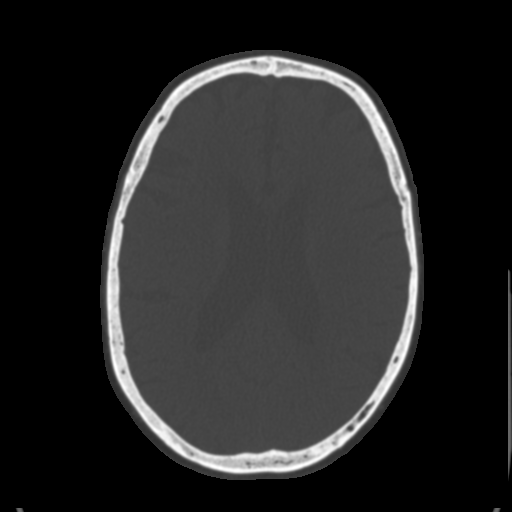
[im 97/149  brain]
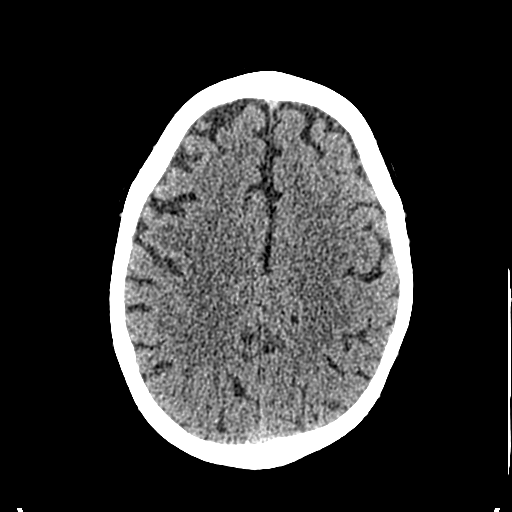
[im 104/149  brain]
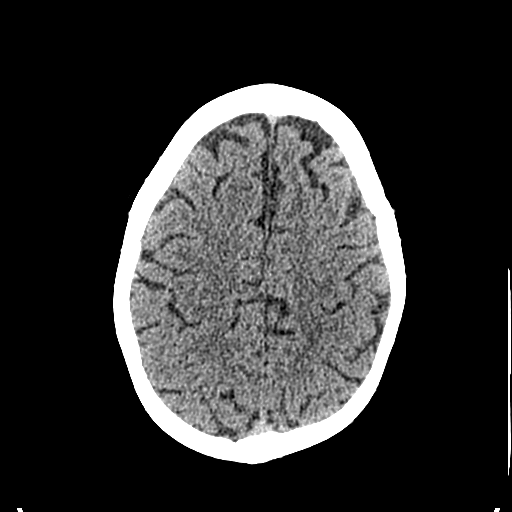
[im 112/149  brain]
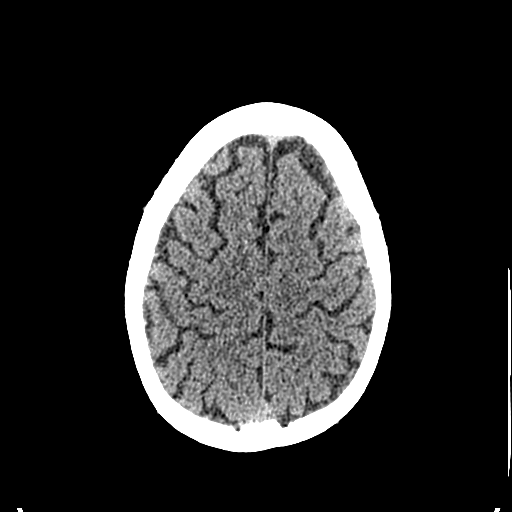
[im 119/149  brain]
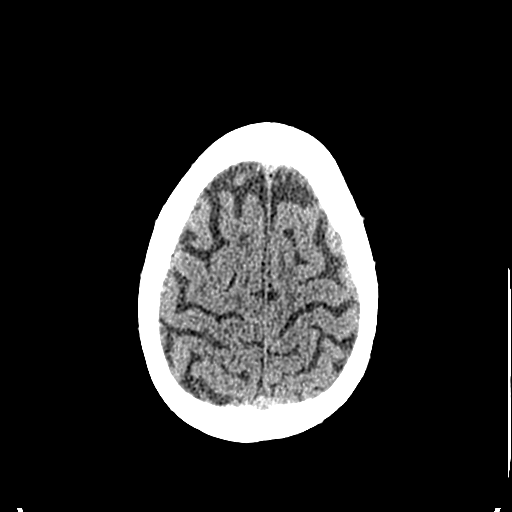
[im 119/149  bone]
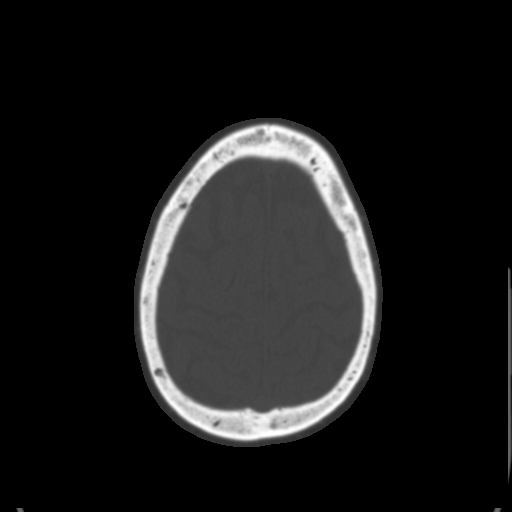
[im 134/149  brain]
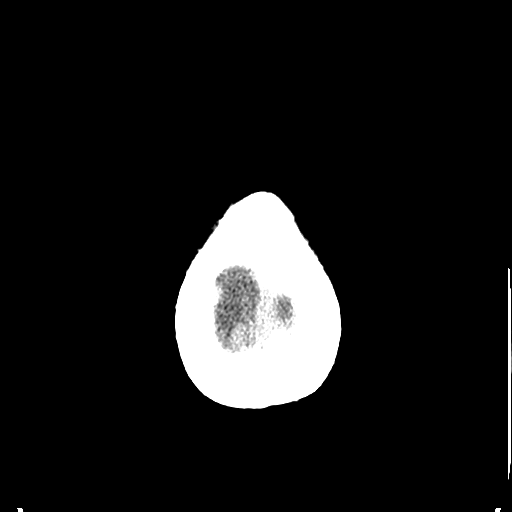
[im 141/149  brain]
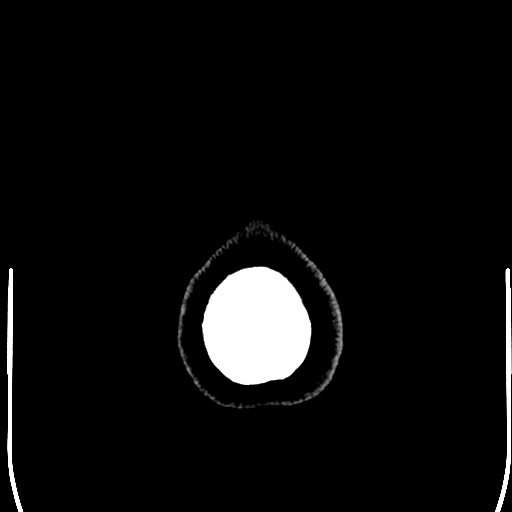

[15 of 30 positions shown; findings below may reference images not displayed]

FINDINGS: Negative for acute intracranial hemorrhage, acute infarction, mass,
mass effect, hydrocephalus or midline shift. Gray-white
differentiation is preserved throughout. Minimal cerebral cortical
atrophy for age. Very mild periventricular white matter
hypoattenuation consistent with chronic microvascular ischemic
disease. Less specific subcortical white matter hypoattenuation
noted in the posterior right parietal lobe (image 19 series 3) and
periventricular left occipital lobe (image 14 series 3).
IMPRESSION: 1. No acute intracranial abnormality.
2. Nonspecific subcortical white matter hypoattenuation in the
posterior right parietal and periventricular left occipital lobes.
The finding is nonspecific but can be seen in the setting of chronic
microvascular ischemia, a demyelinating process such as multiple
sclerosis, vasculitis, complicated migraine headaches, or as the
sequelae of a prior infectious or inflammatory process.
3. Very mild atrophy and chronic microvascular ischemic white matter
disease.

## 2016-06-30 DIAGNOSIS — N181 Chronic kidney disease, stage 1: Secondary | ICD-10-CM | POA: Diagnosis not present

## 2016-06-30 DIAGNOSIS — I129 Hypertensive chronic kidney disease with stage 1 through stage 4 chronic kidney disease, or unspecified chronic kidney disease: Secondary | ICD-10-CM | POA: Diagnosis not present

## 2016-06-30 DIAGNOSIS — J209 Acute bronchitis, unspecified: Secondary | ICD-10-CM | POA: Diagnosis not present

## 2016-07-21 ENCOUNTER — Other Ambulatory Visit (HOSPITAL_BASED_OUTPATIENT_CLINIC_OR_DEPARTMENT_OTHER): Payer: PPO

## 2016-07-21 DIAGNOSIS — D473 Essential (hemorrhagic) thrombocythemia: Secondary | ICD-10-CM | POA: Diagnosis not present

## 2016-07-21 LAB — CBC WITH DIFFERENTIAL/PLATELET
BASO%: 0.5 % (ref 0.0–2.0)
Basophils Absolute: 0 10*3/uL (ref 0.0–0.1)
EOS%: 2.9 % (ref 0.0–7.0)
Eosinophils Absolute: 0.2 10*3/uL (ref 0.0–0.5)
HCT: 36.2 % (ref 34.8–46.6)
HGB: 12.4 g/dL (ref 11.6–15.9)
LYMPH%: 24.4 % (ref 14.0–49.7)
MCH: 32.3 pg (ref 25.1–34.0)
MCHC: 34.2 g/dL (ref 31.5–36.0)
MCV: 94.3 fL (ref 79.5–101.0)
MONO#: 0.6 10*3/uL (ref 0.1–0.9)
MONO%: 7.4 % (ref 0.0–14.0)
NEUT#: 5.5 10*3/uL (ref 1.5–6.5)
NEUT%: 64.8 % (ref 38.4–76.8)
Platelets: 255 10*3/uL (ref 145–400)
RBC: 3.84 10*6/uL (ref 3.70–5.45)
RDW: 13.9 % (ref 11.2–14.5)
WBC: 8.4 10*3/uL (ref 3.9–10.3)
lymph#: 2.1 10*3/uL (ref 0.9–3.3)

## 2016-09-09 ENCOUNTER — Other Ambulatory Visit: Payer: Self-pay | Admitting: Family Medicine

## 2016-09-09 DIAGNOSIS — Z1231 Encounter for screening mammogram for malignant neoplasm of breast: Secondary | ICD-10-CM

## 2016-09-30 ENCOUNTER — Ambulatory Visit
Admission: RE | Admit: 2016-09-30 | Discharge: 2016-09-30 | Disposition: A | Discharge status: Home / Self Care | Payer: PPO | Source: Ambulatory Visit | Attending: Family Medicine | Admitting: Family Medicine

## 2016-09-30 DIAGNOSIS — Z1231 Encounter for screening mammogram for malignant neoplasm of breast: Secondary | ICD-10-CM

## 2016-10-02 ENCOUNTER — Other Ambulatory Visit: Payer: Self-pay | Admitting: Dermatology

## 2016-10-02 DIAGNOSIS — Z85828 Personal history of other malignant neoplasm of skin: Secondary | ICD-10-CM | POA: Diagnosis not present

## 2016-10-02 DIAGNOSIS — C44619 Basal cell carcinoma of skin of left upper limb, including shoulder: Secondary | ICD-10-CM | POA: Diagnosis not present

## 2016-10-02 DIAGNOSIS — D1801 Hemangioma of skin and subcutaneous tissue: Secondary | ICD-10-CM | POA: Diagnosis not present

## 2016-10-02 DIAGNOSIS — L570 Actinic keratosis: Secondary | ICD-10-CM | POA: Diagnosis not present

## 2016-10-02 DIAGNOSIS — L814 Other melanin hyperpigmentation: Secondary | ICD-10-CM | POA: Diagnosis not present

## 2016-10-02 DIAGNOSIS — L821 Other seborrheic keratosis: Secondary | ICD-10-CM | POA: Diagnosis not present

## 2016-10-16 NOTE — Progress Notes (Signed)
is [facility]  Telephone:([phone] Fax:(336) [phone]  Clinic follow Up Note   Patient Care Team: Mayra Neer, MD as PCP - General (Family Medicine) 10/20/2016  CHIEF COMPLAINTS:  Follow-up essential thrombocythemia    Essential thrombocythemia ([place])   08/13/2014 - 08/14/2014 Hospital Admission    Patient was admitted for acute onset headache and aphasia, spontaneously resolved quickly. Probable TIA versus migraine.      10/17/2014 Miscellaneous    JAK2 V617F mutation (+),       10/24/2014 Initial Diagnosis    Essential thrombocythemia. Pt presented with thrombocytosis with platelet [phone] since 2013.       10/24/2014 Bone Marrow Biopsy    Hypercellular marrow with increased and atypical megakaryocytes. No significant fibrosis. Myeloid and erythroid elements are not overly increased. The overall findings are suggestive of a myeloproliferative neoplasm, especially essential thrombocythemia.       11/06/2014 -  Chemotherapy    Anagrelide 0.5 mg twice daily        HISTORY OF PRESENTING ILLNESS: 10/16/14 Yvonne Rose 71 y.o. female with past medical history of hypertension and anxiety, recent episode of TIA versus complicated migraine, is here because of thrombocytosis.  She had a mechanical fall at home in mid Feb, 2016. She slipped on the front steps of her house in a raining day. She had headaches after worse. She did not seek immediate medical attention. She presents to [facility] with sudden onset aphasia and had right arm weakness, which last for about 10 mins and spontaneously resolved. Her headaches got worse during the episode also. She denies any vision change or other neurological symptoms. She was evaluated in the hospital included CT brain, MRI brain, echocardiogram, carotid Doppler and labs that a wall or unremarkable except for elevated plt count at 616K. she was evaluated by neurology and subsequently discharged home.   She  has had elevated platelete since 2013,  with platelet count 487K. Her WBC and hemoglobin has been normal. Her repeated a CBC in July 2013, August 2016 and March 2016 showed gradually increased platelet count at [place], 543K and a 603K. She has been asymptomatic. She denies any other episodes of thrombosis. She feels well overall, denies any symptoms. She lives with her husband and remains to be physically active at home.  CURRENT THERAPY: Anagrelide 0.41m twice daily  INTERIM HISTORY:  Ms. Hauth returns for follow-up. She presents to the clinic today alone. She reports her son died in J27-Jan-2024from Huntington's disease. She is still compliant with anagrelide. She is taking 325 mg baby aspirin. She reports she is tolerating well all her medications. No noticeable side effects. She denies any pain, leg swollen, dyspnea, or bleeding or other new symptoms.  MEDICAL HISTORY:  Past Medical History:  Diagnosis Date  . Anxiety   . Hypertension   . PONV (postoperative nausea and vomiting)     SURGICAL HISTORY: Past Surgical History:  Procedure Laterality Date  . ABDOMINAL HYSTERECTOMY    . BACK SURGERY    . DILATION AND CURETTAGE OF UTERUS      SOCIAL HISTORY: Social History   Social History  . Marital status: Married    Spouse name: N/A  . Number of children: N/A  . Years of education: N/A   Occupational History  . Not on file.   Social History Main Topics  . Smoking status: Never Smoker  . Smokeless tobacco: Never Used  . Alcohol use 1.2 - 1.8 oz/week    2 -  3 Cans of beer per week     Comment: daily   . Drug use: No  . Sexual activity: Yes    Birth control/ protection: Post-menopausal   Other Topics Concern  . Not on file   Social History Narrative  . No narrative on file    FAMILY HISTORY: Family History  Problem Relation Age of Onset  . Aneurysm Mother   . Hypertension Father   . Alcoholism Father   . Lupus Sister     ALLERGIES:  is allergic to sulfa  antibiotics.  MEDICATIONS:  Current Outpatient Prescriptions  Medication Sig Dispense Refill  . acetaminophen (TYLENOL) 500 MG tablet Take 500 mg by mouth every 8 (eight) hours as needed for mild pain.     Marland Kitchen amitriptyline (ELAVIL) 25 MG tablet Take 25 mg by mouth at bedtime.    Marland Kitchen anagrelide (AGRYLIN) 0.5 MG capsule Take 1 capsule (0.5 mg total) by mouth 2 (two) times daily. 180 capsule 1  . aspirin 325 MG tablet Take 1 tablet (325 mg total) by mouth daily. 30 tablet 2  . lisinopril (PRINIVIL,ZESTRIL) 20 MG tablet Take 20 mg by mouth every morning.   0  . metoprolol succinate (TOPROL-XL) 50 MG 24 hr tablet Take 50 mg by mouth every evening.  0  . Multiple Vitamins-Minerals (MULTIVITAMIN WITH MINERALS) tablet Take 1 tablet by mouth daily.    Marland Kitchen omeprazole (PRILOSEC) 20 MG capsule Take 20 mg by mouth daily.    . pravastatin (PRAVACHOL) 80 MG tablet Take 1 tablet (80 mg total) by mouth daily. 30 tablet 2   No current facility-administered medications for this visit.     REVIEW OF SYSTEMS:   Constitutional: Denies fevers, chills or abnormal night sweats Eyes: Denies blurriness of vision, double vision or watery eyes Ears, nose, mouth, throat, and face: Denies mucositis or sore throat Respiratory: Denies cough, dyspnea or wheezes Cardiovascular: Denies palpitation, chest discomfort or lower extremity swelling Gastrointestinal:  Denies nausea, heartburn or change in bowel habits Skin: Denies abnormal skin rashes Lymphatics: Denies new lymphadenopathy or easy bruising Neurological:Denies numbness, tingling or new weaknesses Behavioral/Psych: Mood is stable, no new changes  All other systems were reviewed with the patient and are negative.  PHYSICAL EXAMINATION: ECOG PERFORMANCE STATUS: 0 - Asymptomatic  Vitals:   10/20/16 0811  BP: (!) 152/72  Pulse: 68  Resp: 18  Temp: 98.3 F (36.8 C)   Filed Weights   10/20/16 0811  Weight: 152 lb 3.2 oz (69 kg)    GENERAL:alert, no distress  and comfortable SKIN: skin color, texture, turgor are normal, no rashes or significant lesions EYES: normal, conjunctiva are pink and non-injected, sclera clear OROPHARYNX:no exudate, no erythema and lips, buccal mucosa, and tongue normal  NECK: supple, thyroid normal size, non-tender, without nodularity LYMPH:  no palpable lymphadenopathy in the cervical, axillary or inguinal LUNGS: clear to auscultation and percussion with normal breathing effort HEART: regular rate & rhythm and no murmurs and no lower extremity edema ABDOMEN:abdomen soft, non-tender and normal bowel sounds Musculoskeletal:no cyanosis of digits and no clubbing  PSYCH: alert & oriented x 3 with fluent speech NEURO: no focal motor/sensory deficits  LABORATORY DATA:  I have reviewed the data as listed CBC Latest Ref Rng & Units 10/20/2016 07/21/2016 04/21/2016  WBC 3.9 - 10.3 10e3/uL 8.6 8.4 7.2  Hemoglobin 11.6 - 15.9 g/dL 12.6 12.4 12.2  Hematocrit 34.8 - 46.6 % 37.9 36.2 37.3  Platelets 145 - 400 10e3/uL 272 255 278  CMP Latest Ref Rng & Units 10/20/2016 04/21/2016 10/22/2015  Glucose 70 - 140 mg/dl 97 103 97  BUN 7.0 - 26.0 mg/dL 13.3 14.0 13.9  Creatinine 0.6 - 1.1 mg/dL 0.8 0.9 0.9  Sodium 136 - 145 mEq/L 141 140 140  Potassium 3.5 - 5.1 mEq/L 4.7 4.8 4.9  Chloride 96 - 112 mmol/L - - -  CO2 22 - 29 mEq/L 26 24 26   Calcium 8.4 - 10.4 mg/dL 10.0 9.9 10.3  Total Protein 6.4 - 8.3 g/dL 7.4 7.4 7.5  Total Bilirubin 0.20 - 1.20 mg/dL 0.41 0.36 0.31  Alkaline Phos 40 - 150 U/L 78 67 73  AST 5 - 34 U/L 22 24 28   ALT 0 - 55 U/L 17 19 24      PATHOLOGY REPORT  Diagnosis Bone Marrow, Aspirate,Biopsy, and Clot, right iliac - HYPERCELLULAR MARROW WITH INCREASED AND ATYPICAL MEGAKARYOCYTES. - SEE COMMENT. PERIPHERAL BLOOD: - THROMBOCYTOSIS. Diagnosis Note The marrow is hypercellular with increased megakaryocytes. Many of the megakaryocytes are atypical with larger forms and hyperlobated nuclei. There is no significant  fibrosis. Myeloid and erythroid elements are not overtly increased. The patient is noted to have a persistent thrombocytosis. While reactive causes must be excluded, the overall findings are suggestive of a myeloproliferative neoplasm, specifically essential thrombocythemia. Correlation with molecular testing for JAK-2 and BCR/ABL is recommended.    RADIOGRAPHIC STUDIES: I have personally reviewed the radiological images as listed and agreed with the findings in the report.   ASSESSMENT & PLAN:  71 y.o. Caucasian female, with past medical history of hypertension, anxiety, recent episode of TIA, was negative workup except elevated platelet count.  1. Essential thrombocythemia, JAK2 V617F (+) -I previously reviewed her outside lab results, her platelet count has been slowly trending up since 2013. The rest of her CBC are normal. -She does not appear to have obvious cause for reactive thrombocytosis, such as iron deficient anemia, chronic infection or chronic inflammation. -I previously reviewed her test results including JAK2 mutation and bone marrow biopsy results. Those tests are consistent with essential thrombocythemia. -The natural history of ET was previously  reviewed with patient, it is usually a benign and indolent disease, not curable, major complications is thrombosis, some patients will develop myelofibrosis at late stage. -She has been on anagrelide, and her platelet count has previously been normal. She is tolerating treatment very well. We'll continue -Continue aspirin 325 mg daily -We reviewed and discussed her labs and they are adequate to continue treatment    2. Hypertension and anxiety -She will continue follow-up with her primary care physician  PLAN: -labs reviewed, thrombocytosis is well controlled, we'll continue anagrelide 0.5 mg twice daily -CBC every 3 months -I'll see her back in 6 months -I refilled her anagrelide today  All questions were answered. The  patient knows to call the clinic with any problems, questions or concerns. I spent 15 minutes counseling the patient face to face. The total time spent in the appointment was 20 minutes and more than 50% was on counseling.  This document serves as a record of services personally performed by Truitt Merle, MD. It was created on her behalf by Joslyn Devon, a trained medical scribe. The creation of this record is based on the scribe's personal observations and the provider's statements to them. This document has been checked and approved by the attending provider.     Truitt Merle, MD 10/20/2016 10:39 AM

## 2016-10-20 ENCOUNTER — Encounter: Payer: Self-pay | Admitting: Hematology

## 2016-10-20 ENCOUNTER — Other Ambulatory Visit (HOSPITAL_BASED_OUTPATIENT_CLINIC_OR_DEPARTMENT_OTHER): Payer: PPO

## 2016-10-20 ENCOUNTER — Ambulatory Visit (HOSPITAL_BASED_OUTPATIENT_CLINIC_OR_DEPARTMENT_OTHER): Payer: PPO | Admitting: Hematology

## 2016-10-20 ENCOUNTER — Telehealth: Payer: Self-pay | Admitting: Hematology

## 2016-10-20 VITALS — BP 152/72 | HR 68 | Temp 98.3°F | Resp 18 | Ht 66.0 in | Wt 152.2 lb

## 2016-10-20 DIAGNOSIS — D473 Essential (hemorrhagic) thrombocythemia: Secondary | ICD-10-CM

## 2016-10-20 DIAGNOSIS — I10 Essential (primary) hypertension: Secondary | ICD-10-CM

## 2016-10-20 LAB — COMPREHENSIVE METABOLIC PANEL
ALT: 17 U/L (ref 0–55)
AST: 22 U/L (ref 5–34)
Albumin: 3.8 g/dL (ref 3.5–5.0)
Alkaline Phosphatase: 78 U/L (ref 40–150)
Anion Gap: 8 mEq/L (ref 3–11)
BUN: 13.3 mg/dL (ref 7.0–26.0)
CO2: 26 mEq/L (ref 22–29)
Calcium: 10 mg/dL (ref 8.4–10.4)
Chloride: 106 mEq/L (ref 98–109)
Creatinine: 0.8 mg/dL (ref 0.6–1.1)
EGFR: 72 mL/min/{1.73_m2} — ABNORMAL LOW (ref >90)
Glucose: 97 mg/dl (ref 70–140)
Potassium: 4.7 mEq/L (ref 3.5–5.1)
Sodium: 141 mEq/L (ref 136–145)
Total Bilirubin: 0.41 mg/dL (ref 0.20–1.20)
Total Protein: 7.4 g/dL (ref 6.4–8.3)

## 2016-10-20 LAB — CBC WITH DIFFERENTIAL/PLATELET
BASO%: 0.7 % (ref 0.0–2.0)
Basophils Absolute: 0.1 10*3/uL (ref 0.0–0.1)
EOS%: 3.4 % (ref 0.0–7.0)
Eosinophils Absolute: 0.3 10*3/uL (ref 0.0–0.5)
HCT: 37.9 % (ref 34.8–46.6)
HGB: 12.6 g/dL (ref 11.6–15.9)
LYMPH%: 21 % (ref 14.0–49.7)
MCH: 31 pg (ref 25.1–34.0)
MCHC: 33.1 g/dL (ref 31.5–36.0)
MCV: 93.7 fL (ref 79.5–101.0)
MONO#: 0.7 10*3/uL (ref 0.1–0.9)
MONO%: 8.1 % (ref 0.0–14.0)
NEUT#: 5.7 10*3/uL (ref 1.5–6.5)
NEUT%: 66.8 % (ref 38.4–76.8)
Platelets: 272 10*3/uL (ref 145–400)
RBC: 4.05 10*6/uL (ref 3.70–5.45)
RDW: 14.3 % (ref 11.2–14.5)
WBC: 8.6 10*3/uL (ref 3.9–10.3)
lymph#: 1.8 10*3/uL (ref 0.9–3.3)

## 2016-10-20 MED ORDER — ANAGRELIDE HCL 0.5 MG PO CAPS: 0.5000 mg | ORAL_CAPSULE | Freq: Two times a day (BID) | ORAL | 1 refills | Status: DC

## 2016-10-20 NOTE — Telephone Encounter (Signed)
Appointments scheduled per 10/20/16 los. Patient was given a copy of the AVS report and appointment schedule per 10/20/16 los. °

## 2016-12-23 DIAGNOSIS — F411 Generalized anxiety disorder: Secondary | ICD-10-CM | POA: Diagnosis not present

## 2016-12-23 DIAGNOSIS — D473 Essential (hemorrhagic) thrombocythemia: Secondary | ICD-10-CM | POA: Diagnosis not present

## 2016-12-23 DIAGNOSIS — E7800 Pure hypercholesterolemia, unspecified: Secondary | ICD-10-CM | POA: Diagnosis not present

## 2016-12-23 DIAGNOSIS — I129 Hypertensive chronic kidney disease with stage 1 through stage 4 chronic kidney disease, or unspecified chronic kidney disease: Secondary | ICD-10-CM | POA: Diagnosis not present

## 2016-12-23 DIAGNOSIS — N181 Chronic kidney disease, stage 1: Secondary | ICD-10-CM | POA: Diagnosis not present

## 2017-01-20 ENCOUNTER — Other Ambulatory Visit (HOSPITAL_BASED_OUTPATIENT_CLINIC_OR_DEPARTMENT_OTHER): Payer: PPO

## 2017-01-20 ENCOUNTER — Other Ambulatory Visit: Payer: Self-pay | Admitting: Hematology

## 2017-01-20 DIAGNOSIS — D473 Essential (hemorrhagic) thrombocythemia: Secondary | ICD-10-CM | POA: Diagnosis not present

## 2017-01-20 DIAGNOSIS — I10 Essential (primary) hypertension: Secondary | ICD-10-CM

## 2017-01-20 LAB — COMPREHENSIVE METABOLIC PANEL
ALT: 22 U/L (ref 0–55)
AST: 28 U/L (ref 5–34)
Albumin: 4 g/dL (ref 3.5–5.0)
Alkaline Phosphatase: 74 U/L (ref 40–150)
Anion Gap: 7 mEq/L (ref 3–11)
BUN: 16.7 mg/dL (ref 7.0–26.0)
CO2: 26 mEq/L (ref 22–29)
Calcium: 10.2 mg/dL (ref 8.4–10.4)
Chloride: 106 mEq/L (ref 98–109)
Creatinine: 0.8 mg/dL (ref 0.6–1.1)
EGFR: 69 mL/min/{1.73_m2} — ABNORMAL LOW (ref >90)
Glucose: 97 mg/dl (ref 70–140)
Potassium: 5 mEq/L (ref 3.5–5.1)
Sodium: 140 mEq/L (ref 136–145)
Total Bilirubin: 0.53 mg/dL (ref 0.20–1.20)
Total Protein: 7.5 g/dL (ref 6.4–8.3)

## 2017-01-21 ENCOUNTER — Encounter: Payer: Self-pay | Admitting: Hematology

## 2017-01-25 ENCOUNTER — Encounter: Payer: Self-pay | Admitting: Hematology

## 2017-04-20 ENCOUNTER — Telehealth: Payer: Self-pay | Admitting: Hematology

## 2017-04-20 NOTE — Progress Notes (Signed)
is [facility]  Telephone:(336) [phone] Fax:(336) [phone]  Clinic follow Up Note   Patient Care Team: Mayra Neer, MD as PCP - General (Family Medicine) 04/22/2017  CHIEF COMPLAINTS:  Follow-up essential thrombocythemia    Essential thrombocythemia ([place])   08/13/2014 - 08/14/2014 Hospital Admission    Patient was admitted for acute onset headache and aphasia, spontaneously resolved quickly. Probable TIA versus migraine.      10/17/2014 Miscellaneous    JAK2 V617F mutation (+),       10/24/2014 Initial Diagnosis    Essential thrombocythemia. Pt presented with thrombocytosis with platelet [phone] since 2013.       10/24/2014 Bone Marrow Biopsy    Hypercellular marrow with increased and atypical megakaryocytes. No significant fibrosis. Myeloid and erythroid elements are not overly increased. The overall findings are suggestive of a myeloproliferative neoplasm, especially essential thrombocythemia.       11/06/2014 -  Chemotherapy    Anagrelide 0.5 mg twice daily        HISTORY OF PRESENTING ILLNESS: 10/16/14 Yvonne Rose 71 y.o. female with past medical history of hypertension and anxiety, recent episode of TIA versus complicated migraine, is here because of thrombocytosis.  She had a mechanical fall at home in mid Feb, 2016. She slipped on the front steps of her house in a raining day. She had headaches after worse. She did not seek immediate medical attention. She presents to [facility] with sudden onset aphasia and had right arm weakness, which last for about 10 mins and spontaneously resolved. Her headaches got worse during the episode also. She denies any vision change or other neurological symptoms. She was evaluated in the hospital included CT brain, MRI brain, echocardiogram, carotid Doppler and labs that a wall or unremarkable except for elevated plt count at 616K. she was evaluated by neurology and subsequently discharged home.   She  has had elevated platelete since 2013,  with platelet count 487K. Her WBC and hemoglobin has been normal. Her repeated a CBC in July 2013, August 2016 and March 2016 showed gradually increased platelet count at [place], 543K and a 603K. She has been asymptomatic. She denies any other episodes of thrombosis. She feels well overall, denies any symptoms. She lives with her husband and remains to be physically active at home.  CURRENT THERAPY:  Anagrelide 0.'5mg'$  twice daily  INTERIM HISTORY:   Yvonne Rose returns for follow-up. She was last sen by me 6 months ago. She presents to the clinic today reporting no new changes or hospital visits. She notes she tolerates her Anagrelide. She notes her BP is usually normal at home and was elevated her but was coming down afterward. She takes aspirin '325mg'$  and denies any issues with it. She has gotten her flu shot and she is waiting gon the new shingles shot to come in. Overall she is doing well and wants to continue with Anagrelide.     MEDICAL HISTORY:  Past Medical History:  Diagnosis Date  . Anxiety   . Hypertension   . PONV (postoperative nausea and vomiting)     SURGICAL HISTORY: Past Surgical History:  Procedure Laterality Date  . ABDOMINAL HYSTERECTOMY    . BACK SURGERY    . DILATION AND CURETTAGE OF UTERUS      SOCIAL HISTORY: Social History   Socioeconomic History  . Marital status: Married    Spouse name: Not on file  . Number of children: Not on file  . Years of education: Not on  file  . Highest education level: Not on file  Social Needs  . Financial resource strain: Not on file  . Food insecurity - worry: Not on file  . Food insecurity - inability: Not on file  . Transportation needs - medical: Not on file  . Transportation needs - non-medical: Not on file  Occupational History  . Not on file  Tobacco Use  . Smoking status: Never Smoker  . Smokeless tobacco: Never Used  Substance and Sexual Activity  . Alcohol use: Yes     Alcohol/week: 1.2 - 1.8 oz    Types: 2 - 3 Cans of beer per week    Comment: daily   . Drug use: No  . Sexual activity: Yes    Birth control/protection: Post-menopausal  Other Topics Concern  . Not on file  Social History Narrative  . Not on file    FAMILY HISTORY: Family History  Problem Relation Age of Onset  . Aneurysm Mother   . Hypertension Father   . Alcoholism Father   . Lupus Sister     ALLERGIES:  is allergic to sulfa antibiotics.  MEDICATIONS:  Current Outpatient Medications  Medication Sig Dispense Refill  . acetaminophen (TYLENOL) 500 MG tablet Take 500 mg by mouth every 8 (eight) hours as needed for mild pain.     Marland Kitchen amitriptyline (ELAVIL) 25 MG tablet Take 25 mg by mouth at bedtime.    Marland Kitchen anagrelide (AGRYLIN) 0.5 MG capsule Take 1 capsule (0.5 mg total) 2 (two) times daily by mouth. 180 capsule 1  . aspirin 325 MG tablet Take 1 tablet (325 mg total) by mouth daily. 30 tablet 2  . lisinopril (PRINIVIL,ZESTRIL) 20 MG tablet Take 20 mg by mouth every morning.   0  . metoprolol succinate (TOPROL-XL) 100 MG 24 hr tablet Take 1 tablet by mouth daily.    Marland Kitchen omeprazole (PRILOSEC) 20 MG capsule Take 20 mg by mouth daily.    . pravastatin (PRAVACHOL) 80 MG tablet Take 1 tablet (80 mg total) by mouth daily. 30 tablet 2   No current facility-administered medications for this visit.     REVIEW OF SYSTEMS:   Constitutional: Denies fevers, chills or abnormal night sweats Eyes: Denies blurriness of vision, double vision or watery eyes Ears, nose, mouth, throat, and face: Denies mucositis or sore throat Respiratory: Denies cough, dyspnea or wheezes Cardiovascular: Denies palpitation, chest discomfort or lower extremity swelling Gastrointestinal:  Denies nausea, heartburn or change in bowel habits Skin: Denies abnormal skin rashes Lymphatics: Denies new lymphadenopathy or easy bruising Neurological:Denies numbness, tingling or new weaknesses Behavioral/Psych: Mood is stable,  no new changes  All other systems were reviewed with the patient and are negative.  PHYSICAL EXAMINATION: ECOG PERFORMANCE STATUS: 0 - Asymptomatic  Vitals:   04/22/17 1335 04/22/17 1347  BP: (!) 184/88 (!) 168/79  Pulse: 70 60  Resp: 18   Temp: 98.6 F (37 C)   SpO2: 97%    Filed Weights   04/22/17 1335  Weight: 150 lb 1.6 oz (68.1 kg)    GENERAL:alert, no distress and comfortable SKIN: skin color, texture, turgor are normal, no rashes or significant lesions EYES: normal, conjunctiva are pink and non-injected, sclera clear OROPHARYNX:no exudate, no erythema and lips, buccal mucosa, and tongue normal  NECK: supple, thyroid normal size, non-tender, without nodularity LYMPH:  no palpable lymphadenopathy in the cervical, axillary or inguinal LUNGS: clear to auscultation and percussion with normal breathing effort HEART: regular rate & rhythm and no murmurs and  no lower extremity edema ABDOMEN:abdomen soft, non-tender and normal bowel sounds Musculoskeletal:no cyanosis of digits and no clubbing  PSYCH: alert & oriented x 3 with fluent speech NEURO: no focal motor/sensory deficits  LABORATORY DATA:  I have reviewed the data as listed CBC Latest Ref Rng & Units 04/22/2017 10/20/2016 07/21/2016  WBC 3.9 - 10.3 10e3/uL 8.1 8.6 8.4  Hemoglobin 11.6 - 15.9 g/dL 11.8 12.6 12.4  Hematocrit 34.8 - 46.6 % 36.8 37.9 36.2  Platelets 145 - 400 10e3/uL 288 272 255    CMP Latest Ref Rng & Units 04/22/2017 01/20/2017 10/20/2016  Glucose 70 - 140 mg/dl 104 97 97  BUN 7.0 - 26.0 mg/dL 16.1 16.7 13.3  Creatinine 0.6 - 1.1 mg/dL 0.8 0.8 0.8  Sodium 136 - 145 mEq/L 141 140 141  Potassium 3.5 - 5.1 mEq/L 4.2 5.0 4.7  Chloride 96 - 112 mmol/L - - -  CO2 22 - 29 mEq/L _0 Calcium 8.4 - 10.4 mg/dL 10.0 10.2 10.0  Total Protein 6.4 - 8.3 g/dL 7.6 7.5 7.4  Total Bilirubin 0.20 - 1.20 mg/dL 0.24 0.53 0.41  Alkaline Phos 40 - 150 U/L 76 74 78  AST 5 - 34 U/L _1 ALT 0 - 55 U/L _2 PATHOLOGY REPORT  Diagnosis Bone Marrow, Aspirate,Biopsy, and Clot, right iliac - HYPERCELLULAR MARROW WITH INCREASED AND ATYPICAL MEGAKARYOCYTES. - SEE COMMENT. PERIPHERAL BLOOD: - THROMBOCYTOSIS. Diagnosis Note The marrow is hypercellular with increased megakaryocytes. Many of the megakaryocytes are atypical with larger forms and hyperlobated nuclei. There is no significant fibrosis. Myeloid and erythroid elements are not overtly increased. The patient is noted to have a persistent thrombocytosis. While reactive causes must be excluded, the overall findings are suggestive of a myeloproliferative neoplasm, specifically essential thrombocythemia. Correlation with molecular testing for JAK-2 and BCR/ABL is recommended.    RADIOGRAPHIC STUDIES: I have personally reviewed the radiological images as listed and agreed with the findings in the report.   ASSESSMENT & PLAN:  71 y.o. Caucasian female, with past medical history of hypertension, anxiety, recent episode of TIA, was negative workup except elevated platelet count.  1. Essential thrombocythemia, JAK2 V617F (+) -I previously reviewed her outside lab results, her platelet count has been slowly trending up since 2013. The rest of her CBC are normal. -She does not appear to have obvious cause for reactive thrombocytosis, such as iron deficient anemia, chronic infection or chronic inflammation. -I previously reviewed her test results including JAK2 mutation and bone marrow biopsy results. Those tests are consistent with essential thrombocythemia. -The natural history of ET was previously reviewed with patient, it is usually a benign and indolent disease, not curable, major complications is thrombosis, some patients will develop myelofibrosis at late stage. -treatment options including Hydrea and anagrelide were discussed with her, potential benefits and side effects of each agents discussed with her, including small leukogenic risk  from Hydrea, and slightly increased vascular risk from anagrelide, etc.  She decided to take anagrelide. -Has been tolerating anagrelide very well, her platelet counts are within normal limits. -Labs reviewed today, her Hg is 11.8 and her PLT are 288K. Overall her CBC and CMP are WNL.  -will monitor her cardiovascular risks when she is on anagrelide. I encouraged her to control her HTN and monitor her heart function.  -Continue aspirin 325 mg daily -lab every 3 months. F/u in 6 months   2. Hypertension and anxiety -She will continue follow-up with  her primary care physician  PLAN: -Refill Anagrelide  -Lab in 3 months  -Lab and f/u in 6 months    All questions were answered. The patient knows to call the clinic with any problems, questions or concerns. I spent 15 minutes counseling the patient face to face. The total time spent in the appointment was 20 minutes and more than 50% was on counseling.  This document serves as a record of services personally performed by Truitt Merle, MD. It was created on her behalf by Joslyn Devon, a trained medical scribe. The creation of this record is based on the scribe's personal observations and the provider's statements to them.    I have reviewed the above documentation for accuracy and completeness, and I agree with the above.     Truitt Merle, MD 04/22/2017 8:32 PM

## 2017-04-20 NOTE — Telephone Encounter (Signed)
Left message confirming change in lab/fu 11/7 @ 12:45pm.

## 2017-04-22 ENCOUNTER — Telehealth: Payer: Self-pay | Admitting: Hematology

## 2017-04-22 ENCOUNTER — Other Ambulatory Visit (HOSPITAL_BASED_OUTPATIENT_CLINIC_OR_DEPARTMENT_OTHER): Payer: PPO

## 2017-04-22 ENCOUNTER — Ambulatory Visit (HOSPITAL_BASED_OUTPATIENT_CLINIC_OR_DEPARTMENT_OTHER): Payer: PPO | Admitting: Hematology

## 2017-04-22 VITALS — BP 168/79 | HR 60 | Temp 98.6°F | Resp 18 | Ht 66.0 in | Wt 150.1 lb

## 2017-04-22 DIAGNOSIS — D473 Essential (hemorrhagic) thrombocythemia: Secondary | ICD-10-CM | POA: Diagnosis not present

## 2017-04-22 DIAGNOSIS — Z7982 Long term (current) use of aspirin: Secondary | ICD-10-CM | POA: Diagnosis not present

## 2017-04-22 DIAGNOSIS — I10 Essential (primary) hypertension: Secondary | ICD-10-CM

## 2017-04-22 LAB — CBC & DIFF AND RETIC
BASO%: 0.4 % (ref 0.0–2.0)
Basophils Absolute: 0 10*3/uL (ref 0.0–0.1)
EOS%: 2.6 % (ref 0.0–7.0)
Eosinophils Absolute: 0.2 10*3/uL (ref 0.0–0.5)
HCT: 36.8 % (ref 34.8–46.6)
HGB: 11.8 g/dL (ref 11.6–15.9)
Immature Retic Fract: 5.7 % (ref 1.60–10.00)
LYMPH%: 28.6 % (ref 14.0–49.7)
MCH: 30.9 pg (ref 25.1–34.0)
MCHC: 32.1 g/dL (ref 31.5–36.0)
MCV: 96.3 fL (ref 79.5–101.0)
MONO#: 0.7 10*3/uL (ref 0.1–0.9)
MONO%: 8.2 % (ref 0.0–14.0)
NEUT#: 4.9 10*3/uL (ref 1.5–6.5)
NEUT%: 60.2 % (ref 38.4–76.8)
Platelets: 288 10*3/uL (ref 145–400)
RBC: 3.82 10*6/uL (ref 3.70–5.45)
RDW: 14.2 % (ref 11.2–14.5)
Retic %: 1.4 % (ref 0.70–2.10)
Retic Ct Abs: 53.48 10*3/uL (ref 33.70–90.70)
WBC: 8.1 10*3/uL (ref 3.9–10.3)
lymph#: 2.3 10*3/uL (ref 0.9–3.3)

## 2017-04-22 LAB — COMPREHENSIVE METABOLIC PANEL
ALT: 18 U/L (ref 0–55)
AST: 26 U/L (ref 5–34)
Albumin: 3.9 g/dL (ref 3.5–5.0)
Alkaline Phosphatase: 76 U/L (ref 40–150)
Anion Gap: 8 mEq/L (ref 3–11)
BUN: 16.1 mg/dL (ref 7.0–26.0)
CO2: 26 mEq/L (ref 22–29)
Calcium: 10 mg/dL (ref 8.4–10.4)
Chloride: 107 mEq/L (ref 98–109)
Creatinine: 0.8 mg/dL (ref 0.6–1.1)
EGFR: >60 mL/min/{1.73_m2} (ref >60)
Glucose: 104 mg/dl (ref 70–140)
Potassium: 4.2 mEq/L (ref 3.5–5.1)
Sodium: 141 mEq/L (ref 136–145)
Total Bilirubin: 0.24 mg/dL (ref 0.20–1.20)
Total Protein: 7.6 g/dL (ref 6.4–8.3)

## 2017-04-22 MED ORDER — ANAGRELIDE HCL 0.5 MG PO CAPS: 0.5000 mg | ORAL_CAPSULE | Freq: Two times a day (BID) | ORAL | 1 refills | Status: DC

## 2017-04-22 NOTE — Telephone Encounter (Signed)
Scheduled appt per 11/7 los - Gave patient AVS and calender per los.  

## 2017-05-17 ENCOUNTER — Other Ambulatory Visit: Payer: Self-pay | Admitting: Hematology

## 2017-05-17 DIAGNOSIS — D473 Essential (hemorrhagic) thrombocythemia: Secondary | ICD-10-CM

## 2017-05-21 ENCOUNTER — Other Ambulatory Visit: Payer: Self-pay | Admitting: *Deleted

## 2017-05-21 DIAGNOSIS — D473 Essential (hemorrhagic) thrombocythemia: Secondary | ICD-10-CM

## 2017-05-21 MED ORDER — ANAGRELIDE HCL 0.5 MG PO CAPS: 0.5000 mg | ORAL_CAPSULE | Freq: Two times a day (BID) | ORAL | 1 refills | Status: DC

## 2017-06-19 DIAGNOSIS — G4700 Insomnia, unspecified: Secondary | ICD-10-CM | POA: Diagnosis not present

## 2017-06-19 DIAGNOSIS — I129 Hypertensive chronic kidney disease with stage 1 through stage 4 chronic kidney disease, or unspecified chronic kidney disease: Secondary | ICD-10-CM | POA: Diagnosis not present

## 2017-06-19 DIAGNOSIS — E7800 Pure hypercholesterolemia, unspecified: Secondary | ICD-10-CM | POA: Diagnosis not present

## 2017-06-19 DIAGNOSIS — N181 Chronic kidney disease, stage 1: Secondary | ICD-10-CM | POA: Diagnosis not present

## 2017-06-19 DIAGNOSIS — D649 Anemia, unspecified: Secondary | ICD-10-CM | POA: Diagnosis not present

## 2017-06-19 DIAGNOSIS — M899 Disorder of bone, unspecified: Secondary | ICD-10-CM | POA: Diagnosis not present

## 2017-06-19 DIAGNOSIS — D473 Essential (hemorrhagic) thrombocythemia: Secondary | ICD-10-CM | POA: Diagnosis not present

## 2017-06-19 DIAGNOSIS — Z0000 Encounter for general adult medical examination without abnormal findings: Secondary | ICD-10-CM | POA: Diagnosis not present

## 2017-06-19 DIAGNOSIS — Z8673 Personal history of transient ischemic attack (TIA), and cerebral infarction without residual deficits: Secondary | ICD-10-CM | POA: Diagnosis not present

## 2017-06-19 DIAGNOSIS — F411 Generalized anxiety disorder: Secondary | ICD-10-CM | POA: Diagnosis not present

## 2017-07-03 DIAGNOSIS — I129 Hypertensive chronic kidney disease with stage 1 through stage 4 chronic kidney disease, or unspecified chronic kidney disease: Secondary | ICD-10-CM | POA: Diagnosis not present

## 2017-07-23 ENCOUNTER — Inpatient Hospital Stay: Payer: PPO | Attending: Hematology

## 2017-07-23 DIAGNOSIS — D473 Essential (hemorrhagic) thrombocythemia: Secondary | ICD-10-CM | POA: Insufficient documentation

## 2017-07-23 DIAGNOSIS — I10 Essential (primary) hypertension: Secondary | ICD-10-CM

## 2017-07-23 LAB — COMPREHENSIVE METABOLIC PANEL
ALT: 16 U/L (ref 0–55)
AST: 24 U/L (ref 5–34)
Albumin: 4.1 g/dL (ref 3.5–5.0)
Alkaline Phosphatase: 85 U/L (ref 40–150)
Anion gap: 8 (ref 3–11)
BUN: 16 mg/dL (ref 7–26)
CO2: 28 mmol/L (ref 22–29)
Calcium: 10.4 mg/dL (ref 8.4–10.4)
Chloride: 102 mmol/L (ref 98–109)
Creatinine, Ser: 0.89 mg/dL (ref 0.60–1.10)
GFR calc Af Amer: >60 mL/min (ref >60)
GFR calc non Af Amer: >60 mL/min (ref >60)
Glucose, Bld: 98 mg/dL (ref 70–140)
Potassium: 5.4 mmol/L — ABNORMAL HIGH (ref 3.5–5.1)
Sodium: 138 mmol/L (ref 136–145)
Total Bilirubin: 0.4 mg/dL (ref 0.2–1.2)
Total Protein: 7.9 g/dL (ref 6.4–8.3)

## 2017-07-23 LAB — CBC WITH DIFFERENTIAL/PLATELET
Basophils Absolute: 0 10*3/uL (ref 0.0–0.1)
Basophils Relative: 1 %
Eosinophils Absolute: 0.3 10*3/uL (ref 0.0–0.5)
Eosinophils Relative: 3 %
HCT: 37.7 % (ref 34.8–46.6)
Hemoglobin: 12.3 g/dL (ref 11.6–15.9)
Lymphocytes Relative: 23 %
Lymphs Abs: 2 10*3/uL (ref 0.9–3.3)
MCH: 31.1 pg (ref 25.1–34.0)
MCHC: 32.6 g/dL (ref 31.5–36.0)
MCV: 95.4 fL (ref 79.5–101.0)
Monocytes Absolute: 0.9 10*3/uL (ref 0.1–0.9)
Monocytes Relative: 10 %
Neutro Abs: 5.6 10*3/uL (ref 1.5–6.5)
Neutrophils Relative %: 63 %
Platelets: 320 10*3/uL (ref 145–400)
RBC: 3.95 MIL/uL (ref 3.70–5.45)
RDW: 14.8 % — ABNORMAL HIGH (ref 11.2–14.5)
WBC: 8.8 10*3/uL (ref 3.9–10.3)

## 2017-07-23 LAB — RETICULOCYTES
RBC.: 3.95 MIL/uL (ref 3.70–5.45)
Retic Count, Absolute: 63.2 10*3/uL (ref 33.7–90.7)
Retic Ct Pct: 1.6 % (ref 0.7–2.1)

## 2017-08-20 DIAGNOSIS — M8588 Other specified disorders of bone density and structure, other site: Secondary | ICD-10-CM | POA: Diagnosis not present

## 2017-10-14 NOTE — Progress Notes (Signed)
[facility]  Telephone:([phone]) [phone] Fax:(336) [phone]  Clinic follow Up Note   Patient Care Team: Mayra Neer, MD as PCP - General (Family Medicine) 10/19/2017  CHIEF COMPLAINTS:  Follow-up essential thrombocythemia    Essential thrombocythemia ([place])   08/13/2014 - 08/14/2014 Hospital Admission    Patient was admitted for acute onset headache and aphasia, spontaneously resolved quickly. Probable TIA versus migraine.      10/17/2014 Miscellaneous    JAK2 V617F mutation (+),       10/24/2014 Initial Diagnosis    Essential thrombocythemia. Pt presented with thrombocytosis with platelet [phone] since 2013.       10/24/2014 Bone Marrow Biopsy    Hypercellular marrow with increased and atypical megakaryocytes. No significant fibrosis. Myeloid and erythroid elements are not overly increased. The overall findings are suggestive of a myeloproliferative neoplasm, especially essential thrombocythemia.       11/06/2014 -  Chemotherapy    Anagrelide 0.5 mg twice daily        HISTORY OF PRESENTING ILLNESS: 10/16/14 Yvonne Rose 72 y.o. female with past medical history of hypertension and anxiety, recent episode of TIA versus complicated migraine, is here because of thrombocytosis.  She had a mechanical fall at home in mid Feb, 2016. She slipped on the front steps of her house in a raining day. She had headaches after worse. She did not seek immediate medical attention. She presents to [facility] with sudden onset aphasia and had right arm weakness, which last for about 10 mins and spontaneously resolved. Her headaches got worse during the episode also. She denies any vision change or other neurological symptoms. She was evaluated in the hospital included CT brain, MRI brain, echocardiogram, carotid Doppler and labs that a wall or unremarkable except for elevated plt count at 616K. she was evaluated by neurology and subsequently discharged home.   She  has had elevated platelete since 2013,  with platelet count 487K. Her WBC and hemoglobin has been normal. Her repeated a CBC in July 2013, August 2016 and March 2016 showed gradually increased platelet count at [place], 543K and a 603K. She has been asymptomatic. She denies any other episodes of thrombosis. She feels well overall, denies any symptoms. She lives with her husband and remains to be physically active at home.  CURRENT THERAPY:  Anagrelide 0.88m twice daily  INTERIM HISTORY:   Yvonne FRANKENreturns for follow-up. She was last sen by me 6 months ago. She presents to the clinic today by herself. She reports she is doing well overall. Her only complaint is that she has noticed some weight gain of 6 lbs and she is trying to be more active. She has notices some leg swelling as well. She is compliant with Anagrelide and reports no complaints.   On review of systems, pt denies any other complaints at this time. Pertinent positives are listed and detailed within the above HPI.   MEDICAL HISTORY:  Past Medical History:  Diagnosis Date  . Anxiety   . Hypertension   . PONV (postoperative nausea and vomiting)     SURGICAL HISTORY: Past Surgical History:  Procedure Laterality Date  . ABDOMINAL HYSTERECTOMY    . BACK SURGERY    . DILATION AND CURETTAGE OF UTERUS      SOCIAL HISTORY: Social History   Socioeconomic History  . Marital status: Married    Spouse name: Not on file  . Number of children: Not on file  . Years of education: Not  on file  . Highest education level: Not on file  Occupational History  . Not on file  Social Needs  . Financial resource strain: Not on file  . Food insecurity:    Worry: Not on file    Inability: Not on file  . Transportation needs:    Medical: Not on file    Non-medical: Not on file  Tobacco Use  . Smoking status: Never Smoker  . Smokeless tobacco: Never Used  Substance and Sexual Activity  . Alcohol use: Yes    Alcohol/week: 1.2 - 1.8  oz    Types: 2 - 3 Cans of beer per week    Comment: daily   . Drug use: No  . Sexual activity: Yes    Birth control/protection: Post-menopausal  Lifestyle  . Physical activity:    Days per week: Not on file    Minutes per session: Not on file  . Stress: Not on file  Relationships  . Social connections:    Talks on phone: Not on file    Gets together: Not on file    Attends religious service: Not on file    Active member of club or organization: Not on file    Attends meetings of clubs or organizations: Not on file    Relationship status: Not on file  . Intimate partner violence:    Fear of current or ex partner: Not on file    Emotionally abused: Not on file    Physically abused: Not on file    Forced sexual activity: Not on file  Other Topics Concern  . Not on file  Social History Narrative  . Not on file    FAMILY HISTORY: Family History  Problem Relation Age of Onset  . Aneurysm Mother   . Hypertension Father   . Alcoholism Father   . Lupus Sister     ALLERGIES:  is allergic to sulfa antibiotics.  MEDICATIONS:  Current Outpatient Medications  Medication Sig Dispense Refill  . acetaminophen (TYLENOL) 500 MG tablet Take 500 mg by mouth every 8 (eight) hours as needed for mild pain.     Marland Kitchen amitriptyline (ELAVIL) 25 MG tablet Take 25 mg by mouth at bedtime.    Marland Kitchen anagrelide (AGRYLIN) 0.5 MG capsule Take 1 capsule (0.5 mg total) by mouth 2 (two) times daily. 180 capsule 1  . aspirin 325 MG tablet Take 1 tablet (325 mg total) by mouth daily. 30 tablet 2  . metoprolol succinate (TOPROL-XL) 100 MG 24 hr tablet Take 1 tablet by mouth daily.    Marland Kitchen omeprazole (PRILOSEC) 20 MG capsule Take 20 mg by mouth daily.    . pravastatin (PRAVACHOL) 80 MG tablet Take 1 tablet (80 mg total) by mouth daily. 30 tablet 2  . lisinopril (PRINIVIL,ZESTRIL) 20 MG tablet Take 20 mg by mouth 2 (two) times daily.   0   No current facility-administered medications for this visit.     REVIEW OF  SYSTEMS:   Constitutional: Denies fevers, chills or abnormal night sweats Eyes: Denies blurriness of vision, double vision or watery eyes Ears, nose, mouth, throat, and face: Denies mucositis or sore throat Respiratory: Denies cough, dyspnea or wheezes Cardiovascular: Denies palpitation, chest discomfort (+) bilateral lower extremity swelling Gastrointestinal:  Denies nausea, heartburn or change in bowel habits Skin: Denies abnormal skin rashes Lymphatics: Denies new lymphadenopathy or easy bruising Neurological:Denies numbness, tingling or new weaknesses Behavioral/Psych: Mood is stable, no new changes  All other systems were reviewed with the patient  and are negative.  PHYSICAL EXAMINATION: ECOG PERFORMANCE STATUS: 0 - Asymptomatic  Vitals:   10/19/17 0828  BP: (!) 148/80  Pulse: 78  Resp: 17  Temp: 98.3 F (36.8 C)  SpO2: 98%   Filed Weights   10/19/17 0828  Weight: 156 lb 8 oz (71 kg)    GENERAL:alert, no distress and comfortable SKIN: skin color, texture, turgor are normal, no rashes or significant lesions EYES: normal, conjunctiva are pink and non-injected, sclera clear OROPHARYNX:no exudate, no erythema and lips, buccal mucosa, and tongue normal  NECK: supple, thyroid normal size, non-tender, without nodularity LYMPH:  no palpable lymphadenopathy in the cervical, axillary or inguinal LUNGS: clear to auscultation and percussion with normal breathing effort HEART: regular rate & rhythm and no murmurs and no lower extremity edema ABDOMEN:abdomen soft, non-tender and normal bowel sounds Musculoskeletal:no cyanosis of digits and no clubbing  PSYCH: alert & oriented x 3 with fluent speech NEURO: no focal motor/sensory deficits  LABORATORY DATA:  I have reviewed the data as listed CBC Latest Ref Rng & Units 10/19/2017 07/23/2017 04/22/2017  WBC 3.9 - 10.3 K/uL 9.2 8.8 8.1  Hemoglobin 11.6 - 15.9 g/dL 12.1 12.3 11.8  Hematocrit 34.8 - 46.6 % 37.9 37.7 36.8  Platelets 145 -  400 K/uL 318 320 288    CMP Latest Ref Rng & Units 07/23/2017 04/22/2017 01/20/2017  Glucose 70 - 140 mg/dL 98 104 97  BUN 7 - 26 mg/dL 16 16.1 16.7  Creatinine 0.60 - 1.10 mg/dL 0.89 0.8 0.8  Sodium 136 - 145 mmol/L 138 141 140  Potassium 3.5 - 5.1 mmol/L 5.4(H) 4.2 5.0  Chloride 98 - 109 mmol/L 102 - -  CO2 22 - 29 mmol/L 28 26 26   Calcium 8.4 - 10.4 mg/dL 10.4 10.0 10.2  Total Protein 6.4 - 8.3 g/dL 7.9 7.6 7.5  Total Bilirubin 0.2 - 1.2 mg/dL 0.4 0.24 0.53  Alkaline Phos 40 - 150 U/L 85 76 74  AST 5 - 34 U/L 24 26 28   ALT 0 - 55 U/L 16 18 22      PATHOLOGY REPORT  Diagnosis Bone Marrow, Aspirate,Biopsy, and Clot, right iliac - HYPERCELLULAR MARROW WITH INCREASED AND ATYPICAL MEGAKARYOCYTES. - SEE COMMENT. PERIPHERAL BLOOD: - THROMBOCYTOSIS. Diagnosis Note The marrow is hypercellular with increased megakaryocytes. Many of the megakaryocytes are atypical with larger forms and hyperlobated nuclei. There is no significant fibrosis. Myeloid and erythroid elements are not overtly increased. The patient is noted to have a persistent thrombocytosis. While reactive causes must be excluded, the overall findings are suggestive of a myeloproliferative neoplasm, specifically essential thrombocythemia. Correlation with molecular testing for JAK-2 and BCR/ABL is recommended.    RADIOGRAPHIC STUDIES: I have personally reviewed the radiological images as listed and agreed with the findings in the report.   ASSESSMENT & PLAN:  72 y.o. Caucasian female, with past medical history of hypertension, anxiety, recent episode of TIA, was negative workup except elevated platelet count.  1. Essential thrombocythemia, JAK2 V617F (+) -I previously reviewed her outside lab results, her platelet count has been slowly trending up since 2013. The rest of her CBC are normal. -She does not appear to have obvious cause for reactive thrombocytosis, such as iron deficient anemia, chronic infection or chronic  inflammation. -I previously reviewed her test results including JAK2 mutation and bone marrow biopsy results. Those tests are consistent with essential thrombocythemia. -The natural history of ET was previously reviewed with patient, it is usually a benign and indolent disease, not curable, major  complications is thrombosis, some patients will develop myelofibrosis at late stage. -treatment options including Hydrea and anagrelide were previously discussed with her, potential benefits and side effects of each agents discussed with her, including small leukogenic risk from Hydrea, and slightly increased vascular risk from anagrelide, etc.  She decided to take anagrelide at that time. -Has been tolerating anagrelide very well, her platelet counts are within normal limits. -Labs reviewed today, her Hgb is 12.1 and her PLT are 318K. Overall her CBC is WNL. Her ET is very well controlled.  -will monitor her cardiovascular risks when she is on anagrelide. I encouraged her to control her HTN and monitor her heart function.  -Continue aspirin 325 mg daily -lab every 3 months. F/u in 6 months   2. Hypertension and anxiety -She will continue follow-up with her primary care physician  3. Osteopenia -She reports her last DEXA revealed Osteopenia. I encouraged her to take a Vitamin D and Ca supplement.   PLAN: -Continue Anagrelide, refilled today  -Lab in 3 months  -Lab and f/u in 6 months   All questions were answered. The patient knows to call the clinic with any problems, questions or concerns.  I spent 15 minutes counseling the patient face to face. The total time spent in the appointment was 20 minutes and more than 50% was on counseling.  This document serves as a record of services personally performed by Truitt Merle, MD. It was created on her behalf by Theresia Bough, a trained medical scribe. The creation of this record is based on the scribe's personal observations and the provider's statements  to them.   I have reviewed the above documentation for accuracy and completeness, and I agree with the above.    Truitt Merle, MD 10/19/2017 10:39 AM

## 2017-10-19 ENCOUNTER — Encounter: Payer: Self-pay | Admitting: Hematology

## 2017-10-19 ENCOUNTER — Inpatient Hospital Stay: Payer: PPO | Attending: Hematology | Admitting: Hematology

## 2017-10-19 ENCOUNTER — Inpatient Hospital Stay (HOSPITAL_BASED_OUTPATIENT_CLINIC_OR_DEPARTMENT_OTHER): Payer: PPO

## 2017-10-19 ENCOUNTER — Telehealth: Payer: Self-pay | Admitting: Hematology

## 2017-10-19 VITALS — BP 148/80 | HR 78 | Temp 98.3°F | Resp 17 | Ht 66.0 in | Wt 156.5 lb

## 2017-10-19 DIAGNOSIS — Z7982 Long term (current) use of aspirin: Secondary | ICD-10-CM | POA: Insufficient documentation

## 2017-10-19 DIAGNOSIS — M8580 Other specified disorders of bone density and structure, unspecified site: Secondary | ICD-10-CM | POA: Diagnosis not present

## 2017-10-19 DIAGNOSIS — F419 Anxiety disorder, unspecified: Secondary | ICD-10-CM | POA: Diagnosis not present

## 2017-10-19 DIAGNOSIS — M7989 Other specified soft tissue disorders: Secondary | ICD-10-CM | POA: Insufficient documentation

## 2017-10-19 DIAGNOSIS — I10 Essential (primary) hypertension: Secondary | ICD-10-CM | POA: Diagnosis not present

## 2017-10-19 DIAGNOSIS — D473 Essential (hemorrhagic) thrombocythemia: Secondary | ICD-10-CM | POA: Diagnosis not present

## 2017-10-19 LAB — CBC WITH DIFFERENTIAL (CANCER CENTER ONLY)
Basophils Absolute: 0.1 10*3/uL (ref 0.0–0.1)
Basophils Relative: 1 %
Eosinophils Absolute: 0.3 10*3/uL (ref 0.0–0.5)
Eosinophils Relative: 3 %
HCT: 37.9 % (ref 34.8–46.6)
Hemoglobin: 12.1 g/dL (ref 11.6–15.9)
Lymphocytes Relative: 23 %
Lymphs Abs: 2.1 10*3/uL (ref 0.9–3.3)
MCH: 30.6 pg (ref 25.1–34.0)
MCHC: 31.9 g/dL (ref 31.5–36.0)
MCV: 95.7 fL (ref 79.5–101.0)
Monocytes Absolute: 0.8 10*3/uL (ref 0.1–0.9)
Monocytes Relative: 8 %
Neutro Abs: 5.9 10*3/uL (ref 1.5–6.5)
Neutrophils Relative %: 65 %
Platelet Count: 318 10*3/uL (ref 145–400)
RBC: 3.96 MIL/uL (ref 3.70–5.45)
RDW: 14.9 % — ABNORMAL HIGH (ref 11.2–14.5)
WBC Count: 9.2 10*3/uL (ref 3.9–10.3)

## 2017-10-19 LAB — RETICULOCYTES
RBC.: 3.96 MIL/uL (ref 3.70–5.45)
Retic Count, Absolute: 55.4 10*3/uL (ref 33.7–90.7)
Retic Ct Pct: 1.4 % (ref 0.7–2.1)

## 2017-10-19 MED ORDER — ANAGRELIDE HCL 0.5 MG PO CAPS: 0.5000 mg | ORAL_CAPSULE | Freq: Two times a day (BID) | ORAL | 1 refills | Status: DC

## 2017-10-19 NOTE — Telephone Encounter (Signed)
Scheduled appt per 5/6 los -Gave patient AVS and calender per los.  

## 2017-12-18 ENCOUNTER — Other Ambulatory Visit: Payer: Self-pay | Admitting: Family Medicine

## 2017-12-18 ENCOUNTER — Ambulatory Visit
Admission: RE | Admit: 2017-12-18 | Discharge: 2017-12-18 | Disposition: A | Discharge status: Home / Self Care | Payer: PPO | Source: Ambulatory Visit | Attending: Family Medicine | Admitting: Family Medicine

## 2017-12-18 DIAGNOSIS — M25572 Pain in left ankle and joints of left foot: Secondary | ICD-10-CM

## 2017-12-18 DIAGNOSIS — E7800 Pure hypercholesterolemia, unspecified: Secondary | ICD-10-CM | POA: Diagnosis not present

## 2017-12-18 DIAGNOSIS — I129 Hypertensive chronic kidney disease with stage 1 through stage 4 chronic kidney disease, or unspecified chronic kidney disease: Secondary | ICD-10-CM | POA: Diagnosis not present

## 2017-12-18 DIAGNOSIS — S9002XA Contusion of left ankle, initial encounter: Secondary | ICD-10-CM | POA: Diagnosis not present

## 2017-12-18 DIAGNOSIS — N181 Chronic kidney disease, stage 1: Secondary | ICD-10-CM | POA: Diagnosis not present

## 2017-12-29 DIAGNOSIS — R7301 Impaired fasting glucose: Secondary | ICD-10-CM | POA: Diagnosis not present

## 2017-12-29 DIAGNOSIS — N179 Acute kidney failure, unspecified: Secondary | ICD-10-CM | POA: Diagnosis not present

## 2018-01-20 ENCOUNTER — Inpatient Hospital Stay: Payer: PPO | Attending: Hematology

## 2018-01-20 DIAGNOSIS — D473 Essential (hemorrhagic) thrombocythemia: Secondary | ICD-10-CM | POA: Insufficient documentation

## 2018-01-20 DIAGNOSIS — I10 Essential (primary) hypertension: Secondary | ICD-10-CM

## 2018-01-20 LAB — CBC WITH DIFFERENTIAL (CANCER CENTER ONLY)
Basophils Absolute: 0 10*3/uL (ref 0.0–0.1)
Basophils Relative: 1 %
Eosinophils Absolute: 0.2 10*3/uL (ref 0.0–0.5)
Eosinophils Relative: 3 %
HCT: 36.2 % (ref 34.8–46.6)
Hemoglobin: 11.6 g/dL (ref 11.6–15.9)
Lymphocytes Relative: 26 %
Lymphs Abs: 2 10*3/uL (ref 0.9–3.3)
MCH: 30.9 pg (ref 25.1–34.0)
MCHC: 32 g/dL (ref 31.5–36.0)
MCV: 96.3 fL (ref 79.5–101.0)
Monocytes Absolute: 0.7 10*3/uL (ref 0.1–0.9)
Monocytes Relative: 9 %
Neutro Abs: 4.8 10*3/uL (ref 1.5–6.5)
Neutrophils Relative %: 61 %
Platelet Count: 307 10*3/uL (ref 145–400)
RBC: 3.76 MIL/uL (ref 3.70–5.45)
RDW: 15.3 % — ABNORMAL HIGH (ref 11.2–14.5)
WBC Count: 7.8 10*3/uL (ref 3.9–10.3)

## 2018-01-20 LAB — RETICULOCYTES
RBC.: 3.76 MIL/uL (ref 3.70–5.45)
Retic Count, Absolute: 56.4 10*3/uL (ref 33.7–90.7)
Retic Ct Pct: 1.5 % (ref 0.7–2.1)

## 2018-01-20 LAB — COMPREHENSIVE METABOLIC PANEL
ALT: 20 U/L (ref 0–44)
AST: 23 U/L (ref 15–41)
Albumin: 4 g/dL (ref 3.5–5.0)
Alkaline Phosphatase: 88 U/L (ref 38–126)
Anion gap: 11 (ref 5–15)
BUN: 18 mg/dL (ref 8–23)
CO2: 23 mmol/L (ref 22–32)
Calcium: 10.1 mg/dL (ref 8.9–10.3)
Chloride: 106 mmol/L (ref 98–111)
Creatinine, Ser: 0.93 mg/dL (ref 0.44–1.00)
GFR calc Af Amer: >60 mL/min (ref >60)
GFR calc non Af Amer: 59 mL/min — ABNORMAL LOW (ref >60)
Glucose, Bld: 104 mg/dL — ABNORMAL HIGH (ref 70–99)
Potassium: 5.6 mmol/L — ABNORMAL HIGH (ref 3.5–5.1)
Sodium: 140 mmol/L (ref 135–145)
Total Bilirubin: 0.3 mg/dL (ref 0.3–1.2)
Total Protein: 7.6 g/dL (ref 6.5–8.1)

## 2018-01-21 ENCOUNTER — Telehealth: Payer: Self-pay

## 2018-01-21 ENCOUNTER — Other Ambulatory Visit: Payer: Self-pay

## 2018-01-21 DIAGNOSIS — D473 Essential (hemorrhagic) thrombocythemia: Secondary | ICD-10-CM

## 2018-01-21 MED ORDER — SODIUM POLYSTYRENE SULFONATE PO POWD: Freq: Once | ORAL | 0 refills | Status: AC

## 2018-01-21 NOTE — Telephone Encounter (Signed)
-----   Message from Truitt Merle, MD sent at 01/21/2018  8:14 AM EDT ----- Please let pt know the lab results, CBC good, her K is slightly high, I do not see any meds can cause this, make sure she is not taking any K containing supplements, and order one dose of Kayexalate (will cause diarrhea) and f/u with her PCP. Thanks   Truitt Merle  01/21/2018

## 2018-01-21 NOTE — Telephone Encounter (Signed)
Left voice message for patient per Dr. Burr Medico recent labs show her potassium level is too high.  Dr. Burr Medico has reviewed her medication list and none of them should case this.  Requested patient to call me back and leave a message if she is currently taking a potassium supplement.  We are sending in a script for Kayexalate to help lower this however it does cause diarrhea.  Dr. Burr Medico would like her to f/u with her PCP

## 2018-02-01 DIAGNOSIS — E875 Hyperkalemia: Secondary | ICD-10-CM | POA: Diagnosis not present

## 2018-04-14 NOTE — Progress Notes (Signed)
[place]  [phone]) [phone] Fax:(336) [phone]  Clinic follow Up Note   Patient Care Team: Mayra Neer, MD as PCP - General (Family Medicine) 04/21/2018  CHIEF COMPLAINTS:  Follow-up essential thrombocythemia    Essential thrombocythemia ([place])   08/13/2014 - 08/14/2014 Hospital Admission    Patient was admitted for acute onset headache and aphasia, spontaneously resolved quickly. Probable TIA versus migraine.    10/17/2014 Miscellaneous    JAK2 V617F mutation (+),     10/24/2014 Initial Diagnosis    Essential thrombocythemia. Pt presented with thrombocytosis with platelet [phone] since 2013.     10/24/2014 Bone Marrow Biopsy    Hypercellular marrow with increased and atypical megakaryocytes. No significant fibrosis. Myeloid and erythroid elements are not overly increased. The overall findings are suggestive of a myeloproliferative neoplasm, especially essential thrombocythemia.     11/06/2014 -  Chemotherapy    Anagrelide 0.5 mg twice daily      HISTORY OF PRESENTING ILLNESS: 10/16/14 Yvonne Rose 72 y.o. female with past medical history of hypertension and anxiety, recent episode of TIA versus complicated migraine, is here because of thrombocytosis.  She had a mechanical fall at home in mid Feb, 2016. She slipped on the front steps of her house in a raining day. She had headaches after worse. She did not seek immediate medical attention. She presents to [facility] with sudden onset aphasia and had right arm weakness, which last for about 10 mins and spontaneously resolved. Her headaches got worse during the episode also. She denies any vision change or other neurological symptoms. She was evaluated in the hospital included CT brain, MRI brain, echocardiogram, carotid Doppler and labs that a wall or unremarkable except for elevated plt count at 616K. she was evaluated by neurology and subsequently discharged home.   She has had elevated  platelete since 2013,  with platelet count 487K. Her WBC and hemoglobin has been normal. Her repeated a CBC in July 2013, August 2016 and March 2016 showed gradually increased platelet count at [facility], 543K and a 603K. She has been asymptomatic. She denies any other episodes of thrombosis. She feels well overall, denies any symptoms. She lives with her husband and remains to be physically active at home.  CURRENT THERAPY:  Anagrelide 0.55m twice daily  INTERIM HISTORY:   Yvonne VASCONEZreturns for follow-up. She was last seen by me 6 months ago. Today, she is here alone. She is doing well and is gaining weight. She states that she is diabetic and need to be careful with her diet. She will also start exercising everyday using a bicycle. She is tolerating treatment well.   MEDICAL HISTORY:  Past Medical History:  Diagnosis Date  . Anxiety   . Hypertension   . PONV (postoperative nausea and vomiting)     SURGICAL HISTORY: Past Surgical History:  Procedure Laterality Date  . ABDOMINAL HYSTERECTOMY    . BACK SURGERY    . DILATION AND CURETTAGE OF UTERUS      SOCIAL HISTORY: Social History   Socioeconomic History  . Marital status: Married    Spouse name: Not on file  . Number of children: Not on file  . Years of education: Not on file  . Highest education level: Not on file  Occupational History  . Not on file  Social Needs  . Financial resource strain: Not on file  . Food insecurity:    Worry: Not on file    Inability: Not on file  .  Transportation needs:    Medical: Not on file    Non-medical: Not on file  Tobacco Use  . Smoking status: Never Smoker  . Smokeless tobacco: Never Used  Substance and Sexual Activity  . Alcohol use: Yes    Alcohol/week: 2.0 - 3.0 standard drinks    Types: 2 - 3 Cans of beer per week    Comment: daily   . Drug use: No  . Sexual activity: Yes    Birth control/protection: Post-menopausal  Lifestyle  . Physical activity:    Days per week:  Not on file    Minutes per session: Not on file  . Stress: Not on file  Relationships  . Social connections:    Talks on phone: Not on file    Gets together: Not on file    Attends religious service: Not on file    Active member of club or organization: Not on file    Attends meetings of clubs or organizations: Not on file    Relationship status: Not on file  . Intimate partner violence:    Fear of current or ex partner: Not on file    Emotionally abused: Not on file    Physically abused: Not on file    Forced sexual activity: Not on file  Other Topics Concern  . Not on file  Social History Narrative  . Not on file    FAMILY HISTORY: Family History  Problem Relation Age of Onset  . Aneurysm Mother   . Hypertension Father   . Alcoholism Father   . Lupus Sister     ALLERGIES:  is allergic to sulfa antibiotics.  MEDICATIONS:  Current Outpatient Medications  Medication Sig Dispense Refill  . acetaminophen (TYLENOL) 500 MG tablet Take 500 mg by mouth every 8 (eight) hours as needed for mild pain.     Marland Kitchen amitriptyline (ELAVIL) 25 MG tablet Take 25 mg by mouth at bedtime.    Marland Kitchen anagrelide (AGRYLIN) 0.5 MG capsule Take 1 capsule (0.5 mg total) by mouth 2 (two) times daily. 180 capsule 3  . aspirin 325 MG tablet Take 1 tablet (325 mg total) by mouth daily. 30 tablet 2  . hydrochlorothiazide (HYDRODIURIL) 25 MG tablet Take 25 mg by mouth daily.    Marland Kitchen lisinopril (PRINIVIL,ZESTRIL) 20 MG tablet Take 20 mg by mouth 2 (two) times daily.   0  . metoprolol succinate (TOPROL-XL) 100 MG 24 hr tablet Take 1 tablet by mouth daily.    Marland Kitchen omeprazole (PRILOSEC) 20 MG capsule Take 20 mg by mouth daily.    . pravastatin (PRAVACHOL) 80 MG tablet Take 1 tablet (80 mg total) by mouth daily. 30 tablet 2   No current facility-administered medications for this visit.     REVIEW OF SYSTEMS:   Constitutional: Denies fevers, chills or abnormal night sweats Eyes: Denies blurriness of vision, double  vision or watery eyes Ears, nose, mouth, throat, and face: Denies mucositis or sore throat Respiratory: Denies cough, dyspnea or wheezes Cardiovascular: Denies palpitation, chest discomfort (+) bilateral lower extremity swelling Gastrointestinal:  Denies nausea, heartburn or change in bowel habits Skin: Denies abnormal skin rashes Lymphatics: Denies new lymphadenopathy or easy bruising Neurological:Denies numbness, tingling or new weaknesses Behavioral/Psych: Mood is stable, no new changes  All other systems were reviewed with the patient and are negative.  PHYSICAL EXAMINATION: ECOG PERFORMANCE STATUS: 0 - Asymptomatic  Vitals:   04/21/18 0846  BP: (!) 146/74  Pulse: 75  Resp: 18  Temp: 98.8 F (37.1  C)  SpO2: 99%   Filed Weights   04/21/18 0846  Weight: 159 lb 4.8 oz (72.3 kg)    GENERAL:alert, no distress and comfortable SKIN: skin color, texture, turgor are normal, no rashes or significant lesions EYES: normal, conjunctiva are pink and non-injected, sclera clear OROPHARYNX:no exudate, no erythema and lips, buccal mucosa, and tongue normal  NECK: supple, thyroid normal size, non-tender, without nodularity LYMPH:  no palpable lymphadenopathy in the cervical, axillary or inguinal LUNGS: clear to auscultation and percussion with normal breathing effort HEART: regular rate & rhythm and no murmurs and no lower extremity edema ABDOMEN:abdomen soft, non-tender and normal bowel sounds Musculoskeletal:no cyanosis of digits and no clubbing  PSYCH: alert & oriented x 3 with fluent speech NEURO: no focal motor/sensory deficits  LABORATORY DATA:  I have reviewed the data as listed CBC Latest Ref Rng & Units 04/21/2018 01/20/2018 10/19/2017  WBC 4.0 - 10.5 K/uL 8.0 7.8 9.2  Hemoglobin 12.0 - 15.0 g/dL 12.0 11.6 12.1  Hematocrit 36.0 - 46.0 % 38.3 36.2 37.9  Platelets 150 - 400 K/uL 287 307 318    CMP Latest Ref Rng & Units 01/20/2018 07/23/2017 04/22/2017  Glucose 70 - 99 mg/dL 104(H)  98 104  BUN 8 - 23 mg/dL 18 16 16.1  Creatinine 0.44 - 1.00 mg/dL 0.93 0.89 0.8  Sodium 135 - 145 mmol/L 140 138 141  Potassium 3.5 - 5.1 mmol/L 5.6(H) 5.4(H) 4.2  Chloride 98 - 111 mmol/L 106 102 -  CO2 22 - 32 mmol/L _0 Calcium 8.9 - 10.3 mg/dL 10.1 10.4 10.0  Total Protein 6.5 - 8.1 g/dL 7.6 7.9 7.6  Total Bilirubin 0.3 - 1.2 mg/dL 0.3 0.4 0.24  Alkaline Phos 38 - 126 U/L 88 85 76  AST 15 - 41 U/L _1 ALT 0 - 44 U/L _2 PATHOLOGY REPORT  Diagnosis Bone Marrow, Aspirate,Biopsy, and Clot, right iliac - HYPERCELLULAR MARROW WITH INCREASED AND ATYPICAL MEGAKARYOCYTES. - SEE COMMENT. PERIPHERAL BLOOD: - THROMBOCYTOSIS. Diagnosis Note The marrow is hypercellular with increased megakaryocytes. Many of the megakaryocytes are atypical with larger forms and hyperlobated nuclei. There is no significant fibrosis. Myeloid and erythroid elements are not overtly increased. The patient is noted to have a persistent thrombocytosis. While reactive causes must be excluded, the overall findings are suggestive of a myeloproliferative neoplasm, specifically essential thrombocythemia. Correlation with molecular testing for JAK-2 and BCR/ABL is recommended.    RADIOGRAPHIC STUDIES: I have personally reviewed the radiological images as listed and agreed with the findings in the report.   ASSESSMENT & PLAN:  72 y.o. Caucasian female, with past medical history of hypertension, anxiety, recent episode of TIA, was negative workup except elevated platelet count.  1. Essential thrombocythemia, JAK2 V617F (+) -I previously reviewed her outside lab results, her platelet count has been slowly trending up since 2013. The rest of her CBC are normal. -She does not appear to have obvious cause for reactive thrombocytosis, such as iron deficient anemia, chronic infection or chronic inflammation. -I previously reviewed her test results including JAK2 mutation and bone marrow biopsy  results. Those tests are consistent with essential thrombocythemia. -The natural history of ET was previously reviewed with patient, it is usually a benign and indolent disease, not curable, major complications is thrombosis, some patients will develop myelofibrosis at late stage. -treatment options including Hydrea and anagrelide were previously discussed with her, potential benefits and side effects of each agents discussed with her,  including small leukogenic risk from Hydrea, and slightly increased vascular risk from anagrelide, etc.  She decided to take anagrelide at that time. -Has been tolerating anagrelide very well, her platelet counts are within normal limits. -Labs reviewed today, CBC is WNLs. CMP pending. Reticulocytes WNLs. -will monitor her cardiovascular risks when she is on anagrelide. I encouraged her to control her HTN and monitor her heart function.  -Continue aspirin 325 mg daily -she will see her PCP Dr. Brigitte Pulse with lab in 2 months. lab in 6 months. F/u in 10 months.    2. Hypertension and anxiety -She will continue follow-up with her primary care physician  3. Osteopenia -She reports her last DEXA revealed Osteopenia. I encouraged her to take a Vitamin D and Ca supplement.   PLAN: -Continue Anagrelide, refilled today  -Lab in 6 months  -f/u in 10 months with lab   All questions were answered. The patient knows to call the clinic with any problems, questions or concerns.  I spent 15 minutes counseling the patient face to face. The total time spent in the appointment was 20 minutes and more than 50% was on counseling.  Dierdre Searles Dweik am acting as scribe for Dr. Truitt Merle.  I have reviewed the above documentation for accuracy and completeness, and I agree with the above.      Truitt Merle, MD 04/21/2018 9:06 AM

## 2018-04-21 ENCOUNTER — Inpatient Hospital Stay: Payer: PPO | Attending: Hematology | Admitting: Hematology

## 2018-04-21 ENCOUNTER — Telehealth: Payer: Self-pay | Admitting: Hematology

## 2018-04-21 ENCOUNTER — Inpatient Hospital Stay: Payer: PPO

## 2018-04-21 ENCOUNTER — Encounter: Payer: Self-pay | Admitting: Hematology

## 2018-04-21 VITALS — BP 146/74 | HR 75 | Temp 98.8°F | Resp 18 | Ht 66.0 in | Wt 159.3 lb

## 2018-04-21 DIAGNOSIS — D473 Essential (hemorrhagic) thrombocythemia: Secondary | ICD-10-CM | POA: Diagnosis not present

## 2018-04-21 DIAGNOSIS — F419 Anxiety disorder, unspecified: Secondary | ICD-10-CM | POA: Diagnosis not present

## 2018-04-21 DIAGNOSIS — Z79899 Other long term (current) drug therapy: Secondary | ICD-10-CM | POA: Insufficient documentation

## 2018-04-21 DIAGNOSIS — M8580 Other specified disorders of bone density and structure, unspecified site: Secondary | ICD-10-CM | POA: Insufficient documentation

## 2018-04-21 DIAGNOSIS — E119 Type 2 diabetes mellitus without complications: Secondary | ICD-10-CM | POA: Insufficient documentation

## 2018-04-21 DIAGNOSIS — I10 Essential (primary) hypertension: Secondary | ICD-10-CM

## 2018-04-21 DIAGNOSIS — Z7982 Long term (current) use of aspirin: Secondary | ICD-10-CM

## 2018-04-21 LAB — COMPREHENSIVE METABOLIC PANEL
ALT: 13 U/L (ref 0–44)
AST: 21 U/L (ref 15–41)
Albumin: 4 g/dL (ref 3.5–5.0)
Alkaline Phosphatase: 83 U/L (ref 38–126)
Anion gap: 9 (ref 5–15)
BUN: 20 mg/dL (ref 8–23)
CO2: 24 mmol/L (ref 22–32)
Calcium: 10.3 mg/dL (ref 8.9–10.3)
Chloride: 106 mmol/L (ref 98–111)
Creatinine, Ser: 1.09 mg/dL — ABNORMAL HIGH (ref 0.44–1.00)
GFR calc Af Amer: 57 mL/min — ABNORMAL LOW (ref >60)
GFR calc non Af Amer: 49 mL/min — ABNORMAL LOW (ref >60)
Glucose, Bld: 97 mg/dL (ref 70–99)
Potassium: 5.3 mmol/L — ABNORMAL HIGH (ref 3.5–5.1)
Sodium: 139 mmol/L (ref 135–145)
Total Bilirubin: <0.2 mg/dL — ABNORMAL LOW (ref 0.3–1.2)
Total Protein: 7.9 g/dL (ref 6.5–8.1)

## 2018-04-21 LAB — CBC WITH DIFFERENTIAL (CANCER CENTER ONLY)
Abs Immature Granulocytes: 0.02 10*3/uL (ref 0.00–0.07)
Basophils Absolute: 0.1 10*3/uL (ref 0.0–0.1)
Basophils Relative: 1 %
Eosinophils Absolute: 0.3 10*3/uL (ref 0.0–0.5)
Eosinophils Relative: 3 %
HCT: 38.3 % (ref 36.0–46.0)
Hemoglobin: 12 g/dL (ref 12.0–15.0)
Immature Granulocytes: 0 %
Lymphocytes Relative: 29 %
Lymphs Abs: 2.3 10*3/uL (ref 0.7–4.0)
MCH: 29.9 pg (ref 26.0–34.0)
MCHC: 31.3 g/dL (ref 30.0–36.0)
MCV: 95.5 fL (ref 80.0–100.0)
Monocytes Absolute: 0.7 10*3/uL (ref 0.1–1.0)
Monocytes Relative: 8 %
Neutro Abs: 4.6 10*3/uL (ref 1.7–7.7)
Neutrophils Relative %: 59 %
Platelet Count: 287 10*3/uL (ref 150–400)
RBC: 4.01 MIL/uL (ref 3.87–5.11)
RDW: 13.8 % (ref 11.5–15.5)
WBC Count: 8 10*3/uL (ref 4.0–10.5)
nRBC: 0 % (ref 0.0–0.2)

## 2018-04-21 LAB — RETICULOCYTES
Immature Retic Fract: 11.7 % (ref 2.3–15.9)
RBC.: 4.01 MIL/uL (ref 3.87–5.11)
Retic Count, Absolute: 54.9 10*3/uL (ref 19.0–186.0)
Retic Ct Pct: 1.4 % (ref 0.4–3.1)

## 2018-04-21 MED ORDER — ANAGRELIDE HCL 0.5 MG PO CAPS: 0.5000 mg | ORAL_CAPSULE | Freq: Two times a day (BID) | ORAL | 3 refills | Status: DC

## 2018-04-21 NOTE — Telephone Encounter (Signed)
Appts scheduled avs/calendar printed per 11/6 los °

## 2018-04-27 ENCOUNTER — Encounter: Payer: Self-pay | Admitting: Hematology

## 2018-06-10 ENCOUNTER — Telehealth: Payer: Self-pay

## 2018-06-10 ENCOUNTER — Encounter: Payer: Self-pay | Admitting: Hematology

## 2018-06-10 NOTE — Telephone Encounter (Signed)
Patient states that her pharmacy is out of anagrelide it will be a couple of weeks before it is in.   She wants to know is it okay to wait until they get it in or does Dr. Burr Medico need to put her on another medication.  [phone]

## 2018-06-10 NOTE — Telephone Encounter (Signed)
Malachy Mood, could you check if [facility] pharmacy has it? Thanks   Truitt Merle MD

## 2018-06-11 ENCOUNTER — Other Ambulatory Visit: Payer: Self-pay

## 2018-06-11 MED ORDER — ANAGRELIDE HCL 1 MG PO CAPS: 1.0000 mg | ORAL_CAPSULE | Freq: Every day | ORAL | 3 refills | Status: DC

## 2018-06-11 NOTE — Progress Notes (Signed)
ana

## 2018-07-09 ENCOUNTER — Other Ambulatory Visit: Payer: Self-pay | Admitting: Family Medicine

## 2018-07-09 DIAGNOSIS — I129 Hypertensive chronic kidney disease with stage 1 through stage 4 chronic kidney disease, or unspecified chronic kidney disease: Secondary | ICD-10-CM | POA: Diagnosis not present

## 2018-07-09 DIAGNOSIS — Z8673 Personal history of transient ischemic attack (TIA), and cerebral infarction without residual deficits: Secondary | ICD-10-CM | POA: Diagnosis not present

## 2018-07-09 DIAGNOSIS — E7800 Pure hypercholesterolemia, unspecified: Secondary | ICD-10-CM | POA: Diagnosis not present

## 2018-07-09 DIAGNOSIS — G4700 Insomnia, unspecified: Secondary | ICD-10-CM | POA: Diagnosis not present

## 2018-07-09 DIAGNOSIS — L989 Disorder of the skin and subcutaneous tissue, unspecified: Secondary | ICD-10-CM | POA: Diagnosis not present

## 2018-07-09 DIAGNOSIS — N181 Chronic kidney disease, stage 1: Secondary | ICD-10-CM | POA: Diagnosis not present

## 2018-07-09 DIAGNOSIS — D473 Essential (hemorrhagic) thrombocythemia: Secondary | ICD-10-CM | POA: Diagnosis not present

## 2018-07-09 DIAGNOSIS — M899 Disorder of bone, unspecified: Secondary | ICD-10-CM | POA: Diagnosis not present

## 2018-07-09 DIAGNOSIS — D649 Anemia, unspecified: Secondary | ICD-10-CM | POA: Diagnosis not present

## 2018-07-09 DIAGNOSIS — Z1231 Encounter for screening mammogram for malignant neoplasm of breast: Secondary | ICD-10-CM

## 2018-07-09 DIAGNOSIS — F411 Generalized anxiety disorder: Secondary | ICD-10-CM | POA: Diagnosis not present

## 2018-07-09 DIAGNOSIS — R7303 Prediabetes: Secondary | ICD-10-CM | POA: Diagnosis not present

## 2018-07-09 DIAGNOSIS — Z0000 Encounter for general adult medical examination without abnormal findings: Secondary | ICD-10-CM | POA: Diagnosis not present

## 2018-08-06 ENCOUNTER — Encounter: Payer: Self-pay | Admitting: Hematology

## 2018-08-06 ENCOUNTER — Other Ambulatory Visit: Payer: Self-pay

## 2018-08-06 DIAGNOSIS — D473 Essential (hemorrhagic) thrombocythemia: Secondary | ICD-10-CM

## 2018-08-06 DIAGNOSIS — M25511 Pain in right shoulder: Secondary | ICD-10-CM | POA: Diagnosis not present

## 2018-08-06 MED ORDER — ANAGRELIDE HCL 1 MG PO CAPS: 1.0000 mg | ORAL_CAPSULE | Freq: Every day | ORAL | 1 refills | Status: DC

## 2018-08-10 DIAGNOSIS — L821 Other seborrheic keratosis: Secondary | ICD-10-CM | POA: Diagnosis not present

## 2018-08-10 DIAGNOSIS — D485 Neoplasm of uncertain behavior of skin: Secondary | ICD-10-CM | POA: Diagnosis not present

## 2018-08-16 DIAGNOSIS — M25511 Pain in right shoulder: Secondary | ICD-10-CM | POA: Diagnosis not present

## 2018-08-24 ENCOUNTER — Ambulatory Visit
Admission: RE | Admit: 2018-08-24 | Discharge: 2018-08-24 | Disposition: A | Discharge status: Home / Self Care | Payer: PPO | Source: Ambulatory Visit | Attending: Family Medicine | Admitting: Family Medicine

## 2018-08-24 DIAGNOSIS — Z1231 Encounter for screening mammogram for malignant neoplasm of breast: Secondary | ICD-10-CM | POA: Diagnosis not present

## 2018-09-29 ENCOUNTER — Other Ambulatory Visit: Payer: Self-pay | Admitting: Hematology

## 2018-09-29 ENCOUNTER — Telehealth: Payer: Self-pay

## 2018-09-29 NOTE — Telephone Encounter (Signed)
Patient calls stating unable to get the Anagrelide (on backorder).  She wants to know if there is a substitute or will it be alright for her to be off this medication without adverse consequences until the medication comes in?  [phone]

## 2018-09-30 ENCOUNTER — Other Ambulatory Visit: Payer: Self-pay | Admitting: Hematology

## 2018-09-30 DIAGNOSIS — D473 Essential (hemorrhagic) thrombocythemia: Secondary | ICD-10-CM

## 2018-09-30 NOTE — Telephone Encounter (Signed)
On National back order.  Please advise substitutions for this request.

## 2018-10-01 ENCOUNTER — Encounter: Payer: Self-pay | Admitting: Hematology

## 2018-10-01 ENCOUNTER — Other Ambulatory Visit: Payer: Self-pay | Admitting: Hematology

## 2018-10-01 ENCOUNTER — Telehealth: Payer: Self-pay

## 2018-10-01 DIAGNOSIS — D473 Essential (hemorrhagic) thrombocythemia: Secondary | ICD-10-CM

## 2018-10-01 NOTE — Telephone Encounter (Signed)
I called pt back and discussed the option of switching anagrelide to Hydrea, due to the national shortage of anagrelide.  However patient is very reluctant to take Hydrea, she has done a lot of online research about Hydrea, and is very afraid of potential side effects, specially risk of skin cancer and leukemia.  She has contacted her local [facility], they may be able to order Agrylin (brand name) for her. Due to her moderate thrombocytosis, I told her it is okay to be out of anagrelide for a few weeks, up to a month, while she is waiting her pharmacy to fill for her. She also wants to try some other alternatives, such as turmeric. I told her that we are also working with our pharmacy to order anagrelide for our pts, will keep her posted.   Due to her current COVID-19 pandemic, I told her OK to postpone her next lab appointment in early May. She will call us if she wants to.   Truitt Merle  10/01/2018

## 2018-10-01 NOTE — Telephone Encounter (Signed)
TC from Pt. Stated she could not get prescription for anagrelide filled because her pharmacy was on back order. She stated that she could get the [employer] from [facility] on [address]. Message sent to Dr. Burr Medico to see if she would send the prescription to [facility].

## 2018-10-04 ENCOUNTER — Telehealth: Payer: Self-pay

## 2018-10-04 NOTE — Telephone Encounter (Signed)
Received call from [facility] wanting permission to change her Anagrelide to 0.05 mg capsule (from 0.1 mg) as their wholesaler has the 0.05 mg.  Called left voice message okay to change.

## 2018-10-20 ENCOUNTER — Other Ambulatory Visit: Payer: Self-pay

## 2018-10-20 ENCOUNTER — Inpatient Hospital Stay: Payer: PPO | Attending: Hematology

## 2018-10-20 DIAGNOSIS — D473 Essential (hemorrhagic) thrombocythemia: Secondary | ICD-10-CM | POA: Insufficient documentation

## 2018-10-20 DIAGNOSIS — I10 Essential (primary) hypertension: Secondary | ICD-10-CM

## 2018-10-20 LAB — CBC WITH DIFFERENTIAL/PLATELET
Abs Immature Granulocytes: 0.03 10*3/uL (ref 0.00–0.07)
Basophils Absolute: 0.1 10*3/uL (ref 0.0–0.1)
Basophils Relative: 1 %
Eosinophils Absolute: 0.2 10*3/uL (ref 0.0–0.5)
Eosinophils Relative: 3 %
HCT: 35.9 % — ABNORMAL LOW (ref 36.0–46.0)
Hemoglobin: 11.3 g/dL — ABNORMAL LOW (ref 12.0–15.0)
Immature Granulocytes: 0 %
Lymphocytes Relative: 24 %
Lymphs Abs: 2 10*3/uL (ref 0.7–4.0)
MCH: 30.4 pg (ref 26.0–34.0)
MCHC: 31.5 g/dL (ref 30.0–36.0)
MCV: 96.5 fL (ref 80.0–100.0)
Monocytes Absolute: 0.7 10*3/uL (ref 0.1–1.0)
Monocytes Relative: 8 %
Neutro Abs: 5.2 10*3/uL (ref 1.7–7.7)
Neutrophils Relative %: 64 %
Platelets: 235 10*3/uL (ref 150–400)
RBC: 3.72 MIL/uL — ABNORMAL LOW (ref 3.87–5.11)
RDW: 15.7 % — ABNORMAL HIGH (ref 11.5–15.5)
WBC: 8.2 10*3/uL (ref 4.0–10.5)
nRBC: 0 % (ref 0.0–0.2)

## 2018-10-20 LAB — COMPREHENSIVE METABOLIC PANEL
ALT: 23 U/L (ref 0–44)
AST: 31 U/L (ref 15–41)
Albumin: 3.8 g/dL (ref 3.5–5.0)
Alkaline Phosphatase: 78 U/L (ref 38–126)
Anion gap: 10 (ref 5–15)
BUN: 13 mg/dL (ref 8–23)
CO2: 23 mmol/L (ref 22–32)
Calcium: 10 mg/dL (ref 8.9–10.3)
Chloride: 107 mmol/L (ref 98–111)
Creatinine, Ser: 0.96 mg/dL (ref 0.44–1.00)
GFR calc Af Amer: >60 mL/min (ref >60)
GFR calc non Af Amer: 58 mL/min — ABNORMAL LOW (ref >60)
Glucose, Bld: 91 mg/dL (ref 70–99)
Potassium: 5.3 mmol/L — ABNORMAL HIGH (ref 3.5–5.1)
Sodium: 140 mmol/L (ref 135–145)
Total Bilirubin: 0.2 mg/dL — ABNORMAL LOW (ref 0.3–1.2)
Total Protein: 7.4 g/dL (ref 6.5–8.1)

## 2018-10-20 LAB — RETICULOCYTES
Immature Retic Fract: 13.9 % (ref 2.3–15.9)
RBC.: 3.72 MIL/uL — ABNORMAL LOW (ref 3.87–5.11)
Retic Count, Absolute: 52.8 10*3/uL (ref 19.0–186.0)
Retic Ct Pct: 1.4 % (ref 0.4–3.1)

## 2018-10-21 ENCOUNTER — Encounter: Payer: Self-pay | Admitting: Hematology

## 2018-11-05 ENCOUNTER — Encounter: Payer: Self-pay | Admitting: Hematology

## 2018-11-05 ENCOUNTER — Other Ambulatory Visit: Payer: Self-pay

## 2018-11-05 DIAGNOSIS — D473 Essential (hemorrhagic) thrombocythemia: Secondary | ICD-10-CM

## 2018-11-05 MED ORDER — ANAGRELIDE HCL 0.5 MG PO CAPS: 0.5000 mg | ORAL_CAPSULE | Freq: Two times a day (BID) | ORAL | 3 refills | Status: DC

## 2019-01-10 DIAGNOSIS — R7303 Prediabetes: Secondary | ICD-10-CM | POA: Diagnosis not present

## 2019-01-10 DIAGNOSIS — N181 Chronic kidney disease, stage 1: Secondary | ICD-10-CM | POA: Diagnosis not present

## 2019-01-10 DIAGNOSIS — E7800 Pure hypercholesterolemia, unspecified: Secondary | ICD-10-CM | POA: Diagnosis not present

## 2019-01-13 DIAGNOSIS — I129 Hypertensive chronic kidney disease with stage 1 through stage 4 chronic kidney disease, or unspecified chronic kidney disease: Secondary | ICD-10-CM | POA: Diagnosis not present

## 2019-01-13 DIAGNOSIS — Z7189 Other specified counseling: Secondary | ICD-10-CM | POA: Diagnosis not present

## 2019-01-13 DIAGNOSIS — R7303 Prediabetes: Secondary | ICD-10-CM | POA: Diagnosis not present

## 2019-01-13 DIAGNOSIS — E7800 Pure hypercholesterolemia, unspecified: Secondary | ICD-10-CM | POA: Diagnosis not present

## 2019-01-13 DIAGNOSIS — N181 Chronic kidney disease, stage 1: Secondary | ICD-10-CM | POA: Diagnosis not present

## 2019-01-28 DIAGNOSIS — M25511 Pain in right shoulder: Secondary | ICD-10-CM | POA: Diagnosis not present

## 2019-01-31 ENCOUNTER — Other Ambulatory Visit: Payer: Self-pay | Admitting: Hematology

## 2019-01-31 DIAGNOSIS — D473 Essential (hemorrhagic) thrombocythemia: Secondary | ICD-10-CM

## 2019-02-01 ENCOUNTER — Encounter: Payer: Self-pay | Admitting: Hematology

## 2019-02-17 NOTE — Progress Notes (Signed)
[place]   Telephone:(336) [phone] Fax:(336) [phone]   Clinic Follow up Note   Patient Care Team: Mayra Neer, MD as PCP - General (Family Medicine)  Date of Service:  02/23/2019  CHIEF COMPLAINT: Follow-up essential thrombocythemia  SUMMARY OF ONCOLOGIC HISTORY: Oncology History  Essential thrombocythemia ([place])  08/13/2014 - 08/14/2014 Hospital Admission   Patient was admitted for acute onset headache and aphasia, spontaneously resolved quickly. Probable TIA versus migraine.   10/17/2014 Miscellaneous   JAK2 V617F mutation (+),    10/24/2014 Initial Diagnosis   Essential thrombocythemia. Pt presented with thrombocytosis with platelet [phone] since 2013.    10/24/2014 Bone Marrow Biopsy   Hypercellular marrow with increased and atypical megakaryocytes. No significant fibrosis. Myeloid and erythroid elements are not overly increased. The overall findings are suggestive of a myeloproliferative neoplasm, especially essential thrombocythemia.    11/06/2014 -  Chemotherapy   Anagrelide 0.5 mg twice daily      CURRENT THERAPY:  Anagrelide 0.70m twice daily since 10/2014  INTERVAL HISTORY:  Yvonne LAATSCHis here for a follow up of ET. She was last seen by me 9 months ago. She presents to the clinic alone. She notes she has had 4-5 nose bleeds in the past month. She told her PCP who changed her apsirin to 3246mand reduced to 8135mShe denies any other sign of bleeding, pain or other concerns.  I reviewed her medication list with her. She takes Elavil to help her sleep. She is no longer on Lisinopril and now on 42m74msartan along with  HCTZ, metoprolol, provostatin and Prilosec. She sees her PCP every 1-2 times a year, next in 06/2019.    REVIEW OF SYSTEMS:   Constitutional: Denies fevers, chills or abnormal weight loss Eyes: Denies blurriness of vision Ears, nose, mouth, throat, and face: Denies mucositis or sore throat (+) Epistaxis  Respiratory: Denies  cough, dyspnea or wheezes Cardiovascular: Denies palpitation, chest discomfort or lower extremity swelling Gastrointestinal:  Denies nausea, heartburn or change in bowel habits Skin: Denies abnormal skin rashes Lymphatics: Denies new lymphadenopathy or easy bruising Neurological:Denies numbness, tingling or new weaknesses Behavioral/Psych: Mood is stable, no new changes  All other systems were reviewed with the patient and are negative.  MEDICAL HISTORY:  Past Medical History:  Diagnosis Date  . Anxiety   . Hypertension   . PONV (postoperative nausea and vomiting)     SURGICAL HISTORY: Past Surgical History:  Procedure Laterality Date  . ABDOMINAL HYSTERECTOMY    . BACK SURGERY    . DILATION AND CURETTAGE OF UTERUS      I have reviewed the social history and family history with the patient and they are unchanged from previous note.  ALLERGIES:  is allergic to sulfa antibiotics.  MEDICATIONS:  Current Outpatient Medications  Medication Sig Dispense Refill  . acetaminophen (TYLENOL) 500 MG tablet Take 500 mg by mouth every 8 (eight) hours as needed for mild pain.     . amMarland Kitchentriptyline (ELAVIL) 25 MG tablet Take 25 mg by mouth at bedtime.    . anMarland Kitchengrelide (AGRYLIN) 1 MG capsule TAKE 1 CAPSULE(1 MG) BY MOUTH DAILY 90 capsule 3  . aspirin 325 MG tablet Take 1 tablet (325 mg total) by mouth daily. 30 tablet 2  . hydrochlorothiazide (HYDRODIURIL) 25 MG tablet Take 25 mg by mouth daily.    . metoprolol succinate (TOPROL-XL) 100 MG 24 hr tablet Take 1 tablet by mouth daily.    . omMarland Kitchenprazole (PRILOSEC) 20 MG capsule Take  20 mg by mouth daily.    . pravastatin (PRAVACHOL) 80 MG tablet Take 1 tablet (80 mg total) by mouth daily. 30 tablet 2  . lisinopril (PRINIVIL,ZESTRIL) 20 MG tablet Take 20 mg by mouth 2 (two) times daily.   0   No current facility-administered medications for this visit.     PHYSICAL EXAMINATION: ECOG PERFORMANCE STATUS: 0 - Asymptomatic  Vitals:   02/23/19 0824   BP: (!) 155/76  Pulse: 80  Resp: 17  Temp: 98.5 F (36.9 C)  SpO2: 97%   Filed Weights   02/23/19 0824  Weight: 157 lb 14.4 oz (71.6 kg)    GENERAL:alert, no distress and comfortable SKIN: skin color, texture, turgor are normal, no rashes or significant lesions EYES: normal, Conjunctiva are pink and non-injected, sclera clear  NECK: supple, thyroid normal size, non-tender, without nodularity LYMPH:  no palpable lymphadenopathy in the cervical, axillary  LUNGS: clear to auscultation and percussion with normal breathing effort HEART: regular rate & rhythm and no murmurs and no lower extremity edema ABDOMEN:abdomen soft, non-tender and normal bowel sounds Musculoskeletal:no cyanosis of digits and no clubbing  NEURO: alert & oriented x 3 with fluent speech, no focal motor/sensory deficits  LABORATORY DATA:  I have reviewed the data as listed CBC Latest Ref Rng & Units 02/23/2019 10/20/2018 04/21/2018  WBC 4.0 - 10.5 K/uL 8.1 8.2 8.0  Hemoglobin 12.0 - 15.0 g/dL 11.8(L) 11.3(L) 12.0  Hematocrit 36.0 - 46.0 % 36.6 35.9(L) 38.3  Platelets 150 - 400 K/uL 323 235 287     CMP Latest Ref Rng & Units 10/20/2018 04/21/2018 01/20/2018  Glucose 70 - 99 mg/dL 91 97 104(H)  BUN 8 - 23 mg/dL _0 Creatinine 0.44 - 1.00 mg/dL 0.96 1.09(H) 0.93  Sodium 135 - 145 mmol/L 140 139 140  Potassium 3.5 - 5.1 mmol/L 5.3(H) 5.3(H) 5.6(H)  Chloride 98 - 111 mmol/L 107 106 106  CO2 22 - 32 mmol/L _1 Calcium 8.9 - 10.3 mg/dL 10.0 10.3 10.1  Total Protein 6.5 - 8.1 g/dL 7.4 7.9 7.6  Total Bilirubin 0.3 - 1.2 mg/dL 0.2(L) <0.2(L) 0.3  Alkaline Phos 38 - 126 U/L 78 83 88  AST 15 - 41 U/L _2 ALT 0 - 44 U/L _3 RADIOGRAPHIC STUDIES: I have personally reviewed the radiological images as listed and agreed with the findings in the report. No results found.   ASSESSMENT & PLAN:  Yvonne Rose is a 73 y.o. female with   1. Essential thrombocythemia, JAK2 V617F (+) -I  previously reviewed her outside lab results, her platelet count has been slowly trending up since 2013. The rest of her CBC are normal. -She does not appear to have obvious cause for reactive thrombocytosis, such as iron deficient anemia, chronic infection or chronic inflammation. -I previously reviewed her test results including JAK2 mutation and bone marrow biopsy results. Those tests are consistent with essential thrombocythemia. -The natural history of ET was previously reviewed with patient, it is usually a benign and indolent disease, not curable, major complications is thrombosis, some patients will develop myelofibrosis at late stage. -I started her on Anagrelide 0.60m BID in 10/2014. Has been tolerating anagrelide very well, her platelet counts are within normal limits.  -She is clinically doing well and stable. CBC WNL except Hg 11.8. PLT normal.Reticulocytes overall WNL. Will continue Anegrelide 120mdaily. Refilled today  -Pt previously declined Hydrea due to the concern  of side effects  -She has had 4-5 nosebleeds in the last month. This persisted even when her PCP reduced her aspirin from 338m to 855mdaily. If this continues to persist, I would recommend she see an ENT. She understands. There is no other sign of bleeding.  -I encouraged her to remain active. Will monitor her cardiovascular risks when she is on anagrelide. I encouraged her to control her HTN and monitor her heart function.  -she will see her PCP Dr. ShBrigitte Pulseith lab in 4 months. F/u with me in 10 months.    2. Hypertension and anxiety -She will continue follow-up with her primary care physician  3. Osteopenia -She reports her last DEXA revealed Osteopenia. I encouraged her to take a Vitamin D and Ca supplement.   PLAN: -She is clinically doing well and stable  -Continue Anagrelide, refilled today -Lab and f/u in 10 months  -Will copy note to Dr. ShBrigitte PulseI recommend her to have CBC check on her next appointment with  Dr. ShBrigitte Pulsen Jan 2021  No problem-specific AsWinfallotes found for this encounter.   No orders of the defined types were placed in this encounter.  All questions were answered. The patient knows to call the clinic with any problems, questions or concerns. No barriers to learning was detected. I spent 10 minutes counseling the patient face to face. The total time spent in the appointment was 15 minutes and more than 50% was on counseling and review of test results     YaTruitt MerleMD 02/23/2019   I, AmJoslyn Devonam acting as scribe for YaTruitt MerleMD.   I have reviewed the above documentation for accuracy and completeness, and I agree with the above.

## 2019-02-23 ENCOUNTER — Telehealth: Payer: Self-pay | Admitting: Hematology

## 2019-02-23 ENCOUNTER — Encounter: Payer: Self-pay | Admitting: Hematology

## 2019-02-23 ENCOUNTER — Other Ambulatory Visit: Payer: Self-pay

## 2019-02-23 ENCOUNTER — Inpatient Hospital Stay: Payer: PPO | Attending: Hematology

## 2019-02-23 ENCOUNTER — Inpatient Hospital Stay (HOSPITAL_BASED_OUTPATIENT_CLINIC_OR_DEPARTMENT_OTHER): Payer: PPO | Admitting: Hematology

## 2019-02-23 VITALS — BP 155/76 | HR 80 | Temp 98.5°F | Resp 17 | Ht 66.0 in | Wt 157.9 lb

## 2019-02-23 DIAGNOSIS — D509 Iron deficiency anemia, unspecified: Secondary | ICD-10-CM | POA: Insufficient documentation

## 2019-02-23 DIAGNOSIS — F419 Anxiety disorder, unspecified: Secondary | ICD-10-CM | POA: Insufficient documentation

## 2019-02-23 DIAGNOSIS — D473 Essential (hemorrhagic) thrombocythemia: Secondary | ICD-10-CM

## 2019-02-23 DIAGNOSIS — M8580 Other specified disorders of bone density and structure, unspecified site: Secondary | ICD-10-CM | POA: Diagnosis not present

## 2019-02-23 DIAGNOSIS — I10 Essential (primary) hypertension: Secondary | ICD-10-CM | POA: Diagnosis not present

## 2019-02-23 DIAGNOSIS — Z7982 Long term (current) use of aspirin: Secondary | ICD-10-CM | POA: Diagnosis not present

## 2019-02-23 DIAGNOSIS — Z79899 Other long term (current) drug therapy: Secondary | ICD-10-CM | POA: Diagnosis not present

## 2019-02-23 DIAGNOSIS — Z90710 Acquired absence of both cervix and uterus: Secondary | ICD-10-CM | POA: Insufficient documentation

## 2019-02-23 DIAGNOSIS — Z9221 Personal history of antineoplastic chemotherapy: Secondary | ICD-10-CM | POA: Insufficient documentation

## 2019-02-23 LAB — CBC WITH DIFFERENTIAL/PLATELET
Abs Immature Granulocytes: 0.04 10*3/uL (ref 0.00–0.07)
Basophils Absolute: 0.1 10*3/uL (ref 0.0–0.1)
Basophils Relative: 1 %
Eosinophils Absolute: 0.2 10*3/uL (ref 0.0–0.5)
Eosinophils Relative: 2 %
HCT: 36.6 % (ref 36.0–46.0)
Hemoglobin: 11.8 g/dL — ABNORMAL LOW (ref 12.0–15.0)
Immature Granulocytes: 1 %
Lymphocytes Relative: 26 %
Lymphs Abs: 2.1 10*3/uL (ref 0.7–4.0)
MCH: 30.7 pg (ref 26.0–34.0)
MCHC: 32.2 g/dL (ref 30.0–36.0)
MCV: 95.3 fL (ref 80.0–100.0)
Monocytes Absolute: 0.6 10*3/uL (ref 0.1–1.0)
Monocytes Relative: 7 %
Neutro Abs: 5.1 10*3/uL (ref 1.7–7.7)
Neutrophils Relative %: 63 %
Platelets: 323 10*3/uL (ref 150–400)
RBC: 3.84 MIL/uL — ABNORMAL LOW (ref 3.87–5.11)
RDW: 14 % (ref 11.5–15.5)
WBC: 8.1 10*3/uL (ref 4.0–10.5)
nRBC: 0 % (ref 0.0–0.2)

## 2019-02-23 LAB — RETICULOCYTES
Immature Retic Fract: 13 % (ref 2.3–15.9)
RBC.: 3.84 MIL/uL — ABNORMAL LOW (ref 3.87–5.11)
Retic Count, Absolute: 56.4 10*3/uL (ref 19.0–186.0)
Retic Ct Pct: 1.5 % (ref 0.4–3.1)

## 2019-02-23 MED ORDER — ANAGRELIDE HCL 1 MG PO CAPS: ORAL_CAPSULE | ORAL | 3 refills | Status: DC

## 2019-02-23 NOTE — Telephone Encounter (Signed)
Scheduled appt per 9/9 los.  A calendar will be mailed out.

## 2019-05-02 DIAGNOSIS — M25511 Pain in right shoulder: Secondary | ICD-10-CM | POA: Diagnosis not present

## 2019-06-30 ENCOUNTER — Ambulatory Visit: Payer: PPO

## 2019-06-30 ENCOUNTER — Ambulatory Visit: Payer: Medicare Other | Attending: Internal Medicine

## 2019-06-30 DIAGNOSIS — Z23 Encounter for immunization: Secondary | ICD-10-CM | POA: Insufficient documentation

## 2019-06-30 NOTE — Progress Notes (Signed)
   Covid-19 Vaccination Clinic  Name:  Yvonne Rose    MRN: [id number] DOB: [birth year]  06/30/2019  Yvonne Rose was observed post Covid-19 immunization for 30 minutes based on pre-vaccination screening without incidence. He was provided with Vaccine Information Sheet and instruction to access the V-Safe system.   Yvonne Rose was instructed to call 911 with any severe reactions post vaccine: Marland Kitchen Difficulty breathing  . Swelling of your face and throat  . A fast heartbeat  . A bad rash all over your body  . Dizziness and weakness    Immunizations Administered    Name Date Dose VIS Date Route   Pfizer COVID-19 Vaccine 06/30/2019  8:21 AM 0.3 mL 05/27/2019 Intramuscular   Manufacturer: [place]   Lot: [id number]   [place]: [id number]

## 2019-07-18 ENCOUNTER — Ambulatory Visit: Payer: PPO | Attending: Internal Medicine

## 2019-07-18 ENCOUNTER — Ambulatory Visit: Payer: PPO

## 2019-07-18 DIAGNOSIS — Z23 Encounter for immunization: Secondary | ICD-10-CM | POA: Insufficient documentation

## 2019-07-18 NOTE — Progress Notes (Signed)
   Covid-19 Vaccination Clinic  Name:  [name]    MRN: [id number] DOB: 03/10/1946  07/18/2019  [name] was observed post Covid-19 immunization for 15 minutes without incidence. He was provided with Vaccine Information Sheet and instruction to access the V-Safe system.   [name] was instructed to call 911 with any severe reactions post vaccine: Marland Kitchen Difficulty breathing  . Swelling of your face and throat  . A fast heartbeat  . A bad rash all over your body  . Dizziness and weakness    Immunizations Administered    Name Date Dose VIS Date Route   Pfizer COVID-19 Vaccine 07/18/2019 10:18 AM 0.3 mL 05/27/2019 Intramuscular   Manufacturer: [place]   Lot: [id number]   [place]: [id number]

## 2019-07-22 DIAGNOSIS — D649 Anemia, unspecified: Secondary | ICD-10-CM | POA: Diagnosis not present

## 2019-07-22 DIAGNOSIS — N181 Chronic kidney disease, stage 1: Secondary | ICD-10-CM | POA: Diagnosis not present

## 2019-07-22 DIAGNOSIS — Z8673 Personal history of transient ischemic attack (TIA), and cerebral infarction without residual deficits: Secondary | ICD-10-CM | POA: Diagnosis not present

## 2019-07-22 DIAGNOSIS — I129 Hypertensive chronic kidney disease with stage 1 through stage 4 chronic kidney disease, or unspecified chronic kidney disease: Secondary | ICD-10-CM | POA: Diagnosis not present

## 2019-07-22 DIAGNOSIS — F411 Generalized anxiety disorder: Secondary | ICD-10-CM | POA: Diagnosis not present

## 2019-07-22 DIAGNOSIS — Z0000 Encounter for general adult medical examination without abnormal findings: Secondary | ICD-10-CM | POA: Diagnosis not present

## 2019-07-22 DIAGNOSIS — R7309 Other abnormal glucose: Secondary | ICD-10-CM | POA: Diagnosis not present

## 2019-07-22 DIAGNOSIS — E7800 Pure hypercholesterolemia, unspecified: Secondary | ICD-10-CM | POA: Diagnosis not present

## 2019-07-22 DIAGNOSIS — M899 Disorder of bone, unspecified: Secondary | ICD-10-CM | POA: Diagnosis not present

## 2019-07-22 DIAGNOSIS — D473 Essential (hemorrhagic) thrombocythemia: Secondary | ICD-10-CM | POA: Diagnosis not present

## 2019-07-22 DIAGNOSIS — G4700 Insomnia, unspecified: Secondary | ICD-10-CM | POA: Diagnosis not present

## 2019-09-20 DIAGNOSIS — M25511 Pain in right shoulder: Secondary | ICD-10-CM | POA: Diagnosis not present

## 2019-10-16 IMAGING — CR DG ANKLE COMPLETE 3+V*L*
3 series · 3 of 3 positions shown · non-contrast
Comparison: 01/09/2010.

CLINICAL DATA: Ankle swelling and bruising.

EXAM:
LEFT ANKLE COMPLETE - 3+ VIEW

[t ankle joint ap left]
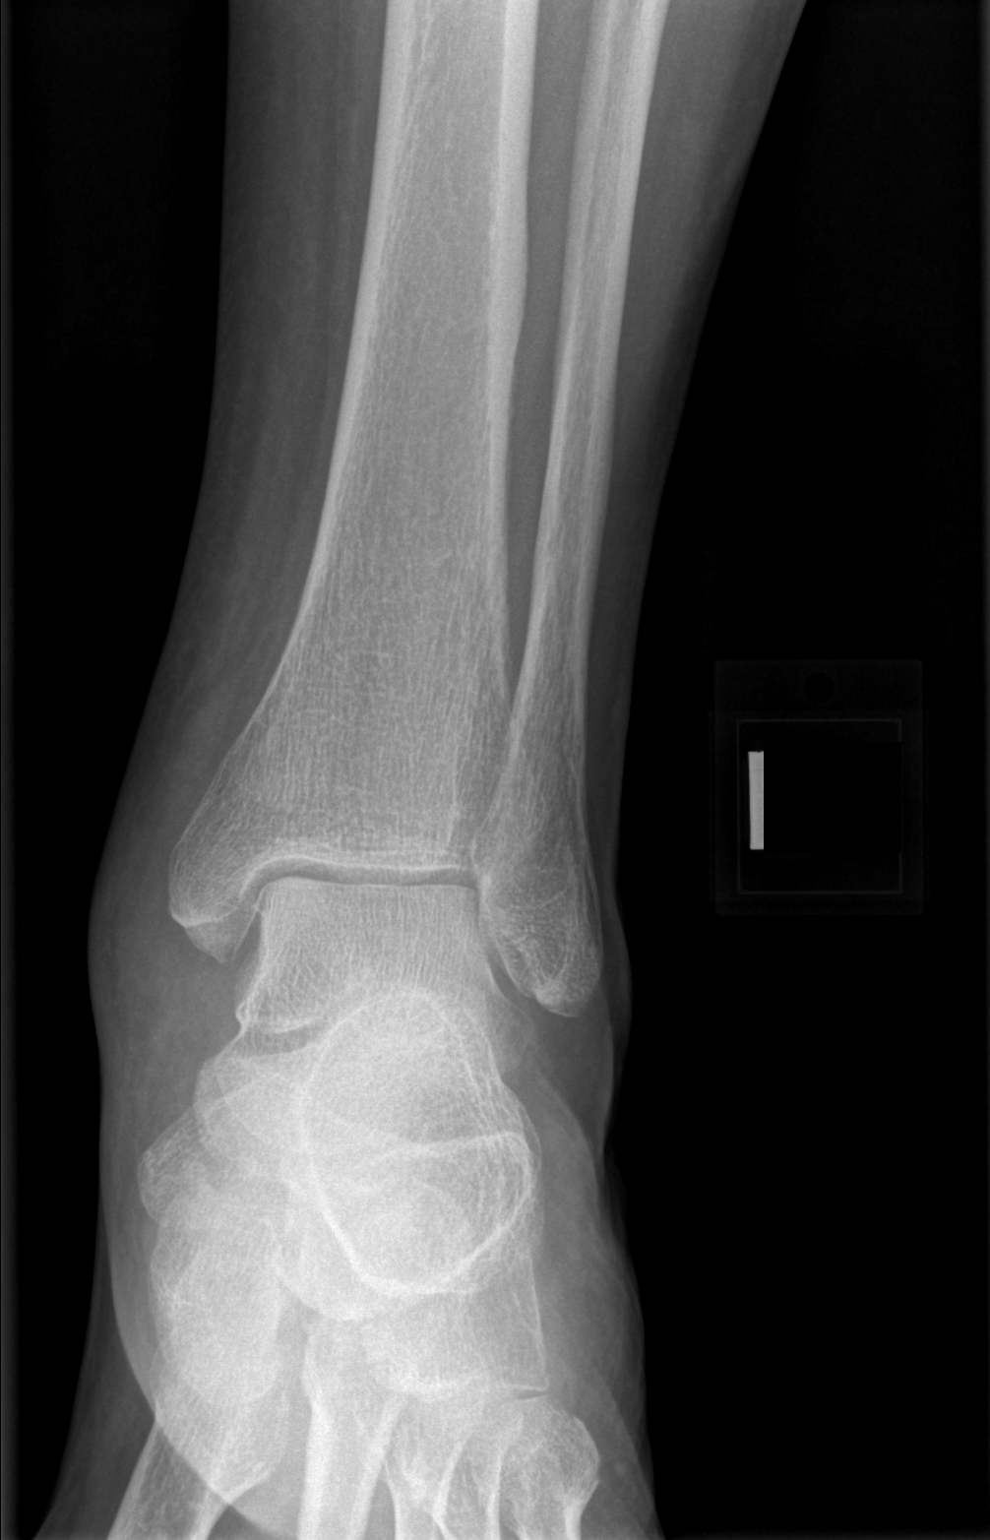

[t ankle joint oblique left]
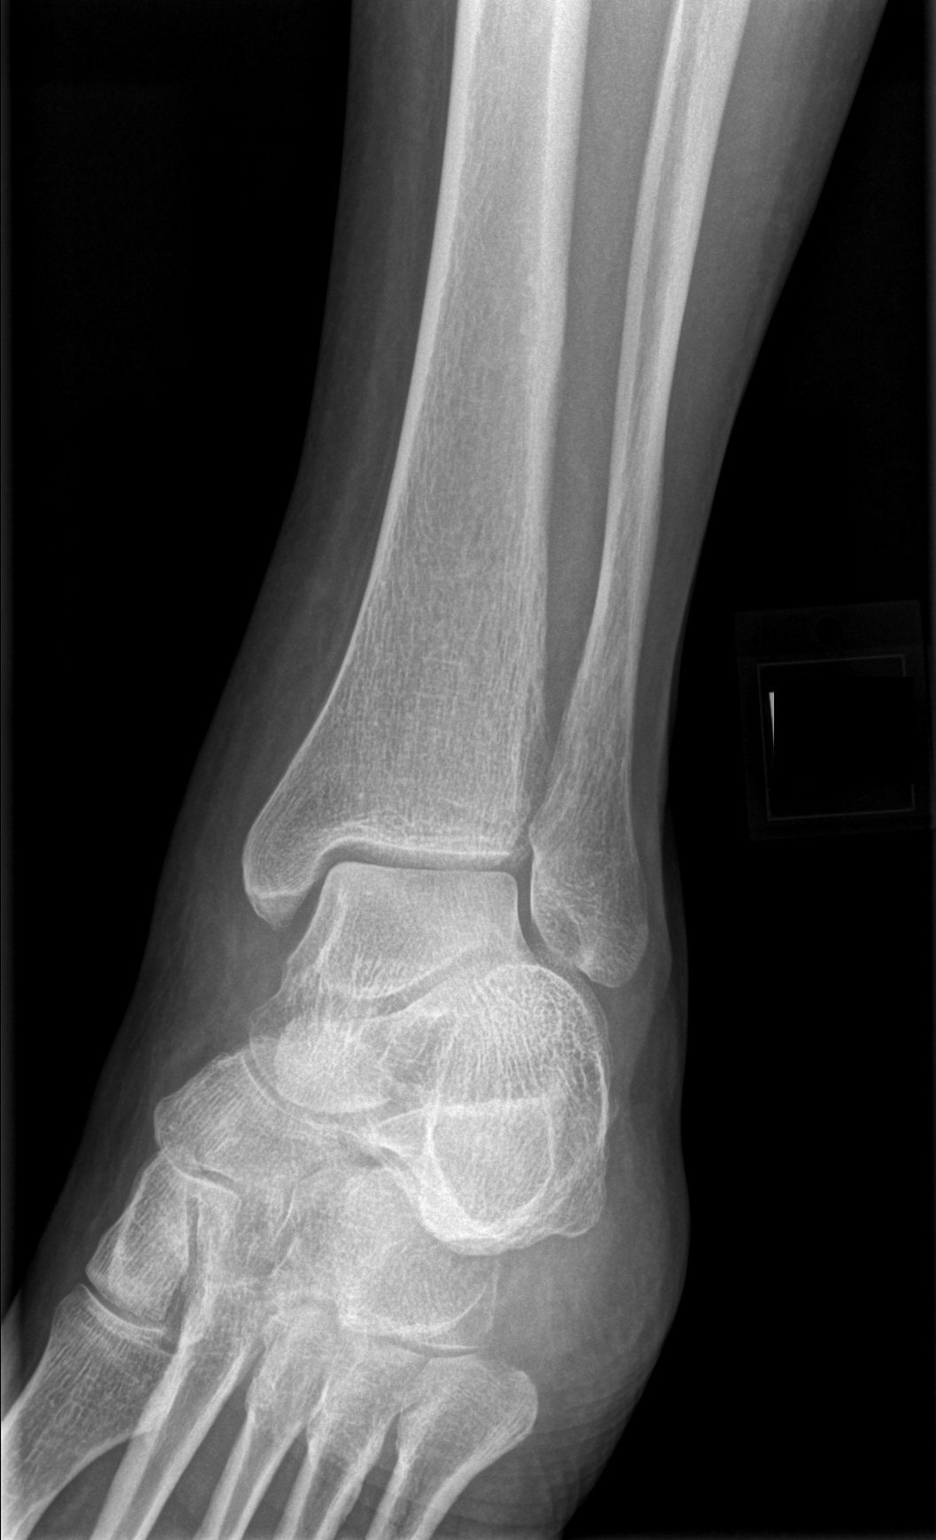

[t ankle joint lat left]
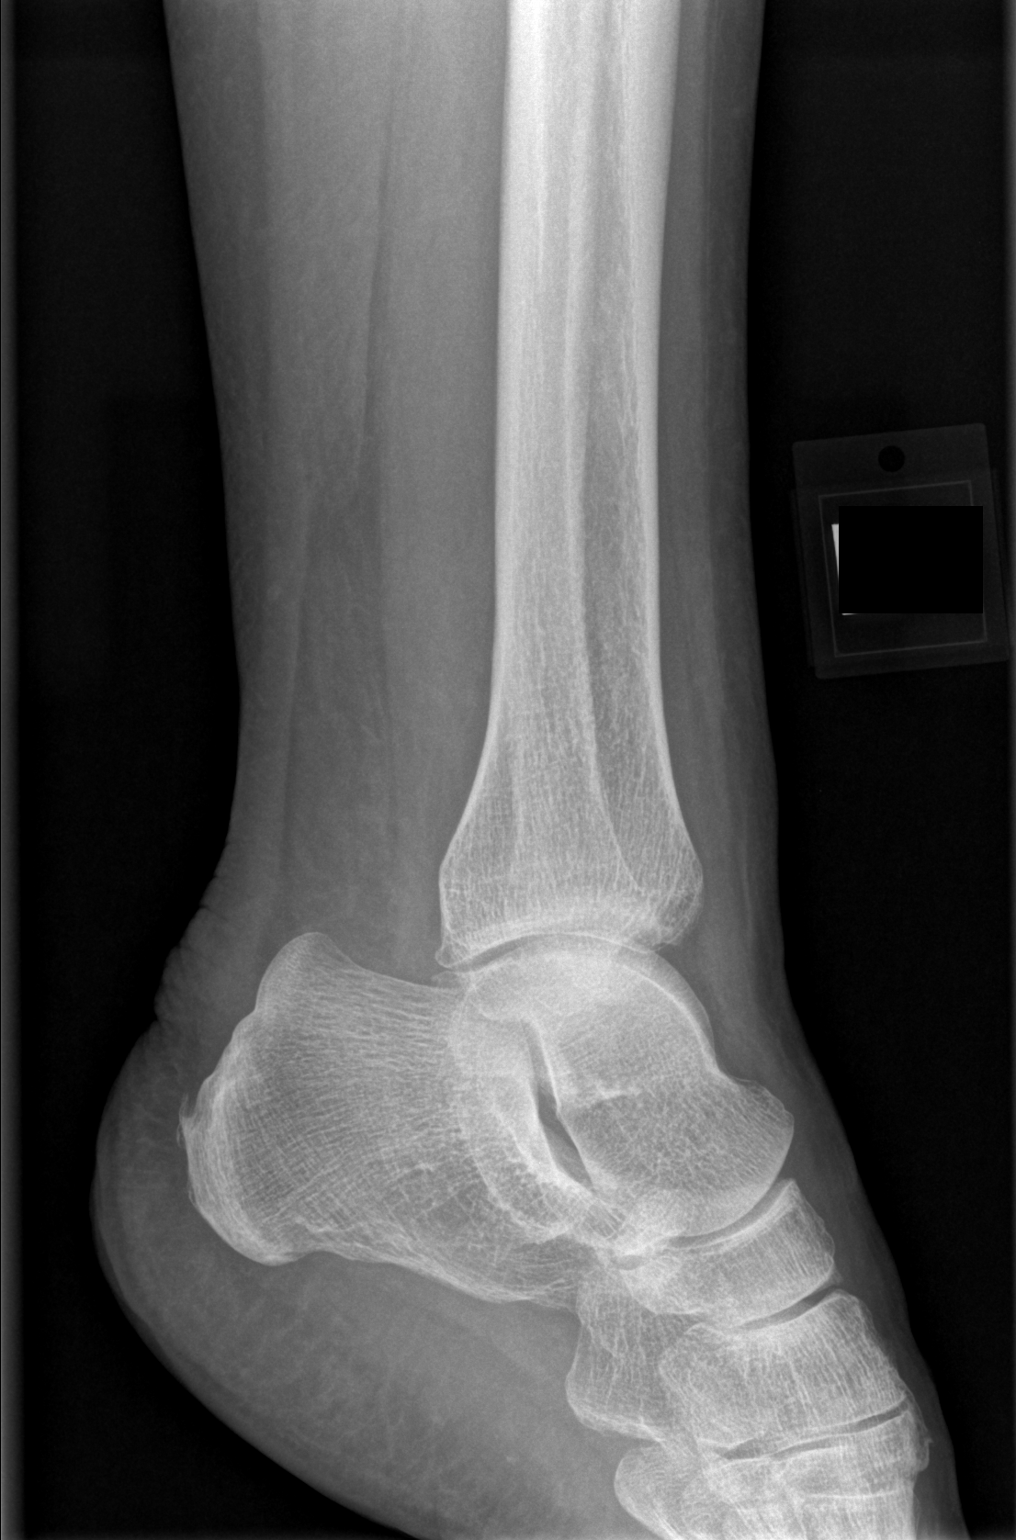

[3 of 3 positions shown; findings below may reference images not displayed]

FINDINGS: Soft tissue swelling is noted particularly of the medial malleolus.
No acute bony or joint abnormality identified. No evidence of
fracture dislocation.
IMPRESSION: Soft tissue swelling noted over the left ankle, particularly of the
medial malleolus. No acute bony abnormality identified.

## 2019-11-15 DIAGNOSIS — H5203 Hypermetropia, bilateral: Secondary | ICD-10-CM | POA: Diagnosis not present

## 2019-11-15 DIAGNOSIS — I10 Essential (primary) hypertension: Secondary | ICD-10-CM | POA: Diagnosis not present

## 2019-11-15 DIAGNOSIS — Z961 Presence of intraocular lens: Secondary | ICD-10-CM | POA: Diagnosis not present

## 2019-12-15 NOTE — Progress Notes (Signed)
Yvonne Rose   Telephone:(336) [phone] Fax:(336) [phone]   Clinic Follow up Note   Patient Care Team: Mayra Neer, MD as PCP - General (Family Medicine)  Date of Service:  12/22/2019  CHIEF COMPLAINT: Follow-up essential thrombocythemia  SUMMARY OF ONCOLOGIC HISTORY: Oncology History  Essential thrombocythemia ([place])  08/13/2014 - 08/14/2014 Hospital Admission   Patient was admitted for acute onset headache and aphasia, spontaneously resolved quickly. Probable TIA versus migraine.   10/17/2014 Miscellaneous   JAK2 V617F mutation (+),    10/24/2014 Initial Diagnosis   Essential thrombocythemia. Pt presented with thrombocytosis with platelet [phone] since 2013.    10/24/2014 Bone Marrow Biopsy   Hypercellular marrow with increased and atypical megakaryocytes. No significant fibrosis. Myeloid and erythroid elements are not overly increased. The overall findings are suggestive of a myeloproliferative neoplasm, especially essential thrombocythemia.    11/06/2014 -  Chemotherapy   Anagrelide 0.5 mg twice daily      CURRENT THERAPY:  Anagrelide 22m daily since 10/2014  INTERVAL HISTORY:  DMARYLAN GLOREis here for a follow up of ET. She was last seen by me 11 months ago. She presents to the clinic alone. She notes she is doing well. She has been helping her daughter since having melatonin and cardiac arrest. She was hospitalized for 72 days.  She denies any new major changes for her. She takes 173mAnagrelide in the morning. She is tolerating well. She denies new medications, chest pain or SOB. She has metoprolol to help reduce palpitations. She plans to f/u with Dr ShBrigitte Pulseext month.    REVIEW OF SYSTEMS:   Constitutional: Denies fevers, chills or abnormal weight loss Eyes: Denies blurriness of vision Ears, nose, mouth, throat, and face: Denies mucositis or sore throat Respiratory: Denies cough, dyspnea or wheezes Cardiovascular: Denies palpitation, chest  discomfort or lower extremity swelling Gastrointestinal:  Denies nausea, heartburn or change in bowel habits Skin: Denies abnormal skin rashes Lymphatics: Denies new lymphadenopathy or easy bruising Neurological:Denies numbness, tingling or new weaknesses Behavioral/Psych: Mood is stable, no new changes  All other systems were reviewed with the patient and are negative.  MEDICAL HISTORY:  Past Medical History:  Diagnosis Date  . Anxiety   . Hypertension   . PONV (postoperative nausea and vomiting)     SURGICAL HISTORY: Past Surgical History:  Procedure Laterality Date  . ABDOMINAL HYSTERECTOMY    . BACK SURGERY    . DILATION AND CURETTAGE OF UTERUS      I have reviewed the social history and family history with the patient and they are unchanged from previous note.  ALLERGIES:  is allergic to propoxyphene and sulfa antibiotics.  MEDICATIONS:  Current Outpatient Medications  Medication Sig Dispense Refill  . acetaminophen (TYLENOL) 500 MG tablet Take 500 mg by mouth every 8 (eight) hours as needed for mild pain.     . Marland Kitchenmitriptyline (ELAVIL) 50 MG tablet Take 50 mg by mouth at bedtime.    . Marland Kitchennagrelide (AGRYLIN) 1 MG capsule TAKE 1 CAPSULE(1 MG) BY MOUTH DAILY 90 capsule 3  . aspirin 81 MG EC tablet Adult Low Dose Aspirin 81 mg tablet,delayed release    . hydrochlorothiazide (HYDRODIURIL) 25 MG tablet Take 25 mg by mouth daily.    . Marland Kitchenosartan (COZAAR) 50 MG tablet losartan 50 mg tablet  TAKE 1 TABLET BY MOUTH EVERY DAY    . metoprolol succinate (TOPROL-XL) 100 MG 24 hr tablet metoprolol succinate ER 100 mg tablet,extended release 24 hr  TK 1 T  PO ONCE A DAY IN THE EVE    . omeprazole (PRILOSEC) 20 MG capsule Take 20 mg by mouth daily.    . pravastatin (PRAVACHOL) 80 MG tablet Take 1 tablet (80 mg total) by mouth daily. 30 tablet 2   No current facility-administered medications for this visit.    PHYSICAL EXAMINATION: ECOG PERFORMANCE STATUS: 0 - Asymptomatic  Vitals:    12/22/19 1039  BP: (!) 172/80  Pulse: 82  Resp: 18  Temp: 97.8 F (36.6 C)  SpO2: 99%   Filed Weights   12/22/19 1039  Weight: 157 lb 14.4 oz (71.6 kg)    GENERAL:alert, no distress and comfortable SKIN: skin color, texture, turgor are normal, no rashes or significant lesions EYES: normal, Conjunctiva are pink and non-injected, sclera clear Musculoskeletal:no cyanosis of digits and no clubbing  NEURO: alert & oriented x 3 with fluent speech, no focal motor/sensory deficits  LABORATORY DATA:  I have reviewed the data as listed CBC Latest Ref Rng & Units 12/22/2019 02/23/2019 10/20/2018  WBC 4.0 - 10.5 K/uL 9.7 8.1 8.2  Hemoglobin 12.0 - 15.0 g/dL 11.4(L) 11.8(L) 11.3(L)  Hematocrit 36 - 46 % 35.5(L) 36.6 35.9(L)  Platelets 150 - 400 K/uL 472(H) 323 235     CMP Latest Ref Rng & Units 12/22/2019 10/20/2018 04/21/2018  Glucose 70 - 99 mg/dL 96 91 97  BUN 8 - 23 mg/dL 21 13 20   Creatinine 0.44 - 1.00 mg/dL 1.19(H) 0.96 1.09(H)  Sodium 135 - 145 mmol/L 142 140 139  Potassium 3.5 - 5.1 mmol/L 4.4 5.3(H) 5.3(H)  Chloride 98 - 111 mmol/L 107 107 106  CO2 22 - 32 mmol/L 25 23 24   Calcium 8.9 - 10.3 mg/dL 10.1 10.0 10.3  Total Protein 6.5 - 8.1 g/dL 7.4 7.4 7.9  Total Bilirubin 0.3 - 1.2 mg/dL 0.3 0.2(L) <0.2(L)  Alkaline Phos 38 - 126 U/L 84 78 83  AST 15 - 41 U/L 22 31 21   ALT 0 - 44 U/L 19 23 13       RADIOGRAPHIC STUDIES: I have personally reviewed the radiological images as listed and agreed with the findings in the report. No results found.   ASSESSMENT & PLAN:  Yvonne Rose is a 74 y.o. adult with    1. Essential thrombocythemia, JAK2 V617F (+) -I previously reviewed her outside lab results, her platelet count has been slowly trending up since 2013. The rest of her CBC are normal. -She does not appear to have obvious cause for reactive thrombocytosis, such as iron deficient anemia, chronic infection or chronic inflammation. -I previously reviewed her test results  including JAK2 mutation and bone marrow biopsy results. Those tests are consistent with essential thrombocythemia. -The natural history of ET was previously reviewed with patient, it is usually a benign and indolent disease, not curable, major complications is thrombosis, some patients will develop myelofibrosis at late stage. -Pt previously declined Hydrea due to the concern of side effects. I started her on Anagrelide 38m in 10/2014. Has been tolerating anagrelide very well and her platelet has been normal.  -Labs reviewed, CBC and CMP WNL except Hg 11.4 and plt increased to 472K. Will continue to monitor closely. Plan to repeat lab in one month to see if anagrelide dose needs to be adjusted  -I will obtain Echo to be done to monitor her heart function on Anagrelide. She is agreeable.  -Labs with her PCP in 01/2020. Lab at our clinic in 4 months. F/u with me in 8 months  2. Hypertension and anxiety -She will continue medications and continue follow-up with her primary care physician  3. Osteopenia -She reports her last DEXA revealed Osteopenia. I encouraged her to take a Vitamin D and Ca supplement.    PLAN: -ECHO in 1-2 weeks for monitoring when she is on Anagrelide  -Continue Anagrelide 83m daily -I encouraged her to contact my clinic if her 01/2020 labs with PCP show elevated platelets. Will copy my note to Dr. SBrigitte Pulse -Lab in 4 months  -Lab and f/u in 8 months    No problem-specific Assessment & Plan notes found for this encounter.   Orders Placed This Encounter  Procedures  . ECHOCARDIOGRAM COMPLETE    Standing Status:   Future    Standing Expiration Date:   12/21/2020    Order Specific Question:   Where should this test be performed    [facility]    Order Specific Question:   Does the patient weigh less than or greater than 250 lbs?    Answer:   Patient weighs less than 250 lbs    Order Specific Question:   Perflutren DEFINITY (image  enhancing agent) should be administered unless hypersensitivity or allergy exist    Answer:   Administer Perflutren    Order Specific Question:   Is a special reader required? (athlete or structural heart)    Answer:   No    Order Specific Question:   Reason for exam-Echo    Answer:   Chemo  V67.2 / Z09   All questions were answered. The patient knows to call the clinic with any problems, questions or concerns. No barriers to learning was detected. The total time spent in the appointment was 25 minutes.     YTruitt Merle MD 12/22/2019   I, AJoslyn Devon am acting as scribe for YTruitt Merle MD.   I have reviewed the above documentation for accuracy and completeness, and I agree with the above.

## 2019-12-22 ENCOUNTER — Inpatient Hospital Stay: Payer: PPO | Attending: Hematology

## 2019-12-22 ENCOUNTER — Other Ambulatory Visit: Payer: Self-pay

## 2019-12-22 ENCOUNTER — Telehealth: Payer: Self-pay | Admitting: Hematology

## 2019-12-22 ENCOUNTER — Inpatient Hospital Stay: Payer: PPO | Admitting: Hematology

## 2019-12-22 ENCOUNTER — Encounter: Payer: Self-pay | Admitting: Hematology

## 2019-12-22 VITALS — BP 172/80 | HR 82 | Temp 97.8°F | Resp 18 | Ht 66.0 in | Wt 157.9 lb

## 2019-12-22 DIAGNOSIS — D473 Essential (hemorrhagic) thrombocythemia: Secondary | ICD-10-CM | POA: Diagnosis not present

## 2019-12-22 DIAGNOSIS — M8580 Other specified disorders of bone density and structure, unspecified site: Secondary | ICD-10-CM | POA: Diagnosis not present

## 2019-12-22 DIAGNOSIS — Z7982 Long term (current) use of aspirin: Secondary | ICD-10-CM | POA: Diagnosis not present

## 2019-12-22 DIAGNOSIS — I10 Essential (primary) hypertension: Secondary | ICD-10-CM | POA: Diagnosis not present

## 2019-12-22 DIAGNOSIS — Z9221 Personal history of antineoplastic chemotherapy: Secondary | ICD-10-CM | POA: Insufficient documentation

## 2019-12-22 DIAGNOSIS — Z79899 Other long term (current) drug therapy: Secondary | ICD-10-CM | POA: Diagnosis not present

## 2019-12-22 DIAGNOSIS — Z90710 Acquired absence of both cervix and uterus: Secondary | ICD-10-CM | POA: Insufficient documentation

## 2019-12-22 DIAGNOSIS — F419 Anxiety disorder, unspecified: Secondary | ICD-10-CM | POA: Insufficient documentation

## 2019-12-22 LAB — COMPREHENSIVE METABOLIC PANEL
ALT: 19 U/L (ref 0–44)
AST: 22 U/L (ref 15–41)
Albumin: 3.9 g/dL (ref 3.5–5.0)
Alkaline Phosphatase: 84 U/L (ref 38–126)
Anion gap: 10 (ref 5–15)
BUN: 21 mg/dL (ref 8–23)
CO2: 25 mmol/L (ref 22–32)
Calcium: 10.1 mg/dL (ref 8.9–10.3)
Chloride: 107 mmol/L (ref 98–111)
Creatinine, Ser: 1.19 mg/dL — ABNORMAL HIGH (ref 0.44–1.00)
GFR calc Af Amer: 51 mL/min — ABNORMAL LOW (ref >60)
GFR calc non Af Amer: 44 mL/min — ABNORMAL LOW (ref >60)
Glucose, Bld: 96 mg/dL (ref 70–99)
Potassium: 4.4 mmol/L (ref 3.5–5.1)
Sodium: 142 mmol/L (ref 135–145)
Total Bilirubin: 0.3 mg/dL (ref 0.3–1.2)
Total Protein: 7.4 g/dL (ref 6.5–8.1)

## 2019-12-22 LAB — CBC WITH DIFFERENTIAL/PLATELET
Abs Immature Granulocytes: 0.05 10*3/uL (ref 0.00–0.07)
Basophils Absolute: 0.1 10*3/uL (ref 0.0–0.1)
Basophils Relative: 1 %
Eosinophils Absolute: 0.3 10*3/uL (ref 0.0–0.5)
Eosinophils Relative: 3 %
HCT: 35.5 % — ABNORMAL LOW (ref 36.0–46.0)
Hemoglobin: 11.4 g/dL — ABNORMAL LOW (ref 12.0–15.0)
Immature Granulocytes: 1 %
Lymphocytes Relative: 21 %
Lymphs Abs: 2 10*3/uL (ref 0.7–4.0)
MCH: 30.6 pg (ref 26.0–34.0)
MCHC: 32.1 g/dL (ref 30.0–36.0)
MCV: 95.4 fL (ref 80.0–100.0)
Monocytes Absolute: 0.8 10*3/uL (ref 0.1–1.0)
Monocytes Relative: 8 %
Neutro Abs: 6.5 10*3/uL (ref 1.7–7.7)
Neutrophils Relative %: 66 %
Platelets: 472 10*3/uL — ABNORMAL HIGH (ref 150–400)
RBC: 3.72 MIL/uL — ABNORMAL LOW (ref 3.87–5.11)
RDW: 15 % (ref 11.5–15.5)
WBC: 9.7 10*3/uL (ref 4.0–10.5)
nRBC: 0 % (ref 0.0–0.2)

## 2019-12-22 LAB — RETICULOCYTES
Immature Retic Fract: 13.3 % (ref 2.3–15.9)
RBC.: 3.72 MIL/uL — ABNORMAL LOW (ref 3.87–5.11)
Retic Count, Absolute: 64.7 10*3/uL (ref 19.0–186.0)
Retic Ct Pct: 1.7 % (ref 0.4–3.1)

## 2019-12-22 MED ORDER — ANAGRELIDE HCL 1 MG PO CAPS: ORAL_CAPSULE | ORAL | 3 refills | Status: DC

## 2019-12-22 NOTE — Telephone Encounter (Signed)
Scheduled per 07/08 loa, patient has received updated calender.

## 2019-12-23 ENCOUNTER — Encounter: Payer: Self-pay | Admitting: Hematology

## 2019-12-26 ENCOUNTER — Other Ambulatory Visit: Payer: Self-pay

## 2019-12-26 DIAGNOSIS — D473 Essential (hemorrhagic) thrombocythemia: Secondary | ICD-10-CM

## 2019-12-26 NOTE — Progress Notes (Signed)
Error

## 2020-01-03 ENCOUNTER — Encounter (HOSPITAL_COMMUNITY): Payer: Self-pay

## 2020-01-03 ENCOUNTER — Emergency Department (HOSPITAL_COMMUNITY): Payer: PPO

## 2020-01-03 ENCOUNTER — Emergency Department (HOSPITAL_COMMUNITY)
Admission: EM | Admit: 2020-01-03 | Discharge: 2020-01-04 | Disposition: A | Discharge status: Home / Self Care | Payer: PPO | Attending: Emergency Medicine | Admitting: Emergency Medicine

## 2020-01-03 DIAGNOSIS — M79602 Pain in left arm: Secondary | ICD-10-CM | POA: Diagnosis not present

## 2020-01-03 DIAGNOSIS — Z5321 Procedure and treatment not carried out due to patient leaving prior to being seen by health care provider: Secondary | ICD-10-CM | POA: Diagnosis not present

## 2020-01-03 DIAGNOSIS — M542 Cervicalgia: Secondary | ICD-10-CM | POA: Diagnosis not present

## 2020-01-03 DIAGNOSIS — R6884 Jaw pain: Secondary | ICD-10-CM | POA: Diagnosis not present

## 2020-01-03 DIAGNOSIS — I10 Essential (primary) hypertension: Secondary | ICD-10-CM | POA: Diagnosis not present

## 2020-01-03 DIAGNOSIS — M25512 Pain in left shoulder: Secondary | ICD-10-CM | POA: Diagnosis not present

## 2020-01-03 HISTORY — DX: Transient cerebral ischemic attack, unspecified: G45.9

## 2020-01-03 LAB — CBC
HCT: 37.7 % (ref 36.0–46.0)
Hemoglobin: 12 g/dL (ref 12.0–15.0)
MCH: 30.5 pg (ref 26.0–34.0)
MCHC: 31.8 g/dL (ref 30.0–36.0)
MCV: 95.7 fL (ref 80.0–100.0)
Platelets: 516 10*3/uL — ABNORMAL HIGH (ref 150–400)
RBC: 3.94 MIL/uL (ref 3.87–5.11)
RDW: 14.9 % (ref 11.5–15.5)
WBC: 12.3 10*3/uL — ABNORMAL HIGH (ref 4.0–10.5)
nRBC: 0 % (ref 0.0–0.2)

## 2020-01-03 LAB — BASIC METABOLIC PANEL
Anion gap: 14 (ref 5–15)
BUN: 22 mg/dL (ref 8–23)
CO2: 20 mmol/L — ABNORMAL LOW (ref 22–32)
Calcium: 9.8 mg/dL (ref 8.9–10.3)
Chloride: 100 mmol/L (ref 98–111)
Creatinine, Ser: 1.11 mg/dL — ABNORMAL HIGH (ref 0.44–1.00)
GFR calc Af Amer: 56 mL/min — ABNORMAL LOW (ref >60)
GFR calc non Af Amer: 48 mL/min — ABNORMAL LOW (ref >60)
Glucose, Bld: 129 mg/dL — ABNORMAL HIGH (ref 70–99)
Potassium: 3.6 mmol/L (ref 3.5–5.1)
Sodium: 134 mmol/L — ABNORMAL LOW (ref 135–145)

## 2020-01-03 LAB — TROPONIN I (HIGH SENSITIVITY): Troponin I (High Sensitivity): 5 ng/L (ref <18)

## 2020-01-03 MED ORDER — SODIUM CHLORIDE 0.9% FLUSH: 3.0000 mL | Freq: Once | INTRAVENOUS | Status: DC

## 2020-01-03 NOTE — ED Triage Notes (Addendum)
Pt arrives to ED w/ c/o hypertension and 10/10 L sided, arm, neck, and jaw pain x 3 days. Pt denies chest pain and sob. Pt states SBP > 200 for the last 3 days, pt has been taking her BP medications. Pt denies injury/trauma.

## 2020-01-04 LAB — TROPONIN I (HIGH SENSITIVITY): Troponin I (High Sensitivity): 5 ng/L (ref <18)

## 2020-01-04 NOTE — ED Notes (Signed)
Patient states she will follow up with primary care and she thanks [place] for our time but she is going to leave

## 2020-01-07 DIAGNOSIS — S46819A Strain of other muscles, fascia and tendons at shoulder and upper arm level, unspecified arm, initial encounter: Secondary | ICD-10-CM | POA: Diagnosis not present

## 2020-01-13 ENCOUNTER — Ambulatory Visit (HOSPITAL_COMMUNITY): Payer: PPO | Attending: Cardiovascular Disease

## 2020-01-13 ENCOUNTER — Other Ambulatory Visit: Payer: Self-pay

## 2020-01-13 DIAGNOSIS — R002 Palpitations: Secondary | ICD-10-CM | POA: Diagnosis not present

## 2020-01-13 DIAGNOSIS — D473 Essential (hemorrhagic) thrombocythemia: Secondary | ICD-10-CM

## 2020-01-13 DIAGNOSIS — I10 Essential (primary) hypertension: Secondary | ICD-10-CM | POA: Insufficient documentation

## 2020-01-13 DIAGNOSIS — M542 Cervicalgia: Secondary | ICD-10-CM | POA: Diagnosis not present

## 2020-01-13 DIAGNOSIS — Z79899 Other long term (current) drug therapy: Secondary | ICD-10-CM | POA: Diagnosis not present

## 2020-01-13 LAB — ECHOCARDIOGRAM COMPLETE
Area-P 1/2: 2.94 cm2
S' Lateral: 2.1 cm

## 2020-01-16 ENCOUNTER — Telehealth: Payer: Self-pay

## 2020-01-16 NOTE — Telephone Encounter (Signed)
I called and spoke with Yvonne Rose and notified her of normal echo results expect for mild impaired relaxation other then that Dr. Burr Medico had no concerns. Yvonne Rose verbalized understanding but wanted to ask Dr. Burr Medico if she was going to change her anagrelide 1 mg to another med or keep her on it. Informed Yvonne Rose that I would speak with Dr. Burr Medico and give her a CB. Yvonne Rose verbalized understanding.

## 2020-01-16 NOTE — Telephone Encounter (Signed)
-----   Message from Truitt Merle, MD sent at 01/15/2020  2:27 PM EDT ----- Please let pt know her echo results. It's most in normal range except mild impaired relaxation, no concerns.   Thanks  Truitt Merle

## 2020-01-16 NOTE — Telephone Encounter (Signed)
Per Tollie Pizza (RN) Dr. Burr Medico does want Yvonne Rose to continue the anagrelide since her Echo was normal.   I called and spoke with Ms. Haynie and notified her to stay on the anagrelide as discussed. Ms. Gladney verbalized understanding.

## 2020-01-19 ENCOUNTER — Other Ambulatory Visit: Payer: Self-pay | Admitting: Family Medicine

## 2020-01-19 DIAGNOSIS — I129 Hypertensive chronic kidney disease with stage 1 through stage 4 chronic kidney disease, or unspecified chronic kidney disease: Secondary | ICD-10-CM | POA: Diagnosis not present

## 2020-01-19 DIAGNOSIS — E7800 Pure hypercholesterolemia, unspecified: Secondary | ICD-10-CM | POA: Diagnosis not present

## 2020-01-19 DIAGNOSIS — N181 Chronic kidney disease, stage 1: Secondary | ICD-10-CM | POA: Diagnosis not present

## 2020-01-19 DIAGNOSIS — D473 Essential (hemorrhagic) thrombocythemia: Secondary | ICD-10-CM | POA: Diagnosis not present

## 2020-01-19 DIAGNOSIS — R7309 Other abnormal glucose: Secondary | ICD-10-CM | POA: Diagnosis not present

## 2020-01-19 DIAGNOSIS — C44519 Basal cell carcinoma of skin of other part of trunk: Secondary | ICD-10-CM | POA: Diagnosis not present

## 2020-01-24 ENCOUNTER — Telehealth: Payer: Self-pay | Admitting: *Deleted

## 2020-01-24 NOTE — Telephone Encounter (Signed)
Called patient regarding elevated platelets 564K,  - states she has been taking her Anagrelide 1 mg daily. Will return for repeat CBC in 3-4 weeks.   Message to scheduler

## 2020-01-25 ENCOUNTER — Telehealth: Payer: Self-pay | Admitting: Hematology

## 2020-01-25 NOTE — Telephone Encounter (Signed)
Scheduled per 8/10 sch message. Pt is aware of appt time and date

## 2020-02-14 ENCOUNTER — Other Ambulatory Visit: Payer: Self-pay

## 2020-02-14 ENCOUNTER — Inpatient Hospital Stay: Payer: PPO | Attending: Hematology

## 2020-02-14 DIAGNOSIS — D473 Essential (hemorrhagic) thrombocythemia: Secondary | ICD-10-CM

## 2020-02-14 LAB — CBC WITH DIFFERENTIAL (CANCER CENTER ONLY)
Abs Immature Granulocytes: 0.07 10*3/uL (ref 0.00–0.07)
Basophils Absolute: 0.1 10*3/uL (ref 0.0–0.1)
Basophils Relative: 1 %
Eosinophils Absolute: 0.3 10*3/uL (ref 0.0–0.5)
Eosinophils Relative: 3 %
HCT: 36.9 % (ref 36.0–46.0)
Hemoglobin: 11.9 g/dL — ABNORMAL LOW (ref 12.0–15.0)
Immature Granulocytes: 1 %
Lymphocytes Relative: 21 %
Lymphs Abs: 2 10*3/uL (ref 0.7–4.0)
MCH: 30.2 pg (ref 26.0–34.0)
MCHC: 32.2 g/dL (ref 30.0–36.0)
MCV: 93.7 fL (ref 80.0–100.0)
Monocytes Absolute: 0.9 10*3/uL (ref 0.1–1.0)
Monocytes Relative: 9 %
Neutro Abs: 6.4 10*3/uL (ref 1.7–7.7)
Neutrophils Relative %: 65 %
Platelet Count: 470 10*3/uL — ABNORMAL HIGH (ref 150–400)
RBC: 3.94 MIL/uL (ref 3.87–5.11)
RDW: 15.5 % (ref 11.5–15.5)
WBC Count: 9.7 10*3/uL (ref 4.0–10.5)
nRBC: 0 % (ref 0.0–0.2)

## 2020-02-14 LAB — RETICULOCYTES
Immature Retic Fract: 14.7 % (ref 2.3–15.9)
RBC.: 3.94 MIL/uL (ref 3.87–5.11)
Retic Count, Absolute: 69.3 10*3/uL (ref 19.0–186.0)
Retic Ct Pct: 1.8 % (ref 0.4–3.1)

## 2020-02-17 ENCOUNTER — Telehealth: Payer: Self-pay

## 2020-02-17 NOTE — Telephone Encounter (Signed)
I spoke with Yvonne Rose and reviewed Dr. Ernestina Penna comments and recommendations.  She verbalized understanding.

## 2020-02-17 NOTE — Telephone Encounter (Signed)
-----   Message from Truitt Merle, MD sent at 02/14/2020 10:55 PM EDT ----- Let pt know her CBC result, plt slightly high, OK to continue her current anagrelide dose, thanks   Truitt Merle

## 2020-02-17 NOTE — Telephone Encounter (Signed)
I left vm for Ms Villatoro to return my call.

## 2020-02-21 ENCOUNTER — Encounter: Payer: Self-pay | Admitting: Hematology

## 2020-04-26 ENCOUNTER — Inpatient Hospital Stay: Payer: PPO | Attending: Hematology

## 2020-04-26 ENCOUNTER — Other Ambulatory Visit: Payer: Self-pay

## 2020-04-26 DIAGNOSIS — M84374A Stress fracture, right foot, initial encounter for fracture: Secondary | ICD-10-CM | POA: Diagnosis not present

## 2020-04-26 DIAGNOSIS — D473 Essential (hemorrhagic) thrombocythemia: Secondary | ICD-10-CM | POA: Diagnosis not present

## 2020-04-26 LAB — CBC WITH DIFFERENTIAL (CANCER CENTER ONLY)
Abs Immature Granulocytes: 0.08 10*3/uL — ABNORMAL HIGH (ref 0.00–0.07)
Basophils Absolute: 0.1 10*3/uL (ref 0.0–0.1)
Basophils Relative: 1 %
Eosinophils Absolute: 0.3 10*3/uL (ref 0.0–0.5)
Eosinophils Relative: 2 %
HCT: 36.4 % (ref 36.0–46.0)
Hemoglobin: 11.6 g/dL — ABNORMAL LOW (ref 12.0–15.0)
Immature Granulocytes: 1 %
Lymphocytes Relative: 21 %
Lymphs Abs: 2.2 10*3/uL (ref 0.7–4.0)
MCH: 30.4 pg (ref 26.0–34.0)
MCHC: 31.9 g/dL (ref 30.0–36.0)
MCV: 95.5 fL (ref 80.0–100.0)
Monocytes Absolute: 0.8 10*3/uL (ref 0.1–1.0)
Monocytes Relative: 8 %
Neutro Abs: 6.8 10*3/uL (ref 1.7–7.7)
Neutrophils Relative %: 67 %
Platelet Count: 518 10*3/uL — ABNORMAL HIGH (ref 150–400)
RBC: 3.81 MIL/uL — ABNORMAL LOW (ref 3.87–5.11)
RDW: 14.8 % (ref 11.5–15.5)
WBC Count: 10.2 10*3/uL (ref 4.0–10.5)
nRBC: 0 % (ref 0.0–0.2)

## 2020-04-26 LAB — RETICULOCYTES
Immature Retic Fract: 17.9 % — ABNORMAL HIGH (ref 2.3–15.9)
RBC.: 3.76 MIL/uL — ABNORMAL LOW (ref 3.87–5.11)
Retic Count, Absolute: 72.2 10*3/uL (ref 19.0–186.0)
Retic Ct Pct: 1.9 % (ref 0.4–3.1)

## 2020-04-27 ENCOUNTER — Encounter: Payer: Self-pay | Admitting: Hematology

## 2020-04-29 ENCOUNTER — Other Ambulatory Visit: Payer: Self-pay | Admitting: Hematology

## 2020-04-29 MED ORDER — ANAGRELIDE HCL 0.5 MG PO CAPS
0.5000 mg | ORAL_CAPSULE | Freq: Every day | ORAL | 3 refills | Status: DC
Start: 2020-04-29 — End: 2020-08-23

## 2020-04-30 ENCOUNTER — Telehealth: Payer: Self-pay | Admitting: Nurse Practitioner

## 2020-04-30 NOTE — Telephone Encounter (Signed)
Called pt per 11/15 sch msg - scheduled lab appt for 1 month and 2 months . No answer and vmail full on pt end . Mailed letter with appts.

## 2020-05-30 ENCOUNTER — Inpatient Hospital Stay: Payer: PPO | Attending: Hematology

## 2020-05-30 ENCOUNTER — Other Ambulatory Visit: Payer: Self-pay

## 2020-05-30 DIAGNOSIS — D473 Essential (hemorrhagic) thrombocythemia: Secondary | ICD-10-CM | POA: Diagnosis not present

## 2020-05-30 LAB — CBC WITH DIFFERENTIAL/PLATELET
Abs Immature Granulocytes: 0.04 10*3/uL (ref 0.00–0.07)
Basophils Absolute: 0.1 10*3/uL (ref 0.0–0.1)
Basophils Relative: 1 %
Eosinophils Absolute: 0.3 10*3/uL (ref 0.0–0.5)
Eosinophils Relative: 3 %
HCT: 36.6 % (ref 36.0–46.0)
Hemoglobin: 11.6 g/dL — ABNORMAL LOW (ref 12.0–15.0)
Immature Granulocytes: 0 %
Lymphocytes Relative: 20 %
Lymphs Abs: 1.9 10*3/uL (ref 0.7–4.0)
MCH: 30.5 pg (ref 26.0–34.0)
MCHC: 31.7 g/dL (ref 30.0–36.0)
MCV: 96.3 fL (ref 80.0–100.0)
Monocytes Absolute: 0.8 10*3/uL (ref 0.1–1.0)
Monocytes Relative: 9 %
Neutro Abs: 6.7 10*3/uL (ref 1.7–7.7)
Neutrophils Relative %: 67 %
Platelets: 442 10*3/uL — ABNORMAL HIGH (ref 150–400)
RBC: 3.8 MIL/uL — ABNORMAL LOW (ref 3.87–5.11)
RDW: 14.6 % (ref 11.5–15.5)
WBC: 9.9 10*3/uL (ref 4.0–10.5)
nRBC: 0 % (ref 0.0–0.2)

## 2020-05-30 LAB — RETICULOCYTES
Immature Retic Fract: 13.3 % (ref 2.3–15.9)
RBC.: 3.72 MIL/uL — ABNORMAL LOW (ref 3.87–5.11)
Retic Count, Absolute: 54.7 10*3/uL (ref 19.0–186.0)
Retic Ct Pct: 1.5 % (ref 0.4–3.1)

## 2020-06-01 ENCOUNTER — Encounter: Payer: Self-pay | Admitting: Hematology

## 2020-06-04 ENCOUNTER — Encounter: Payer: Self-pay | Admitting: Hematology

## 2020-06-05 ENCOUNTER — Other Ambulatory Visit: Payer: Self-pay

## 2020-06-05 ENCOUNTER — Encounter: Payer: Self-pay | Admitting: Hematology

## 2020-06-05 DIAGNOSIS — D473 Essential (hemorrhagic) thrombocythemia: Secondary | ICD-10-CM

## 2020-06-05 MED ORDER — ANAGRELIDE HCL 1 MG PO CAPS: ORAL_CAPSULE | ORAL | 3 refills | Status: DC

## 2020-06-29 ENCOUNTER — Inpatient Hospital Stay: Payer: PPO | Attending: Hematology

## 2020-06-29 ENCOUNTER — Other Ambulatory Visit: Payer: Self-pay

## 2020-06-29 DIAGNOSIS — D473 Essential (hemorrhagic) thrombocythemia: Secondary | ICD-10-CM | POA: Diagnosis not present

## 2020-06-29 LAB — CBC WITH DIFFERENTIAL/PLATELET
Abs Immature Granulocytes: 0.06 10*3/uL (ref 0.00–0.07)
Basophils Absolute: 0.1 10*3/uL (ref 0.0–0.1)
Basophils Relative: 1 %
Eosinophils Absolute: 0.4 10*3/uL (ref 0.0–0.5)
Eosinophils Relative: 4 %
HCT: 38 % (ref 36.0–46.0)
Hemoglobin: 12 g/dL (ref 12.0–15.0)
Immature Granulocytes: 1 %
Lymphocytes Relative: 20 %
Lymphs Abs: 2.1 10*3/uL (ref 0.7–4.0)
MCH: 30.2 pg (ref 26.0–34.0)
MCHC: 31.6 g/dL (ref 30.0–36.0)
MCV: 95.5 fL (ref 80.0–100.0)
Monocytes Absolute: 0.9 10*3/uL (ref 0.1–1.0)
Monocytes Relative: 9 %
Neutro Abs: 6.8 10*3/uL (ref 1.7–7.7)
Neutrophils Relative %: 65 %
Platelets: 457 10*3/uL — ABNORMAL HIGH (ref 150–400)
RBC: 3.98 MIL/uL (ref 3.87–5.11)
RDW: 14.6 % (ref 11.5–15.5)
WBC: 10.2 10*3/uL (ref 4.0–10.5)
nRBC: 0 % (ref 0.0–0.2)

## 2020-06-29 LAB — RETICULOCYTES
Immature Retic Fract: 17.2 % — ABNORMAL HIGH (ref 2.3–15.9)
RBC.: 3.98 MIL/uL (ref 3.87–5.11)
Retic Count, Absolute: 71.6 10*3/uL (ref 19.0–186.0)
Retic Ct Pct: 1.8 % (ref 0.4–3.1)

## 2020-06-30 ENCOUNTER — Encounter: Payer: Self-pay | Admitting: Hematology

## 2020-07-02 ENCOUNTER — Telehealth: Payer: Self-pay | Admitting: *Deleted

## 2020-07-02 NOTE — Telephone Encounter (Signed)
-----   Message from Truitt Merle, MD sent at 07/01/2020 12:33 PM EST ----- Let pt know her plt is good, continue current dose anagrelide, thanks   Truitt Merle  07/01/2020

## 2020-07-02 NOTE — Telephone Encounter (Signed)
Notified of message below

## 2020-07-30 ENCOUNTER — Other Ambulatory Visit: Payer: Self-pay | Admitting: Family Medicine

## 2020-07-30 DIAGNOSIS — R7303 Prediabetes: Secondary | ICD-10-CM | POA: Diagnosis not present

## 2020-07-30 DIAGNOSIS — M899 Disorder of bone, unspecified: Secondary | ICD-10-CM | POA: Diagnosis not present

## 2020-07-30 DIAGNOSIS — F411 Generalized anxiety disorder: Secondary | ICD-10-CM | POA: Diagnosis not present

## 2020-07-30 DIAGNOSIS — G4700 Insomnia, unspecified: Secondary | ICD-10-CM | POA: Diagnosis not present

## 2020-07-30 DIAGNOSIS — C4491 Basal cell carcinoma of skin, unspecified: Secondary | ICD-10-CM | POA: Diagnosis not present

## 2020-07-30 DIAGNOSIS — E7800 Pure hypercholesterolemia, unspecified: Secondary | ICD-10-CM | POA: Diagnosis not present

## 2020-07-30 DIAGNOSIS — M8580 Other specified disorders of bone density and structure, unspecified site: Secondary | ICD-10-CM

## 2020-07-30 DIAGNOSIS — N181 Chronic kidney disease, stage 1: Secondary | ICD-10-CM | POA: Diagnosis not present

## 2020-07-30 DIAGNOSIS — D473 Essential (hemorrhagic) thrombocythemia: Secondary | ICD-10-CM | POA: Diagnosis not present

## 2020-07-30 DIAGNOSIS — D649 Anemia, unspecified: Secondary | ICD-10-CM | POA: Diagnosis not present

## 2020-07-30 DIAGNOSIS — Z1231 Encounter for screening mammogram for malignant neoplasm of breast: Secondary | ICD-10-CM

## 2020-07-30 DIAGNOSIS — I129 Hypertensive chronic kidney disease with stage 1 through stage 4 chronic kidney disease, or unspecified chronic kidney disease: Secondary | ICD-10-CM | POA: Diagnosis not present

## 2020-07-30 DIAGNOSIS — Z0000 Encounter for general adult medical examination without abnormal findings: Secondary | ICD-10-CM | POA: Diagnosis not present

## 2020-08-06 DIAGNOSIS — D485 Neoplasm of uncertain behavior of skin: Secondary | ICD-10-CM | POA: Diagnosis not present

## 2020-08-06 DIAGNOSIS — C44519 Basal cell carcinoma of skin of other part of trunk: Secondary | ICD-10-CM | POA: Diagnosis not present

## 2020-08-06 DIAGNOSIS — C44619 Basal cell carcinoma of skin of left upper limb, including shoulder: Secondary | ICD-10-CM | POA: Diagnosis not present

## 2020-08-13 DIAGNOSIS — E871 Hypo-osmolality and hyponatremia: Secondary | ICD-10-CM | POA: Diagnosis not present

## 2020-08-13 DIAGNOSIS — I129 Hypertensive chronic kidney disease with stage 1 through stage 4 chronic kidney disease, or unspecified chronic kidney disease: Secondary | ICD-10-CM | POA: Diagnosis not present

## 2020-08-15 DIAGNOSIS — C44519 Basal cell carcinoma of skin of other part of trunk: Secondary | ICD-10-CM | POA: Diagnosis not present

## 2020-08-15 DIAGNOSIS — C44619 Basal cell carcinoma of skin of left upper limb, including shoulder: Secondary | ICD-10-CM | POA: Diagnosis not present

## 2020-08-20 NOTE — Progress Notes (Signed)
Yvonne Rose   Telephone:(336) [phone] Fax:(336) [phone]   Clinic Follow up Note   Patient Care Team: Mayra Neer, MD as PCP - General (Family Medicine)  Date of Service:  08/23/2020  CHIEF COMPLAINT: Follow-up essential thrombocythemia  SUMMARY OF ONCOLOGIC HISTORY: Oncology History  Essential thrombocythemia ([place])  08/13/2014 - 08/14/2014 Hospital Admission   Patient was admitted for acute onset headache and aphasia, spontaneously resolved quickly. Probable TIA versus migraine.   10/17/2014 Miscellaneous   JAK2 V617F mutation (+),    10/24/2014 Initial Diagnosis   Essential thrombocythemia. Pt presented with thrombocytosis with platelet [phone] since 2013.    10/24/2014 Bone Marrow Biopsy   Hypercellular marrow with increased and atypical megakaryocytes. No significant fibrosis. Myeloid and erythroid elements are not overly increased. The overall findings are suggestive of a myeloproliferative neoplasm, especially essential thrombocythemia.    11/06/2014 -  Chemotherapy   Anagrelide 0.5 mg twice daily      CURRENT THERAPY:  Anagrelide 35m dailysince 10/2014, added anagrelide to 0.5 mg on Monday Wednesday Friday in 2021 due to persistent thrombocytosis  which was changed to 0.560mpm daily on 08/23/2020  INTERVAL HISTORY:  DoBABBIE Rose here for a follow up of ET. She was last seen by me 8 months ago. She presents to the clinic alone. She is doing well overall  Tolerated anagrelide well  No blood clots or other medical event lately   All other systems were reviewed with the patient and are negative.  MEDICAL HISTORY:  Past Medical History:  Diagnosis Date  . Anxiety   . Hypertension   . PONV (postoperative nausea and vomiting)   . TIA (transient ischemic attack)     SURGICAL HISTORY: Past Surgical History:  Procedure Laterality Date  . ABDOMINAL HYSTERECTOMY    . BACK SURGERY    . DILATION AND CURETTAGE OF UTERUS      I have  reviewed the social history and family history with the patient and they are unchanged from previous note.  ALLERGIES:  is allergic to propoxyphene and sulfa antibiotics.  MEDICATIONS:  Current Outpatient Medications  Medication Sig Dispense Refill  . diazepam (VALIUM) 2 MG tablet Take 2 mg by mouth at bedtime.    . Marland Kitchenosartan (COZAAR) 100 MG tablet Take 100 mg by mouth daily.    . Marland Kitchencetaminophen (TYLENOL) 500 MG tablet Take 500 mg by mouth every 8 (eight) hours as needed for mild pain.     . Marland Kitchennagrelide (AGRYLIN) 0.5 MG capsule Take 1 capsule (0.5 mg total) by mouth daily. Please take 1 capsule after dinner on Mondays, Wednesday and Fridays, in addition to 92m79maily in morning. 15 capsule 3  . anagrelide (AGRYLIN) 1 MG capsule TAKE 1 CAPSULE(1 MG) BY MOUTH DAILY 90 capsule 3  . aspirin 81 MG EC tablet Adult Low Dose Aspirin 81 mg tablet,delayed release    . hydrochlorothiazide (HYDRODIURIL) 25 MG tablet Take 25 mg by mouth daily.    . metoprolol succinate (TOPROL-XL) 100 MG 24 hr tablet metoprolol succinate ER 100 mg tablet,extended release 24 hr  TK 1 T PO ONCE A DAY IN THE EVE    . omeprazole (PRILOSEC) 20 MG capsule Take 20 mg by mouth daily.    . pravastatin (PRAVACHOL) 80 MG tablet Take 1 tablet (80 mg total) by mouth daily. 30 tablet 2   No current facility-administered medications for this visit.    PHYSICAL EXAMINATION: ECOG PERFORMANCE STATUS: 1 - Symptomatic but completely ambulatory  Vitals:  08/23/20 0842  BP: 137/69  Pulse: 85  Resp: 18  Temp: (!) 96.5 F (35.8 C)  SpO2: 98%   Filed Weights   08/23/20 0842  Weight: 163 lb 9.6 oz (74.2 kg)    GENERAL:alert, no distress and comfortable SKIN: skin color, texture, turgor are normal, no rashes or significant lesions EYES: normal, Conjunctiva are pink and non-injected, sclera clear Musculoskeletal:no cyanosis of digits and no clubbing, no leg edema   NEURO: alert & oriented x 3 with fluent speech, no focal  motor/sensory deficits  LABORATORY DATA:  I have reviewed the data as listed CBC Latest Ref Rng & Units 08/23/2020 06/29/2020 05/30/2020  WBC 4.0 - 10.5 K/uL 10.4 10.2 9.9  Hemoglobin 12.0 - 15.0 g/dL 12.4 12.0 11.6(L)  Hematocrit 36.0 - 46.0 % 37.3 38.0 36.6  Platelets 150 - 400 K/uL 480(H) 457(H) 442(H)     CMP Latest Ref Rng & Units 01/03/2020 12/22/2019 10/20/2018  Glucose 70 - 99 mg/dL 129(H) 96 91  BUN 8 - 23 mg/dL 22 21 13   Creatinine 0.44 - 1.00 mg/dL 1.11(H) 1.19(H) 0.96  Sodium 135 - 145 mmol/L 134(L) 142 140  Potassium 3.5 - 5.1 mmol/L 3.6 4.4 5.3(H)  Chloride 98 - 111 mmol/L 100 107 107  CO2 22 - 32 mmol/L 20(L) 25 23  Calcium 8.9 - 10.3 mg/dL 9.8 10.1 10.0  Total Protein 6.5 - 8.1 g/dL - 7.4 7.4  Total Bilirubin 0.3 - 1.2 mg/dL - 0.3 0.2(L)  Alkaline Phos 38 - 126 U/L - 84 78  AST 15 - 41 U/L - 22 31  ALT 0 - 44 U/L - 19 23      RADIOGRAPHIC STUDIES: I have personally reviewed the radiological images as listed and agreed with the findings in the report. No results found.   ASSESSMENT & PLAN:  Yvonne Rose is a 75 y.o. adult with   1. Essential thrombocythemia, JAK2 V617F (+) -Her platelet count has been slowly trending up since 2013. The rest of her CBC are normal. -She does not appear to have obvious cause for reactive thrombocytosis, such as iron deficient anemia, chronic infection or chronic inflammation. -Her JAK2 mutation and bone marrow biopsy results are consistent with essential thrombocythemia. -We previously discussed the natural history of ET is usually a benign and indolent disease, not curable, major complications is thrombosis, some patients will develop myelofibrosis at late stage. -Pt previously declined Hydrea due to the concern of side effects. I started her on Anagrelide 48m in 10/2014.Has been tolerating anagrelide very well  -Echocardiogram was unremarkable in July 2021.  Patient is aware of potential cardiac toxicity from anagrelide -due to  her increased platelet count, weight increased anagrelide dose in late 2021 -Lab reviewed, platelet 480K today, creatinine 1.22.  I recommend increase anagrelide to 1 mg in the morning, 0.5 mg in evening, every day   2. Hypertension and anxiety -She will continue medications and continue follow-up with her primary care physician  3. Osteopenia -She reports her last DEXA revealed Osteopenia. I encouraged her to take a Vitamin D and Ca supplement.    PLAN: -lab reviewed  -will increase Anagrelide to 113mam and 0.86m67mm daily -Lab in 1 months  -Lab and f/u in 3 months    No problem-specific Assessment & Plan notes found for this encounter.   No orders of the defined types were placed in this encounter.  All questions were answered. The patient knows to call the clinic with any problems, questions or  concerns. No barriers to learning was detected. The total time spent in the appointment was 30 minutes.     Truitt Merle, MD 08/23/2020   I, Joslyn Devon, am acting as scribe for Truitt Merle, MD.   I have reviewed the above documentation for accuracy and completeness, and I agree with the above.

## 2020-08-22 ENCOUNTER — Other Ambulatory Visit: Payer: Self-pay | Admitting: Hematology

## 2020-08-22 DIAGNOSIS — D473 Essential (hemorrhagic) thrombocythemia: Secondary | ICD-10-CM

## 2020-08-23 ENCOUNTER — Inpatient Hospital Stay: Payer: PPO | Attending: Hematology

## 2020-08-23 ENCOUNTER — Encounter: Payer: Self-pay | Admitting: Hematology

## 2020-08-23 ENCOUNTER — Other Ambulatory Visit: Payer: Self-pay

## 2020-08-23 ENCOUNTER — Inpatient Hospital Stay: Payer: PPO | Admitting: Hematology

## 2020-08-23 ENCOUNTER — Telehealth: Payer: Self-pay | Admitting: Hematology

## 2020-08-23 VITALS — BP 137/69 | HR 85 | Temp 96.5°F | Resp 18 | Ht 66.0 in | Wt 163.6 lb

## 2020-08-23 DIAGNOSIS — Z8673 Personal history of transient ischemic attack (TIA), and cerebral infarction without residual deficits: Secondary | ICD-10-CM | POA: Insufficient documentation

## 2020-08-23 DIAGNOSIS — F418 Other specified anxiety disorders: Secondary | ICD-10-CM | POA: Diagnosis not present

## 2020-08-23 DIAGNOSIS — Z79899 Other long term (current) drug therapy: Secondary | ICD-10-CM | POA: Insufficient documentation

## 2020-08-23 DIAGNOSIS — D473 Essential (hemorrhagic) thrombocythemia: Secondary | ICD-10-CM | POA: Diagnosis not present

## 2020-08-23 DIAGNOSIS — I10 Essential (primary) hypertension: Secondary | ICD-10-CM | POA: Diagnosis not present

## 2020-08-23 DIAGNOSIS — Z7982 Long term (current) use of aspirin: Secondary | ICD-10-CM | POA: Insufficient documentation

## 2020-08-23 DIAGNOSIS — M8580 Other specified disorders of bone density and structure, unspecified site: Secondary | ICD-10-CM | POA: Insufficient documentation

## 2020-08-23 LAB — CMP (CANCER CENTER ONLY)
ALT: 15 U/L (ref 0–44)
AST: 20 U/L (ref 15–41)
Albumin: 4.2 g/dL (ref 3.5–5.0)
Alkaline Phosphatase: 100 U/L (ref 38–126)
Anion gap: 9 (ref 5–15)
BUN: 21 mg/dL (ref 8–23)
CO2: 21 mmol/L — ABNORMAL LOW (ref 22–32)
Calcium: 9.8 mg/dL (ref 8.9–10.3)
Chloride: 105 mmol/L (ref 98–111)
Creatinine: 1.22 mg/dL — ABNORMAL HIGH (ref 0.44–1.00)
GFR, Estimated: 46 mL/min — ABNORMAL LOW (ref >60)
Glucose, Bld: 94 mg/dL (ref 70–99)
Potassium: 4.2 mmol/L (ref 3.5–5.1)
Sodium: 135 mmol/L (ref 135–145)
Total Bilirubin: 0.3 mg/dL (ref 0.3–1.2)
Total Protein: 8 g/dL (ref 6.5–8.1)

## 2020-08-23 LAB — CBC WITH DIFFERENTIAL (CANCER CENTER ONLY)
Abs Immature Granulocytes: 0.08 10*3/uL — ABNORMAL HIGH (ref 0.00–0.07)
Basophils Absolute: 0.1 10*3/uL (ref 0.0–0.1)
Basophils Relative: 1 %
Eosinophils Absolute: 0.3 10*3/uL (ref 0.0–0.5)
Eosinophils Relative: 3 %
HCT: 37.3 % (ref 36.0–46.0)
Hemoglobin: 12.4 g/dL (ref 12.0–15.0)
Immature Granulocytes: 1 %
Lymphocytes Relative: 20 %
Lymphs Abs: 2 10*3/uL (ref 0.7–4.0)
MCH: 30.7 pg (ref 26.0–34.0)
MCHC: 33.2 g/dL (ref 30.0–36.0)
MCV: 92.3 fL (ref 80.0–100.0)
Monocytes Absolute: 0.8 10*3/uL (ref 0.1–1.0)
Monocytes Relative: 8 %
Neutro Abs: 7.1 10*3/uL (ref 1.7–7.7)
Neutrophils Relative %: 67 %
Platelet Count: 480 10*3/uL — ABNORMAL HIGH (ref 150–400)
RBC: 4.04 MIL/uL (ref 3.87–5.11)
RDW: 14.6 % (ref 11.5–15.5)
WBC Count: 10.4 10*3/uL (ref 4.0–10.5)
nRBC: 0 % (ref 0.0–0.2)

## 2020-08-23 MED ORDER — ANAGRELIDE HCL 0.5 MG PO CAPS: 0.5000 mg | ORAL_CAPSULE | Freq: Every day | ORAL | 1 refills | Status: DC

## 2020-08-23 NOTE — Telephone Encounter (Signed)
Scheduled per los. Gave avs and calendar  

## 2020-08-24 ENCOUNTER — Encounter: Payer: Self-pay | Admitting: Hematology

## 2020-08-29 DIAGNOSIS — C44619 Basal cell carcinoma of skin of left upper limb, including shoulder: Secondary | ICD-10-CM | POA: Diagnosis not present

## 2020-09-24 ENCOUNTER — Other Ambulatory Visit: Payer: Self-pay

## 2020-09-24 ENCOUNTER — Inpatient Hospital Stay: Payer: PPO | Attending: Hematology

## 2020-09-24 DIAGNOSIS — D473 Essential (hemorrhagic) thrombocythemia: Secondary | ICD-10-CM | POA: Diagnosis not present

## 2020-09-24 LAB — CBC WITH DIFFERENTIAL (CANCER CENTER ONLY)
Abs Immature Granulocytes: 0.08 10*3/uL — ABNORMAL HIGH (ref 0.00–0.07)
Basophils Absolute: 0.1 10*3/uL (ref 0.0–0.1)
Basophils Relative: 1 %
Eosinophils Absolute: 0.4 10*3/uL (ref 0.0–0.5)
Eosinophils Relative: 4 %
HCT: 36.5 % (ref 36.0–46.0)
Hemoglobin: 11.7 g/dL — ABNORMAL LOW (ref 12.0–15.0)
Immature Granulocytes: 1 %
Lymphocytes Relative: 21 %
Lymphs Abs: 2.4 10*3/uL (ref 0.7–4.0)
MCH: 30.5 pg (ref 26.0–34.0)
MCHC: 32.1 g/dL (ref 30.0–36.0)
MCV: 95.3 fL (ref 80.0–100.0)
Monocytes Absolute: 0.9 10*3/uL (ref 0.1–1.0)
Monocytes Relative: 8 %
Neutro Abs: 7.7 10*3/uL (ref 1.7–7.7)
Neutrophils Relative %: 65 %
Platelet Count: 332 10*3/uL (ref 150–400)
RBC: 3.83 MIL/uL — ABNORMAL LOW (ref 3.87–5.11)
RDW: 14.8 % (ref 11.5–15.5)
WBC Count: 11.5 10*3/uL — ABNORMAL HIGH (ref 4.0–10.5)
nRBC: 0 % (ref 0.0–0.2)

## 2020-10-15 ENCOUNTER — Telehealth: Payer: Self-pay

## 2020-10-15 NOTE — Telephone Encounter (Signed)
-----   Message from Truitt Merle, MD sent at 10/14/2020 12:51 PM EDT ----- Please let pt know her lab results from 2 weeks ago, plt has normalized since we increase Anagrelide dose, please continue current dose, thanks   Truitt Merle  10/14/2020

## 2020-10-15 NOTE — Telephone Encounter (Signed)
Pt called no answer message left concerning lab results and instructions to continue Anagrelide at same and current dose encouraged to call for any questions concerns or changes

## 2020-10-22 DIAGNOSIS — R918 Other nonspecific abnormal finding of lung field: Secondary | ICD-10-CM | POA: Diagnosis not present

## 2020-10-22 DIAGNOSIS — M25511 Pain in right shoulder: Secondary | ICD-10-CM | POA: Diagnosis not present

## 2020-10-22 DIAGNOSIS — M25562 Pain in left knee: Secondary | ICD-10-CM | POA: Diagnosis not present

## 2020-10-23 ENCOUNTER — Other Ambulatory Visit: Payer: Self-pay | Admitting: Orthopedic Surgery

## 2020-10-23 DIAGNOSIS — R918 Other nonspecific abnormal finding of lung field: Secondary | ICD-10-CM

## 2020-11-01 ENCOUNTER — Ambulatory Visit
Admission: RE | Admit: 2020-11-01 | Discharge: 2020-11-01 | Disposition: A | Discharge status: Home / Self Care | Payer: PPO | Source: Ambulatory Visit | Attending: Orthopedic Surgery | Admitting: Orthopedic Surgery

## 2020-11-01 ENCOUNTER — Other Ambulatory Visit: Payer: Self-pay

## 2020-11-01 DIAGNOSIS — S2243XD Multiple fractures of ribs, bilateral, subsequent encounter for fracture with routine healing: Secondary | ICD-10-CM | POA: Diagnosis not present

## 2020-11-01 DIAGNOSIS — R918 Other nonspecific abnormal finding of lung field: Secondary | ICD-10-CM

## 2020-11-01 DIAGNOSIS — S2241XA Multiple fractures of ribs, right side, initial encounter for closed fracture: Secondary | ICD-10-CM | POA: Diagnosis not present

## 2020-11-01 DIAGNOSIS — I2510 Atherosclerotic heart disease of native coronary artery without angina pectoris: Secondary | ICD-10-CM | POA: Diagnosis not present

## 2020-11-01 DIAGNOSIS — S4991XA Unspecified injury of right shoulder and upper arm, initial encounter: Secondary | ICD-10-CM | POA: Diagnosis not present

## 2020-11-21 NOTE — Progress Notes (Signed)
[place]   [phone] [phone]   Clinic Follow up Note   Patient Care Team: Mayra Neer, MD as PCP - General (Family Medicine)  Date of Service:  11/26/2020  CHIEF COMPLAINT: Follow-up essential thrombocythemia  SUMMARY OF ONCOLOGIC HISTORY: Oncology History  Essential thrombocythemia ([place])  08/13/2014 - 08/14/2014 Hospital Admission   Patient was admitted for acute onset headache and aphasia, spontaneously resolved quickly. Probable TIA versus migraine.    10/17/2014 Miscellaneous   JAK2 V617F mutation (+),     10/24/2014 Initial Diagnosis   Essential thrombocythemia. Pt presented with thrombocytosis with platelet [phone] since 2013.     10/24/2014 Bone Marrow Biopsy   Hypercellular marrow with increased and atypical megakaryocytes. No significant fibrosis. Myeloid and erythroid elements are not overly increased. The overall findings are suggestive of a myeloproliferative neoplasm, especially essential thrombocythemia.     11/06/2014 -  Chemotherapy   Anagrelide 0.5 mg twice daily       CURRENT THERAPY:  Anagrelide 37m daily since 10/2014, added anagrelide to 0.5 mg on Monday Wednesday Friday in 2021 due to persistent thrombocytosis  which was changed to 0.555mpm daily on 08/23/2020  INTERVAL HISTORY:  Yvonne Rose here for a follow up of ET. She was last seen by me 3 months ago. She presents to the clinic alone.  Dorsally had a fall in early May 2022, which resulted 3 ribs fracture and extensive bruise on the right side of her chest.  She has recovered well, pain has resolved.  No other new complaints.  She is tolerating anagrelide very well, no noticeable side effects.   All other systems were reviewed with the patient and are negative.  MEDICAL HISTORY:  Past Medical History:  Diagnosis Date   Anxiety    Hypertension    PONV (postoperative nausea and vomiting)    TIA (transient ischemic attack)     SURGICAL  HISTORY: Past Surgical History:  Procedure Laterality Date   ABDOMINAL HYSTERECTOMY     BACK SURGERY     DILATION AND CURETTAGE OF UTERUS      I have reviewed the social history and family history with the patient and they are unchanged from previous note.  ALLERGIES:  is allergic to propoxyphene and sulfa antibiotics.  MEDICATIONS:  Current Outpatient Medications  Medication Sig Dispense Refill   acetaminophen (TYLENOL) 500 MG tablet Take 500 mg by mouth every 8 (eight) hours as needed for mild pain.      anagrelide (AGRYLIN) 0.5 MG capsule Take 1 capsule (0.5 mg total) by mouth daily. Please take 1 capsule after dinner daily in addition to 45m58maily in morning. 90 capsule 1   anagrelide (AGRYLIN) 1 MG capsule TAKE 1 CAPSULE(1 MG) BY MOUTH DAILY 90 capsule 3   aspirin 81 MG EC tablet Adult Low Dose Aspirin 81 mg tablet,delayed release     hydrochlorothiazide (HYDRODIURIL) 25 MG tablet Take 25 mg by mouth daily.     losartan (COZAAR) 100 MG tablet Take 100 mg by mouth daily.     metoprolol succinate (TOPROL-XL) 100 MG 24 hr tablet metoprolol succinate ER 100 mg tablet,extended release 24 hr  TK 1 T PO ONCE A DAY IN THE EVE     omeprazole (PRILOSEC) 20 MG capsule Take 20 mg by mouth daily.     pravastatin (PRAVACHOL) 80 MG tablet Take 1 tablet (80 mg total) by mouth daily. 30 tablet 2   No current facility-administered medications for this visit.  PHYSICAL EXAMINATION: ECOG PERFORMANCE STATUS: 0 - Asymptomatic  Vitals:   11/26/20 0808  BP: (!) 161/83  Pulse: (!) 102  Resp: 18  Temp: (!) 97.5 F (36.4 C)  SpO2: 97%   Filed Weights   11/26/20 0808  Weight: 160 lb 12.8 oz (72.9 kg)    GENERAL:alert, no distress and comfortable SKIN: skin color, texture, turgor are normal, no rashes or significant lesions EYES: normal, Conjunctiva are pink and non-injected, sclera clear Musculoskeletal:no cyanosis of digits and no clubbing  NEURO: alert & oriented x 3 with fluent  speech, no focal motor/sensory deficits  LABORATORY DATA:  I have reviewed the data as listed CBC Latest Ref Rng & Units 11/26/2020 09/24/2020 08/23/2020  WBC 4.0 - 10.5 K/uL 9.1 11.5(H) 10.4  Hemoglobin 12.0 - 15.0 g/dL 11.6(L) 11.7(L) 12.4  Hematocrit 36.0 - 46.0 % 35.0(L) 36.5 37.3  Platelets 150 - 400 K/uL 211 332 480(H)     CMP Latest Ref Rng & Units 08/23/2020 01/03/2020 12/22/2019  Glucose 70 - 99 mg/dL 94 129(H) 96  BUN 8 - 23 mg/dL 21 22 21   Creatinine 0.44 - 1.00 mg/dL 1.22(H) 1.11(H) 1.19(H)  Sodium 135 - 145 mmol/L 135 134(L) 142  Potassium 3.5 - 5.1 mmol/L 4.2 3.6 4.4  Chloride 98 - 111 mmol/L 105 100 107  CO2 22 - 32 mmol/L 21(L) 20(L) 25  Calcium 8.9 - 10.3 mg/dL 9.8 9.8 10.1  Total Protein 6.5 - 8.1 g/dL 8.0 - 7.4  Total Bilirubin 0.3 - 1.2 mg/dL 0.3 - 0.3  Alkaline Phos 38 - 126 U/L 100 - 84  AST 15 - 41 U/L 20 - 22  ALT 0 - 44 U/L 15 - 19      RADIOGRAPHIC STUDIES: I have personally reviewed the radiological images as listed and agreed with the findings in the report. No results found.   ASSESSMENT & PLAN:  Yvonne Rose is a 75 y.o. adult with   1. Essential thrombocythemia, JAK2 V617F (+) -Her platelet count has been slowly trending up since 2013. The rest of her CBC are normal. -She does not appear to have obvious cause for reactive thrombocytosis, such as iron deficient anemia, chronic infection or chronic inflammation. -Her JAK2 mutation and bone marrow biopsy results are consistent with essential thrombocythemia. -We previously discussed the natural history of ET is usually a benign and indolent disease, not curable, major complications is thrombosis, some patients will develop myelofibrosis at late stage. -Pt previously declined Hydrea due to the concern of side effects. I started her on Anagrelide 665m in 10/2014. Has been tolerating anagrelide very well  -Echocardiogram was unremarkable in July 2021.  Patient is aware of potential cardiac toxicity from  anagrelide -due to her increased platelet count, weight increased anagrelide dose in late 2021 and again in 08/2020 -She is currently on anagrelide to 1 mg in the morning and 0.5 mg in the evening, tolerating well. -Lab reviewed, mild anemia with hemoglobin 11.6, platelet of 211, will continue current dose anagrelide -We will monitor labs every 3 months     2. Hypertension and anxiety -She will continue medications and continue follow-up with her primary care physician   3. Osteopenia -She reports her last DEXA revealed Osteopenia. I encouraged her to take a Vitamin D and Ca supplement.      PLAN: -lab reviewed  -We will continue anagrelide 169mam and 0.65m64mm daily -Lab in 3 months -Lab and follow-up in 6 months   No problem-specific Assessment & Plan  notes found for this encounter.   No orders of the defined types were placed in this encounter.  All questions were answered. The patient knows to call the clinic with any problems, questions or concerns. No barriers to learning was detected. The total time spent in the appointment was 20 minutes.     Truitt Merle, MD 11/26/2020   I, Joslyn Devon, am acting as scribe for Truitt Merle, MD.   I have reviewed the above documentation for accuracy and completeness, and I agree with the above.

## 2020-11-26 ENCOUNTER — Inpatient Hospital Stay: Payer: PPO | Attending: Hematology | Admitting: Hematology

## 2020-11-26 ENCOUNTER — Other Ambulatory Visit: Payer: Self-pay

## 2020-11-26 ENCOUNTER — Encounter: Payer: Self-pay | Admitting: Hematology

## 2020-11-26 ENCOUNTER — Inpatient Hospital Stay: Payer: PPO

## 2020-11-26 VITALS — BP 161/83 | HR 102 | Temp 97.5°F | Resp 18 | Ht 68.0 in | Wt 160.8 lb

## 2020-11-26 DIAGNOSIS — D473 Essential (hemorrhagic) thrombocythemia: Secondary | ICD-10-CM | POA: Diagnosis not present

## 2020-11-26 DIAGNOSIS — Z8673 Personal history of transient ischemic attack (TIA), and cerebral infarction without residual deficits: Secondary | ICD-10-CM | POA: Diagnosis not present

## 2020-11-26 DIAGNOSIS — M8580 Other specified disorders of bone density and structure, unspecified site: Secondary | ICD-10-CM | POA: Diagnosis not present

## 2020-11-26 DIAGNOSIS — D509 Iron deficiency anemia, unspecified: Secondary | ICD-10-CM | POA: Insufficient documentation

## 2020-11-26 DIAGNOSIS — I10 Essential (primary) hypertension: Secondary | ICD-10-CM | POA: Insufficient documentation

## 2020-11-26 DIAGNOSIS — Z79899 Other long term (current) drug therapy: Secondary | ICD-10-CM | POA: Insufficient documentation

## 2020-11-26 LAB — CBC WITH DIFFERENTIAL (CANCER CENTER ONLY)
Abs Immature Granulocytes: 0.13 10*3/uL — ABNORMAL HIGH (ref 0.00–0.07)
Basophils Absolute: 0.1 10*3/uL (ref 0.0–0.1)
Basophils Relative: 1 %
Eosinophils Absolute: 0.3 10*3/uL (ref 0.0–0.5)
Eosinophils Relative: 3 %
HCT: 35 % — ABNORMAL LOW (ref 36.0–46.0)
Hemoglobin: 11.6 g/dL — ABNORMAL LOW (ref 12.0–15.0)
Immature Granulocytes: 1 %
Lymphocytes Relative: 19 %
Lymphs Abs: 1.7 10*3/uL (ref 0.7–4.0)
MCH: 31.2 pg (ref 26.0–34.0)
MCHC: 33.1 g/dL (ref 30.0–36.0)
MCV: 94.1 fL (ref 80.0–100.0)
Monocytes Absolute: 1 10*3/uL (ref 0.1–1.0)
Monocytes Relative: 11 %
Neutro Abs: 6 10*3/uL (ref 1.7–7.7)
Neutrophils Relative %: 65 %
Platelet Count: 211 10*3/uL (ref 150–400)
RBC: 3.72 MIL/uL — ABNORMAL LOW (ref 3.87–5.11)
RDW: 15.3 % (ref 11.5–15.5)
WBC Count: 9.1 10*3/uL (ref 4.0–10.5)
nRBC: 0 % (ref 0.0–0.2)

## 2020-12-28 ENCOUNTER — Ambulatory Visit
Admission: RE | Admit: 2020-12-28 | Discharge: 2020-12-28 | Disposition: A | Discharge status: Home / Self Care | Payer: PPO | Source: Ambulatory Visit | Attending: Family Medicine | Admitting: Family Medicine

## 2020-12-28 ENCOUNTER — Other Ambulatory Visit: Payer: Self-pay

## 2020-12-28 DIAGNOSIS — M8588 Other specified disorders of bone density and structure, other site: Secondary | ICD-10-CM | POA: Diagnosis not present

## 2020-12-28 DIAGNOSIS — Z1231 Encounter for screening mammogram for malignant neoplasm of breast: Secondary | ICD-10-CM | POA: Diagnosis not present

## 2020-12-28 DIAGNOSIS — Z780 Asymptomatic menopausal state: Secondary | ICD-10-CM | POA: Diagnosis not present

## 2020-12-28 DIAGNOSIS — M8580 Other specified disorders of bone density and structure, unspecified site: Secondary | ICD-10-CM

## 2021-01-15 ENCOUNTER — Encounter: Payer: Self-pay | Admitting: Hematology

## 2021-01-21 ENCOUNTER — Other Ambulatory Visit: Payer: Self-pay | Admitting: Nurse Practitioner

## 2021-01-21 DIAGNOSIS — D473 Essential (hemorrhagic) thrombocythemia: Secondary | ICD-10-CM

## 2021-01-21 MED ORDER — ANAGRELIDE HCL 0.5 MG PO CAPS: 0.5000 mg | ORAL_CAPSULE | Freq: Every day | ORAL | 1 refills | Status: DC

## 2021-01-21 MED ORDER — ANAGRELIDE HCL 1 MG PO CAPS: ORAL_CAPSULE | ORAL | 3 refills | Status: DC

## 2021-02-19 DIAGNOSIS — D473 Essential (hemorrhagic) thrombocythemia: Secondary | ICD-10-CM | POA: Diagnosis not present

## 2021-02-19 DIAGNOSIS — R7303 Prediabetes: Secondary | ICD-10-CM | POA: Diagnosis not present

## 2021-02-19 DIAGNOSIS — I129 Hypertensive chronic kidney disease with stage 1 through stage 4 chronic kidney disease, or unspecified chronic kidney disease: Secondary | ICD-10-CM | POA: Diagnosis not present

## 2021-02-19 DIAGNOSIS — E7800 Pure hypercholesterolemia, unspecified: Secondary | ICD-10-CM | POA: Diagnosis not present

## 2021-02-19 DIAGNOSIS — N181 Chronic kidney disease, stage 1: Secondary | ICD-10-CM | POA: Diagnosis not present

## 2021-02-25 ENCOUNTER — Other Ambulatory Visit: Payer: Self-pay

## 2021-02-25 ENCOUNTER — Inpatient Hospital Stay: Payer: PPO | Attending: Hematology

## 2021-02-25 DIAGNOSIS — D473 Essential (hemorrhagic) thrombocythemia: Secondary | ICD-10-CM | POA: Insufficient documentation

## 2021-02-25 LAB — CMP (CANCER CENTER ONLY)
ALT: 14 U/L (ref 0–44)
AST: 19 U/L (ref 15–41)
Albumin: 4.1 g/dL (ref 3.5–5.0)
Alkaline Phosphatase: 87 U/L (ref 38–126)
Anion gap: 8 (ref 5–15)
BUN: 18 mg/dL (ref 8–23)
CO2: 26 mmol/L (ref 22–32)
Calcium: 10.5 mg/dL — ABNORMAL HIGH (ref 8.9–10.3)
Chloride: 107 mmol/L (ref 98–111)
Creatinine: 1.19 mg/dL — ABNORMAL HIGH (ref 0.44–1.00)
GFR, Estimated: 47 mL/min — ABNORMAL LOW (ref >60)
Glucose, Bld: 103 mg/dL — ABNORMAL HIGH (ref 70–99)
Potassium: 4.9 mmol/L (ref 3.5–5.1)
Sodium: 141 mmol/L (ref 135–145)
Total Bilirubin: 0.4 mg/dL (ref 0.3–1.2)
Total Protein: 7.6 g/dL (ref 6.5–8.1)

## 2021-02-25 LAB — CBC WITH DIFFERENTIAL (CANCER CENTER ONLY)
Abs Immature Granulocytes: 0.04 10*3/uL (ref 0.00–0.07)
Basophils Absolute: 0.1 10*3/uL (ref 0.0–0.1)
Basophils Relative: 1 %
Eosinophils Absolute: 0.4 10*3/uL (ref 0.0–0.5)
Eosinophils Relative: 4 %
HCT: 38.8 % (ref 36.0–46.0)
Hemoglobin: 12.1 g/dL (ref 12.0–15.0)
Immature Granulocytes: 0 %
Lymphocytes Relative: 20 %
Lymphs Abs: 1.9 10*3/uL (ref 0.7–4.0)
MCH: 30.4 pg (ref 26.0–34.0)
MCHC: 31.2 g/dL (ref 30.0–36.0)
MCV: 97.5 fL (ref 80.0–100.0)
Monocytes Absolute: 0.9 10*3/uL (ref 0.1–1.0)
Monocytes Relative: 9 %
Neutro Abs: 6.2 10*3/uL (ref 1.7–7.7)
Neutrophils Relative %: 66 %
Platelet Count: 342 10*3/uL (ref 150–400)
RBC: 3.98 MIL/uL (ref 3.87–5.11)
RDW: 14 % (ref 11.5–15.5)
WBC Count: 9.4 10*3/uL (ref 4.0–10.5)
nRBC: 0 % (ref 0.0–0.2)

## 2021-05-27 ENCOUNTER — Encounter: Payer: Self-pay | Admitting: Hematology

## 2021-05-27 ENCOUNTER — Inpatient Hospital Stay: Payer: PPO | Attending: Hematology

## 2021-05-27 ENCOUNTER — Inpatient Hospital Stay: Payer: PPO | Admitting: Hematology

## 2021-05-27 ENCOUNTER — Other Ambulatory Visit: Payer: Self-pay

## 2021-05-27 VITALS — BP 160/86 | HR 80 | Temp 98.2°F | Resp 18 | Ht 68.0 in | Wt 160.4 lb

## 2021-05-27 DIAGNOSIS — Z79899 Other long term (current) drug therapy: Secondary | ICD-10-CM | POA: Diagnosis not present

## 2021-05-27 DIAGNOSIS — Z8673 Personal history of transient ischemic attack (TIA), and cerebral infarction without residual deficits: Secondary | ICD-10-CM | POA: Diagnosis not present

## 2021-05-27 DIAGNOSIS — Z9221 Personal history of antineoplastic chemotherapy: Secondary | ICD-10-CM | POA: Diagnosis not present

## 2021-05-27 DIAGNOSIS — I10 Essential (primary) hypertension: Secondary | ICD-10-CM | POA: Diagnosis not present

## 2021-05-27 DIAGNOSIS — N183 Chronic kidney disease, stage 3 unspecified: Secondary | ICD-10-CM | POA: Diagnosis not present

## 2021-05-27 DIAGNOSIS — D473 Essential (hemorrhagic) thrombocythemia: Secondary | ICD-10-CM

## 2021-05-27 DIAGNOSIS — M8580 Other specified disorders of bone density and structure, unspecified site: Secondary | ICD-10-CM | POA: Diagnosis not present

## 2021-05-27 DIAGNOSIS — I129 Hypertensive chronic kidney disease with stage 1 through stage 4 chronic kidney disease, or unspecified chronic kidney disease: Secondary | ICD-10-CM | POA: Diagnosis not present

## 2021-05-27 LAB — RETIC PANEL
Immature Retic Fract: 12.4 % (ref 2.3–15.9)
RBC.: 3.82 MIL/uL — ABNORMAL LOW (ref 3.87–5.11)
Retic Count, Absolute: 58.4 10*3/uL (ref 19.0–186.0)
Retic Ct Pct: 1.5 % (ref 0.4–3.1)
Reticulocyte Hemoglobin: 34.7 pg (ref >27.9)

## 2021-05-27 LAB — CBC WITH DIFFERENTIAL (CANCER CENTER ONLY)
Abs Immature Granulocytes: 0.04 10*3/uL (ref 0.00–0.07)
Basophils Absolute: 0.1 10*3/uL (ref 0.0–0.1)
Basophils Relative: 1 %
Eosinophils Absolute: 0.3 10*3/uL (ref 0.0–0.5)
Eosinophils Relative: 4 %
HCT: 35.4 % — ABNORMAL LOW (ref 36.0–46.0)
Hemoglobin: 11.5 g/dL — ABNORMAL LOW (ref 12.0–15.0)
Immature Granulocytes: 0 %
Lymphocytes Relative: 21 %
Lymphs Abs: 1.9 10*3/uL (ref 0.7–4.0)
MCH: 30.1 pg (ref 26.0–34.0)
MCHC: 32.5 g/dL (ref 30.0–36.0)
MCV: 92.7 fL (ref 80.0–100.0)
Monocytes Absolute: 0.8 10*3/uL (ref 0.1–1.0)
Monocytes Relative: 9 %
Neutro Abs: 6.1 10*3/uL (ref 1.7–7.7)
Neutrophils Relative %: 65 %
Platelet Count: 287 10*3/uL (ref 150–400)
RBC: 3.82 MIL/uL — ABNORMAL LOW (ref 3.87–5.11)
RDW: 14.7 % (ref 11.5–15.5)
WBC Count: 9.2 10*3/uL (ref 4.0–10.5)
nRBC: 0 % (ref 0.0–0.2)

## 2021-05-27 MED ORDER — ANAGRELIDE HCL 0.5 MG PO CAPS: 0.5000 mg | ORAL_CAPSULE | Freq: Every day | ORAL | 1 refills | Status: DC

## 2021-05-27 NOTE — Progress Notes (Signed)
[place]   Telephone:(336) [phone] Fax:(336) [phone]   Clinic Follow up Note   Patient Care Team: Mayra Neer, MD as PCP - General (Family Medicine)  Date of Service:  05/27/2021  CHIEF COMPLAINT: f/u of essential thrombocytopenia   CURRENT THERAPY:  Anagrelide 815m daily since 10/2014, added anagrelide to 0.5 mg on M/W/F in 2021 due to persistent thrombocytosis, which was changed to 0.572mpm daily on 08/23/2020  ASSESSMENT & PLAN:  Yvonne SCHMELZERs a [age].o. adult with   1. Essential thrombocythemia, JAK2 V617F (+) -Her platelet count has been slowly trending up since 2013. The rest of her CBC are normal. She does not appear to have obvious cause for reactive thrombocytosis, such as iron deficient anemia, chronic infection or chronic inflammation. -Her JAK2 mutation and bone marrow biopsy results are consistent with essential thrombocythemia. -Pt previously declined Hydrea due to the concern of side effects. I started her on Anagrelide 15m6mn 10/2014. Has been tolerating anagrelide very well  -last echo in July 2021 was unremarkable.  Patient is aware of potential cardiac toxicity from anagrelide -due to her increased platelet count, weight increased anagrelide dose in late 2021 and again in 08/2020 -She is currently on anagrelide to 1 mg in the morning and 0.5 mg in the evening, tolerating well. -Lab reviewed, mild anemia with hemoglobin 11.5, platelet of 287, will continue current dose anagrelide -We will monitor labs every 3 months   2. HTN, CKD stage 3, and anxiety -She will continue medications and continue f/u PCP -I advised her to drink more water to lower her creatinine   3. Osteopenia -DEXA 12/28/20 showed osteopenia (T-score -2.0) -I previously encouraged her to take a Vitamin D and Ca supplement.      PLAN: -continue anagrelide 15mg99m and 0.5mg 53mdaily, I refilled today -Lab in 3 months -Lab and follow-up in 6 months   No problem-specific  Assessment & Plan notes found for this encounter.   SUMMARY OF ONCOLOGIC HISTORY: Oncology History  Essential thrombocythemia ([facility]) [place]28/2016 - 08/14/2014 Hospital Admission   Patient was admitted for acute onset headache and aphasia, spontaneously resolved quickly. Probable TIA versus migraine.   10/17/2014 Miscellaneous   JAK2 V617F mutation (+),    10/24/2014 Initial Diagnosis   Essential thrombocythemia. Pt presented with thrombocytosis with platelet [phone] 2013.    10/24/2014 Bone Marrow Biopsy   Hypercellular marrow with increased and atypical megakaryocytes. No significant fibrosis. Myeloid and erythroid elements are not overly increased. The overall findings are suggestive of a myeloproliferative neoplasm, especially essential thrombocythemia.    11/06/2014 -  Chemotherapy   Anagrelide 0.5 mg twice daily      INTERVAL HISTORY:  Yvonne PERDEWere for a follow up of ET. Yvonne CIFELLIlast seen by me on 11/26/20. Yvonne Skeetersents to the clinic alone. She reports she is doing well overall with no new complaints.   All other systems were reviewed with the patient and are negative.  MEDICAL HISTORY:  Past Medical History:  Diagnosis Date   Anxiety    Hypertension    PONV (postoperative nausea and vomiting)    TIA (transient ischemic attack)     SURGICAL HISTORY: Past Surgical History:  Procedure Laterality Date   ABDOMINAL HYSTERECTOMY     BACK SURGERY     DILATION AND CURETTAGE OF UTERUS      I have reviewed the social history and family history with the patient and  they are unchanged from previous note.  ALLERGIES:  is allergic to propoxyphene and sulfa antibiotics.  MEDICATIONS:  Current Outpatient Medications  Medication Sig Dispense Refill   acetaminophen (TYLENOL) 500 MG tablet Take 500 mg by mouth every 8 (eight) hours as needed for mild pain.      anagrelide (AGRYLIN) 0.5 MG capsule Take 1 capsule (0.5 mg total) by mouth  daily. Please take 1 capsule after dinner daily in addition to 44m daily in morning. 90 capsule 1   anagrelide (AGRYLIN) 1 MG capsule TAKE 1 CAPSULE(1 MG) BY MOUTH DAILY 90 capsule 3   aspirin 81 MG EC tablet Adult Low Dose Aspirin 81 mg tablet,delayed release     hydrochlorothiazide (HYDRODIURIL) 25 MG tablet Take 25 mg by mouth daily.     losartan (COZAAR) 100 MG tablet Take 100 mg by mouth daily.     metoprolol succinate (TOPROL-XL) 100 MG 24 hr tablet metoprolol succinate ER 100 mg tablet,extended release 24 hr  TK 1 T PO ONCE A DAY IN THE EVE     omeprazole (PRILOSEC) 20 MG capsule Take 20 mg by mouth daily.     pravastatin (PRAVACHOL) 80 MG tablet Take 1 tablet (80 mg total) by mouth daily. 30 tablet 2   No current facility-administered medications for this visit.    PHYSICAL EXAMINATION: ECOG PERFORMANCE STATUS: 0 - Asymptomatic  Vitals:   05/27/21 0938  BP: (!) 160/86  Pulse: 80  Resp: 18  Temp: 98.2 F (36.8 C)  SpO2: 96%   Wt Readings from Last 3 Encounters:  05/27/21 160 lb 6.4 oz (72.8 kg)  11/26/20 160 lb 12.8 oz (72.9 kg)  08/23/20 163 lb 9.6 oz (74.2 kg)     GENERAL:alert, no distress and comfortable SKIN: skin color normal, no rashes or significant lesions EYES: normal, Conjunctiva are pink and non-injected, sclera clear  NEURO: alert & oriented x 3 with fluent speech  LABORATORY DATA:  I have reviewed the data as listed CBC Latest Ref Rng & Units 05/27/2021 02/25/2021 11/26/2020  WBC 4.0 - 10.5 K/uL 9.2 9.4 9.1  Hemoglobin 12.0 - 15.0 g/dL 11.5(L) 12.1 11.6(L)  Hematocrit 36.0 - 46.0 % 35.4(L) 38.8 35.0(L)  Platelets 150 - 400 K/uL 287 342 211     CMP Latest Ref Rng & Units 02/25/2021 08/23/2020 01/03/2020  Glucose 70 - 99 mg/dL 103(H) 94 129(H)  BUN 8 - 23 mg/dL 18 21 22   Creatinine 0.44 - 1.00 mg/dL 1.19(H) 1.22(H) 1.11(H)  Sodium 135 - 145 mmol/L 141 135 134(L)  Potassium 3.5 - 5.1 mmol/L 4.9 4.2 3.6  Chloride 98 - 111 mmol/L 107 105 100  CO2 22 -  32 mmol/L 26 21(L) 20(L)  Calcium 8.9 - 10.3 mg/dL 10.5(H) 9.8 9.8  Total Protein 6.5 - 8.1 g/dL 7.6 8.0 -  Total Bilirubin 0.3 - 1.2 mg/dL 0.4 0.3 -  Alkaline Phos 38 - 126 U/L 87 100 -  AST 15 - 41 U/L 19 20 -  ALT 0 - 44 U/L 14 15 -      RADIOGRAPHIC STUDIES: I have personally reviewed the radiological images as listed and agreed with the findings in the report. No results found.    Orders Placed This Encounter  Procedures   Ferritin    Standing Status:   Standing    Number of Occurrences:   2    Standing Expiration Date:   05/27/2022   All questions were answered. The patient knows to call the clinic with any problems,  questions or concerns. No barriers to learning was detected. The total time spent in the appointment was 20 minutes.     Truitt Merle, MD 05/27/2021   I, Wilburn Mylar, am acting as scribe for Truitt Merle, MD.   I have reviewed the above documentation for accuracy and completeness, and I agree with the above.

## 2021-08-08 DIAGNOSIS — N181 Chronic kidney disease, stage 1: Secondary | ICD-10-CM | POA: Diagnosis not present

## 2021-08-08 DIAGNOSIS — E7800 Pure hypercholesterolemia, unspecified: Secondary | ICD-10-CM | POA: Diagnosis not present

## 2021-08-08 DIAGNOSIS — D473 Essential (hemorrhagic) thrombocythemia: Secondary | ICD-10-CM | POA: Diagnosis not present

## 2021-08-08 DIAGNOSIS — M899 Disorder of bone, unspecified: Secondary | ICD-10-CM | POA: Diagnosis not present

## 2021-08-08 DIAGNOSIS — Z8673 Personal history of transient ischemic attack (TIA), and cerebral infarction without residual deficits: Secondary | ICD-10-CM | POA: Diagnosis not present

## 2021-08-08 DIAGNOSIS — G4700 Insomnia, unspecified: Secondary | ICD-10-CM | POA: Diagnosis not present

## 2021-08-08 DIAGNOSIS — Z0000 Encounter for general adult medical examination without abnormal findings: Secondary | ICD-10-CM | POA: Diagnosis not present

## 2021-08-08 DIAGNOSIS — D649 Anemia, unspecified: Secondary | ICD-10-CM | POA: Diagnosis not present

## 2021-08-08 DIAGNOSIS — R7303 Prediabetes: Secondary | ICD-10-CM | POA: Diagnosis not present

## 2021-08-08 DIAGNOSIS — I129 Hypertensive chronic kidney disease with stage 1 through stage 4 chronic kidney disease, or unspecified chronic kidney disease: Secondary | ICD-10-CM | POA: Diagnosis not present

## 2021-08-08 DIAGNOSIS — F411 Generalized anxiety disorder: Secondary | ICD-10-CM | POA: Diagnosis not present

## 2021-08-19 DIAGNOSIS — D492 Neoplasm of unspecified behavior of bone, soft tissue, and skin: Secondary | ICD-10-CM | POA: Diagnosis not present

## 2021-08-19 DIAGNOSIS — L570 Actinic keratosis: Secondary | ICD-10-CM | POA: Diagnosis not present

## 2021-08-19 DIAGNOSIS — C44311 Basal cell carcinoma of skin of nose: Secondary | ICD-10-CM | POA: Diagnosis not present

## 2021-08-19 DIAGNOSIS — Z85828 Personal history of other malignant neoplasm of skin: Secondary | ICD-10-CM | POA: Diagnosis not present

## 2021-08-19 DIAGNOSIS — L814 Other melanin hyperpigmentation: Secondary | ICD-10-CM | POA: Diagnosis not present

## 2021-08-19 DIAGNOSIS — Z08 Encounter for follow-up examination after completed treatment for malignant neoplasm: Secondary | ICD-10-CM | POA: Diagnosis not present

## 2021-08-26 ENCOUNTER — Inpatient Hospital Stay: Payer: PPO | Attending: Hematology

## 2021-08-26 ENCOUNTER — Other Ambulatory Visit: Payer: Self-pay

## 2021-08-26 DIAGNOSIS — D473 Essential (hemorrhagic) thrombocythemia: Secondary | ICD-10-CM | POA: Diagnosis not present

## 2021-08-26 LAB — CBC WITH DIFFERENTIAL (CANCER CENTER ONLY)
Abs Immature Granulocytes: 0.03 10*3/uL (ref 0.00–0.07)
Basophils Absolute: 0.1 10*3/uL (ref 0.0–0.1)
Basophils Relative: 1 %
Eosinophils Absolute: 0.3 10*3/uL (ref 0.0–0.5)
Eosinophils Relative: 3 %
HCT: 35.7 % — ABNORMAL LOW (ref 36.0–46.0)
Hemoglobin: 11.3 g/dL — ABNORMAL LOW (ref 12.0–15.0)
Immature Granulocytes: 0 %
Lymphocytes Relative: 19 %
Lymphs Abs: 1.9 10*3/uL (ref 0.7–4.0)
MCH: 30 pg (ref 26.0–34.0)
MCHC: 31.7 g/dL (ref 30.0–36.0)
MCV: 94.7 fL (ref 80.0–100.0)
Monocytes Absolute: 0.8 10*3/uL (ref 0.1–1.0)
Monocytes Relative: 8 %
Neutro Abs: 6.7 10*3/uL (ref 1.7–7.7)
Neutrophils Relative %: 69 %
Platelet Count: 324 10*3/uL (ref 150–400)
RBC: 3.77 MIL/uL — ABNORMAL LOW (ref 3.87–5.11)
RDW: 14.9 % (ref 11.5–15.5)
WBC Count: 9.9 10*3/uL (ref 4.0–10.5)
nRBC: 0 % (ref 0.0–0.2)

## 2021-08-26 LAB — RETIC PANEL
Immature Retic Fract: 14.9 % (ref 2.3–15.9)
RBC.: 3.77 MIL/uL — ABNORMAL LOW (ref 3.87–5.11)
Retic Count, Absolute: 59.2 10*3/uL (ref 19.0–186.0)
Retic Ct Pct: 1.6 % (ref 0.4–3.1)
Reticulocyte Hemoglobin: 33.9 pg (ref >27.9)

## 2021-08-26 LAB — FERRITIN: Ferritin: 73 ng/mL (ref 11–307)

## 2021-10-17 DIAGNOSIS — Z1211 Encounter for screening for malignant neoplasm of colon: Secondary | ICD-10-CM | POA: Diagnosis not present

## 2021-10-17 DIAGNOSIS — R195 Other fecal abnormalities: Secondary | ICD-10-CM | POA: Diagnosis not present

## 2021-11-08 DIAGNOSIS — M25561 Pain in right knee: Secondary | ICD-10-CM | POA: Diagnosis not present

## 2021-11-13 ENCOUNTER — Telehealth: Payer: Self-pay | Admitting: Hematology

## 2021-11-13 NOTE — Telephone Encounter (Signed)
Left message with rescheduled upcoming appointment due to provider's PAL. 

## 2021-11-25 ENCOUNTER — Ambulatory Visit: Payer: PPO | Admitting: Hematology

## 2021-11-25 ENCOUNTER — Other Ambulatory Visit: Payer: PPO

## 2021-12-08 ENCOUNTER — Other Ambulatory Visit: Payer: Self-pay | Admitting: Hematology

## 2021-12-08 DIAGNOSIS — D473 Essential (hemorrhagic) thrombocythemia: Secondary | ICD-10-CM

## 2021-12-10 ENCOUNTER — Other Ambulatory Visit: Payer: Self-pay | Admitting: Nurse Practitioner

## 2021-12-10 ENCOUNTER — Other Ambulatory Visit: Payer: Self-pay

## 2021-12-10 ENCOUNTER — Encounter: Payer: Self-pay | Admitting: Hematology

## 2021-12-10 ENCOUNTER — Other Ambulatory Visit: Payer: Self-pay | Admitting: Hematology

## 2021-12-10 ENCOUNTER — Inpatient Hospital Stay: Payer: PPO | Attending: Hematology

## 2021-12-10 ENCOUNTER — Inpatient Hospital Stay: Payer: PPO | Admitting: Hematology

## 2021-12-10 VITALS — BP 163/69 | HR 69 | Temp 97.7°F | Resp 17 | Ht 68.0 in | Wt 156.0 lb

## 2021-12-10 DIAGNOSIS — F419 Anxiety disorder, unspecified: Secondary | ICD-10-CM | POA: Insufficient documentation

## 2021-12-10 DIAGNOSIS — I129 Hypertensive chronic kidney disease with stage 1 through stage 4 chronic kidney disease, or unspecified chronic kidney disease: Secondary | ICD-10-CM | POA: Insufficient documentation

## 2021-12-10 DIAGNOSIS — D473 Essential (hemorrhagic) thrombocythemia: Secondary | ICD-10-CM | POA: Insufficient documentation

## 2021-12-10 DIAGNOSIS — Z79899 Other long term (current) drug therapy: Secondary | ICD-10-CM | POA: Diagnosis not present

## 2021-12-10 DIAGNOSIS — Z7982 Long term (current) use of aspirin: Secondary | ICD-10-CM | POA: Diagnosis not present

## 2021-12-10 DIAGNOSIS — D72829 Elevated white blood cell count, unspecified: Secondary | ICD-10-CM | POA: Diagnosis not present

## 2021-12-10 DIAGNOSIS — M8580 Other specified disorders of bone density and structure, unspecified site: Secondary | ICD-10-CM | POA: Insufficient documentation

## 2021-12-10 DIAGNOSIS — N183 Chronic kidney disease, stage 3 unspecified: Secondary | ICD-10-CM | POA: Insufficient documentation

## 2021-12-10 LAB — CBC WITH DIFFERENTIAL (CANCER CENTER ONLY)
Abs Immature Granulocytes: 0.07 10*3/uL (ref 0.00–0.07)
Basophils Absolute: 0.1 10*3/uL (ref 0.0–0.1)
Basophils Relative: 1 %
Eosinophils Absolute: 0.3 10*3/uL (ref 0.0–0.5)
Eosinophils Relative: 3 %
HCT: 34.4 % — ABNORMAL LOW (ref 36.0–46.0)
Hemoglobin: 11.1 g/dL — ABNORMAL LOW (ref 12.0–15.0)
Immature Granulocytes: 1 %
Lymphocytes Relative: 20 %
Lymphs Abs: 2.1 10*3/uL (ref 0.7–4.0)
MCH: 30.9 pg (ref 26.0–34.0)
MCHC: 32.3 g/dL (ref 30.0–36.0)
MCV: 95.8 fL (ref 80.0–100.0)
Monocytes Absolute: 0.8 10*3/uL (ref 0.1–1.0)
Monocytes Relative: 8 %
Neutro Abs: 7.5 10*3/uL (ref 1.7–7.7)
Neutrophils Relative %: 67 %
Platelet Count: 327 10*3/uL (ref 150–400)
RBC: 3.59 MIL/uL — ABNORMAL LOW (ref 3.87–5.11)
RDW: 15.7 % — ABNORMAL HIGH (ref 11.5–15.5)
WBC Count: 10.9 10*3/uL — ABNORMAL HIGH (ref 4.0–10.5)
nRBC: 0 % (ref 0.0–0.2)

## 2021-12-10 LAB — COMPREHENSIVE METABOLIC PANEL
ALT: 12 U/L (ref 0–44)
AST: 19 U/L (ref 15–41)
Albumin: 4.2 g/dL (ref 3.5–5.0)
Alkaline Phosphatase: 74 U/L (ref 38–126)
Anion gap: 4 — ABNORMAL LOW (ref 5–15)
BUN: 20 mg/dL (ref 8–23)
CO2: 29 mmol/L (ref 22–32)
Calcium: 9.9 mg/dL (ref 8.9–10.3)
Chloride: 105 mmol/L (ref 98–111)
Creatinine, Ser: 1.37 mg/dL — ABNORMAL HIGH (ref 0.44–1.00)
GFR, Estimated: 40 mL/min — ABNORMAL LOW (ref >60)
Glucose, Bld: 106 mg/dL — ABNORMAL HIGH (ref 70–99)
Potassium: 4.1 mmol/L (ref 3.5–5.1)
Sodium: 138 mmol/L (ref 135–145)
Total Bilirubin: 0.4 mg/dL (ref 0.3–1.2)
Total Protein: 7.1 g/dL (ref 6.5–8.1)

## 2021-12-10 LAB — RETIC PANEL
Immature Retic Fract: 13.4 % (ref 2.3–15.9)
RBC.: 3.58 MIL/uL — ABNORMAL LOW (ref 3.87–5.11)
Retic Count, Absolute: 59.4 10*3/uL (ref 19.0–186.0)
Retic Ct Pct: 1.7 % (ref 0.4–3.1)
Reticulocyte Hemoglobin: 33.4 pg (ref >27.9)

## 2021-12-19 DIAGNOSIS — Z1211 Encounter for screening for malignant neoplasm of colon: Secondary | ICD-10-CM | POA: Diagnosis not present

## 2021-12-19 DIAGNOSIS — K5730 Diverticulosis of large intestine without perforation or abscess without bleeding: Secondary | ICD-10-CM | POA: Diagnosis not present

## 2021-12-19 DIAGNOSIS — K635 Polyp of colon: Secondary | ICD-10-CM | POA: Diagnosis not present

## 2021-12-19 DIAGNOSIS — Z800 Family history of malignant neoplasm of digestive organs: Secondary | ICD-10-CM | POA: Diagnosis not present

## 2021-12-23 DIAGNOSIS — K635 Polyp of colon: Secondary | ICD-10-CM | POA: Diagnosis not present

## 2021-12-24 ENCOUNTER — Telehealth: Payer: Self-pay | Admitting: Hematology

## 2021-12-24 NOTE — Telephone Encounter (Signed)
Left message with rescheduled upcoming appointment due to provider's breast clinic. 

## 2022-02-05 DIAGNOSIS — D473 Essential (hemorrhagic) thrombocythemia: Secondary | ICD-10-CM | POA: Diagnosis not present

## 2022-02-05 DIAGNOSIS — K5790 Diverticulosis of intestine, part unspecified, without perforation or abscess without bleeding: Secondary | ICD-10-CM | POA: Diagnosis not present

## 2022-02-05 DIAGNOSIS — R7303 Prediabetes: Secondary | ICD-10-CM | POA: Diagnosis not present

## 2022-02-05 DIAGNOSIS — L989 Disorder of the skin and subcutaneous tissue, unspecified: Secondary | ICD-10-CM | POA: Diagnosis not present

## 2022-02-05 DIAGNOSIS — Z800 Family history of malignant neoplasm of digestive organs: Secondary | ICD-10-CM | POA: Diagnosis not present

## 2022-02-05 DIAGNOSIS — I700 Atherosclerosis of aorta: Secondary | ICD-10-CM | POA: Diagnosis not present

## 2022-02-05 DIAGNOSIS — E7800 Pure hypercholesterolemia, unspecified: Secondary | ICD-10-CM | POA: Diagnosis not present

## 2022-02-05 DIAGNOSIS — N181 Chronic kidney disease, stage 1: Secondary | ICD-10-CM | POA: Diagnosis not present

## 2022-02-05 DIAGNOSIS — I129 Hypertensive chronic kidney disease with stage 1 through stage 4 chronic kidney disease, or unspecified chronic kidney disease: Secondary | ICD-10-CM | POA: Diagnosis not present

## 2022-02-10 ENCOUNTER — Other Ambulatory Visit: Payer: Self-pay | Admitting: Family Medicine

## 2022-02-10 DIAGNOSIS — Z800 Family history of malignant neoplasm of digestive organs: Secondary | ICD-10-CM

## 2022-03-12 DIAGNOSIS — Z23 Encounter for immunization: Secondary | ICD-10-CM | POA: Diagnosis not present

## 2022-04-24 ENCOUNTER — Other Ambulatory Visit: Payer: Self-pay | Admitting: Hematology

## 2022-06-02 ENCOUNTER — Telehealth: Payer: Self-pay | Admitting: Hematology

## 2022-06-02 NOTE — Telephone Encounter (Signed)
Patient aware of appointment change  

## 2022-06-05 ENCOUNTER — Telehealth: Payer: Self-pay | Admitting: Hematology

## 2022-06-05 NOTE — Telephone Encounter (Signed)
Called patient to notify of changed appointment time. Patient notified.

## 2022-06-11 ENCOUNTER — Ambulatory Visit: Payer: PPO | Admitting: Hematology

## 2022-06-11 ENCOUNTER — Other Ambulatory Visit: Payer: PPO

## 2022-06-11 NOTE — Progress Notes (Unsigned)
[facility] OFFICE PROGRESS NOTE  Yvonne Neer, MD [address] [place] [address]  DIAGNOSIS: f/u of essential thrombocytopenia    Oncology History  Essential thrombocythemia ([place])  08/13/2014 - 08/14/2014 Hospital Admission   Patient was admitted for acute onset headache and aphasia, spontaneously resolved quickly. Probable TIA versus migraine.   10/17/2014 Miscellaneous   JAK2 V617F mutation (+),    10/24/2014 Initial Diagnosis   Essential thrombocythemia. Pt presented with thrombocytosis with platelet [phone] since 2013.    10/24/2014 Bone Marrow Biopsy   Hypercellular marrow with increased and atypical megakaryocytes. No significant fibrosis. Myeloid and erythroid elements are not overly increased. The overall findings are suggestive of a myeloproliferative neoplasm, especially essential thrombocythemia.    11/06/2014 -  Chemotherapy   Anagrelide 0.5 mg twice daily    CURRENT THERAPY:  Anagrelide since 10/2014             -current dose: 93m AM and 0.544mPM dail  INTERVAL HISTORY: DoZAYLIE GISLER89.o. adult returns to the clinic today for follow-up visit.  The patient was last seen by Dr. FeBurr Medico months ago.  The patient is currently taking anagrelide for essential thrombocythemia.  She tolerates this well without any concerning adverse side effects.  Today she denies any fever, chills, night sweats, unexplained weight loss, early satiety, or lymphadenopathy.  Denies any chest pain, shortness of breath, cough, or hemoptysis.  Denies any headache or visual changes.  Denies any recent strokes.  Denies any peripheral neuropathy. No recent heart attacks. No abnormal bleeding or bruising. She is here for evaluation and repeat blood work.    MEDICAL HISTORY: Past Medical History:  Diagnosis Date   Anxiety    Hypertension    PONV (postoperative nausea and vomiting)    TIA (transient ischemic attack)     ALLERGIES:  is allergic to  propoxyphene and sulfa antibiotics.  MEDICATIONS:  Current Outpatient Medications  Medication Sig Dispense Refill   acetaminophen (TYLENOL) 500 MG tablet Take 500 mg by mouth every 8 (eight) hours as needed for mild pain.      anagrelide (AGRYLIN) 0.5 MG capsule Take 1 capsule by mouth daily after dinner in addition to the 1 mg daily in the morning. 90 capsule 0   anagrelide (AGRYLIN) 1 MG capsule Take 1 capsule (1 mg) by mouth once daily. 90 capsule 2   aspirin 81 MG EC tablet Adult Low Dose Aspirin 81 mg tablet,delayed release     hydrochlorothiazide (HYDRODIURIL) 25 MG tablet Take 25 mg by mouth daily.     losartan (COZAAR) 100 MG tablet Take 100 mg by mouth daily.     metoprolol succinate (TOPROL-XL) 100 MG 24 hr tablet metoprolol succinate ER 100 mg tablet,extended release 24 hr  TK 1 T PO ONCE A DAY IN THE EVE     omeprazole (PRILOSEC) 20 MG capsule Take 20 mg by mouth daily.     pravastatin (PRAVACHOL) 80 MG tablet Take 1 tablet (80 mg total) by mouth daily. 30 tablet 2   No current facility-administered medications for this visit.    SURGICAL HISTORY:  Past Surgical History:  Procedure Laterality Date   ABDOMINAL HYSTERECTOMY     BACK SURGERY     DILATION AND CURETTAGE OF UTERUS      REVIEW OF SYSTEMS:   Review of Systems  Constitutional: Negative for appetite change, chills, fatigue, fever and unexpected weight change.  HENT:   Negative for mouth sores, nosebleeds,  sore throat and trouble swallowing.   Eyes: Negative for eye problems and icterus.  Respiratory: Negative for cough, hemoptysis, shortness of breath and wheezing.   Cardiovascular: Negative for chest pain and leg swelling.  Gastrointestinal: Negative for abdominal pain, constipation, diarrhea, nausea and vomiting.  Genitourinary: Negative for bladder incontinence, difficulty urinating, dysuria, frequency and hematuria.   Musculoskeletal: Negative for back pain, gait problem, neck pain and neck stiffness.   Skin: Negative for itching and rash.  Neurological: Negative for dizziness, extremity weakness, gait problem, headaches, light-headedness and seizures.  Hematological: Negative for adenopathy. Does not bruise/bleed easily.  Psychiatric/Behavioral: Negative for confusion, depression and sleep disturbance. The patient is not nervous/anxious.     PHYSICAL EXAMINATION:  There were no vitals taken for this visit.  ECOG PERFORMANCE STATUS: {CHL ONC ECOG [id number]  Physical Exam  Constitutional: Oriented to person, place, and time and well-developed, well-nourished, and in no distress. No distress.  HENT:  Head: Normocephalic and atraumatic.  Mouth/Throat: Oropharynx is clear and moist. No oropharyngeal exudate.  Eyes: Conjunctivae are normal. Right eye exhibits no discharge. Left eye exhibits no discharge. No scleral icterus.  Neck: Normal range of motion. Neck supple.  Cardiovascular: Normal rate, regular rhythm, normal heart sounds and intact distal pulses.   Pulmonary/Chest: Effort normal and breath sounds normal. No respiratory distress. No wheezes. No rales.  Abdominal: Soft. Bowel sounds are normal. Exhibits no distension and no mass. There is no tenderness.  Musculoskeletal: Normal range of motion. Exhibits no edema.  Lymphadenopathy:    No cervical adenopathy.  Neurological: Alert and oriented to person, place, and time. Exhibits normal muscle tone. Gait normal. Coordination normal.  Skin: Skin is warm and dry. No rash noted. Not diaphoretic. No erythema. No pallor.  Psychiatric: Mood, memory and judgment normal.  Vitals reviewed.  LABORATORY DATA: Lab Results  Component Value Date   WBC 10.9 (H) 12/10/2021   HGB 11.1 (L) 12/10/2021   HCT 34.4 (L) 12/10/2021   MCV 95.8 12/10/2021   PLT 327 12/10/2021      Chemistry      Component Value Date/Time   NA 138 12/10/2021 1027   NA 141 04/22/2017 1243   K 4.1 12/10/2021 1027   K 4.2 04/22/2017 1243   CL 105 12/10/2021  1027   CO2 29 12/10/2021 1027   CO2 26 04/22/2017 1243   BUN 20 12/10/2021 1027   BUN 16.1 04/22/2017 1243   CREATININE 1.37 (H) 12/10/2021 1027   CREATININE 1.19 (H) 02/25/2021 0901   CREATININE 0.8 04/22/2017 1243      Component Value Date/Time   CALCIUM 9.9 12/10/2021 1027   CALCIUM 10.0 04/22/2017 1243   ALKPHOS 74 12/10/2021 1027   ALKPHOS 76 04/22/2017 1243   AST 19 12/10/2021 1027   AST 19 02/25/2021 0901   AST 26 04/22/2017 1243   ALT 12 12/10/2021 1027   ALT 14 02/25/2021 0901   ALT 18 04/22/2017 1243   BILITOT 0.4 12/10/2021 1027   BILITOT 0.4 02/25/2021 0901   BILITOT 0.24 04/22/2017 1243       RADIOGRAPHIC STUDIES:  No results found.   ASSESSMENT/PLAN:  VERENIS NICOSIA is a 76 y.o. adult with    1. Essential thrombocythemia, JAK2 V617F (+) -Her platelet count has been slowly trending up since 2013. The rest of her CBC are normal. She does not appear to have obvious cause for reactive thrombocytosis, such as iron deficient anemia, chronic infection or chronic inflammation. -Her JAK2 mutation and bone marrow biopsy  results are consistent with essential thrombocythemia. -Pt previously declined Hydrea due to the concern of side effects. I started her on Anagrelide 64m in 10/2014. Has been tolerating anagrelide very well  -last echo in July 2021 was unremarkable.  Patient is aware of potential cardiac toxicity from anagrelide -She is currently on anagrelide to 1 mg in the morning and 0.5 mg in the evening, tolerating well. -Lab reviewed, platelet of ***k, will continue current dose anagrelide -We will monitor labs every 3 months   2. Mild Anemia, Leukocytosis -hgb fluctuates 11-12.5, and WBC 9-11.5 -hgb *** and WBC *** today (06/12/22)   2. HTN, CKD stage 3, and anxiety -She will continue medications and continue f/u PCP -creatinine *** today (06/12/22).    3. Osteopenia -DEXA 12/28/20 showed osteopenia (T-score -2.0) -Dr. FBurr Medicopreviously encouraged her to  take a Vitamin D and Ca supplement. ***     PLAN: -continue anagrelide 154mam and 0.51m107mm daily -Lab and follow-up in 6 months, she will see her PCP and lab in 2-3 months   No orders of the defined types were placed in this encounter.    I spent {CHL ONC TIME VISIT - SWIFXTKW:[id number]}unseling the patient face to face. The total time spent in the appointment was {CHL ONC TIME VISIT - SWIMEQAS:[id number]}Kess Mcilwain L Ballard Budney, PA-C 06/11/22

## 2022-06-12 ENCOUNTER — Other Ambulatory Visit: Payer: Self-pay

## 2022-06-12 ENCOUNTER — Ambulatory Visit: Payer: PPO | Admitting: Nurse Practitioner

## 2022-06-12 ENCOUNTER — Inpatient Hospital Stay: Payer: PPO | Attending: Physician Assistant

## 2022-06-12 ENCOUNTER — Ambulatory Visit: Payer: PPO | Admitting: Hematology

## 2022-06-12 ENCOUNTER — Other Ambulatory Visit: Payer: PPO

## 2022-06-12 ENCOUNTER — Inpatient Hospital Stay: Payer: PPO | Admitting: Physician Assistant

## 2022-06-12 VITALS — BP 158/79 | HR 71 | Temp 98.4°F | Resp 15 | Wt 157.6 lb

## 2022-06-12 DIAGNOSIS — N183 Chronic kidney disease, stage 3 unspecified: Secondary | ICD-10-CM | POA: Diagnosis not present

## 2022-06-12 DIAGNOSIS — M8580 Other specified disorders of bone density and structure, unspecified site: Secondary | ICD-10-CM | POA: Diagnosis not present

## 2022-06-12 DIAGNOSIS — D649 Anemia, unspecified: Secondary | ICD-10-CM | POA: Insufficient documentation

## 2022-06-12 DIAGNOSIS — Z79899 Other long term (current) drug therapy: Secondary | ICD-10-CM | POA: Diagnosis not present

## 2022-06-12 DIAGNOSIS — D473 Essential (hemorrhagic) thrombocythemia: Secondary | ICD-10-CM | POA: Diagnosis not present

## 2022-06-12 DIAGNOSIS — I129 Hypertensive chronic kidney disease with stage 1 through stage 4 chronic kidney disease, or unspecified chronic kidney disease: Secondary | ICD-10-CM | POA: Insufficient documentation

## 2022-06-12 LAB — RETIC PANEL
Immature Retic Fract: 12.2 % (ref 2.3–15.9)
RBC.: 3.8 MIL/uL — ABNORMAL LOW (ref 3.87–5.11)
Retic Count, Absolute: 49.4 10*3/uL (ref 19.0–186.0)
Retic Ct Pct: 1.3 % (ref 0.4–3.1)
Reticulocyte Hemoglobin: 33.5 pg (ref >27.9)

## 2022-06-12 LAB — COMPREHENSIVE METABOLIC PANEL
ALT: 9 U/L (ref 0–44)
AST: 16 U/L (ref 15–41)
Albumin: 4.2 g/dL (ref 3.5–5.0)
Alkaline Phosphatase: 80 U/L (ref 38–126)
Anion gap: 8 (ref 5–15)
BUN: 24 mg/dL — ABNORMAL HIGH (ref 8–23)
CO2: 27 mmol/L (ref 22–32)
Calcium: 10 mg/dL (ref 8.9–10.3)
Chloride: 102 mmol/L (ref 98–111)
Creatinine, Ser: 1.15 mg/dL — ABNORMAL HIGH (ref 0.44–1.00)
GFR, Estimated: 49 mL/min — ABNORMAL LOW (ref >60)
Glucose, Bld: 114 mg/dL — ABNORMAL HIGH (ref 70–99)
Potassium: 4.2 mmol/L (ref 3.5–5.1)
Sodium: 137 mmol/L (ref 135–145)
Total Bilirubin: 0.4 mg/dL (ref 0.3–1.2)
Total Protein: 7.4 g/dL (ref 6.5–8.1)

## 2022-06-12 LAB — CBC WITH DIFFERENTIAL (CANCER CENTER ONLY)
Abs Immature Granulocytes: 0.06 10*3/uL (ref 0.00–0.07)
Basophils Absolute: 0.1 10*3/uL (ref 0.0–0.1)
Basophils Relative: 1 %
Eosinophils Absolute: 0.4 10*3/uL (ref 0.0–0.5)
Eosinophils Relative: 4 %
HCT: 36.8 % (ref 36.0–46.0)
Hemoglobin: 11.8 g/dL — ABNORMAL LOW (ref 12.0–15.0)
Immature Granulocytes: 1 %
Lymphocytes Relative: 15 %
Lymphs Abs: 1.7 10*3/uL (ref 0.7–4.0)
MCH: 30.8 pg (ref 26.0–34.0)
MCHC: 32.1 g/dL (ref 30.0–36.0)
MCV: 96.1 fL (ref 80.0–100.0)
Monocytes Absolute: 0.8 10*3/uL (ref 0.1–1.0)
Monocytes Relative: 7 %
Neutro Abs: 8.1 10*3/uL — ABNORMAL HIGH (ref 1.7–7.7)
Neutrophils Relative %: 72 %
Platelet Count: 307 10*3/uL (ref 150–400)
RBC: 3.83 MIL/uL — ABNORMAL LOW (ref 3.87–5.11)
RDW: 15 % (ref 11.5–15.5)
WBC Count: 11.1 10*3/uL — ABNORMAL HIGH (ref 4.0–10.5)
nRBC: 0 % (ref 0.0–0.2)

## 2022-06-30 DIAGNOSIS — M25562 Pain in left knee: Secondary | ICD-10-CM | POA: Diagnosis not present

## 2022-07-09 ENCOUNTER — Other Ambulatory Visit: Payer: Self-pay | Admitting: Hematology

## 2022-07-23 DIAGNOSIS — M25562 Pain in left knee: Secondary | ICD-10-CM | POA: Diagnosis not present

## 2022-08-06 ENCOUNTER — Other Ambulatory Visit: Payer: Self-pay | Admitting: Nurse Practitioner

## 2022-08-06 DIAGNOSIS — D473 Essential (hemorrhagic) thrombocythemia: Secondary | ICD-10-CM

## 2022-08-12 DIAGNOSIS — D473 Essential (hemorrhagic) thrombocythemia: Secondary | ICD-10-CM | POA: Diagnosis not present

## 2022-08-12 DIAGNOSIS — Z9181 History of falling: Secondary | ICD-10-CM | POA: Diagnosis not present

## 2022-08-12 DIAGNOSIS — I129 Hypertensive chronic kidney disease with stage 1 through stage 4 chronic kidney disease, or unspecified chronic kidney disease: Secondary | ICD-10-CM | POA: Diagnosis not present

## 2022-08-12 DIAGNOSIS — F411 Generalized anxiety disorder: Secondary | ICD-10-CM | POA: Diagnosis not present

## 2022-08-12 DIAGNOSIS — Z0000 Encounter for general adult medical examination without abnormal findings: Secondary | ICD-10-CM | POA: Diagnosis not present

## 2022-08-12 DIAGNOSIS — N183 Chronic kidney disease, stage 3 unspecified: Secondary | ICD-10-CM | POA: Diagnosis not present

## 2022-08-12 DIAGNOSIS — E7800 Pure hypercholesterolemia, unspecified: Secondary | ICD-10-CM | POA: Diagnosis not present

## 2022-08-12 DIAGNOSIS — D649 Anemia, unspecified: Secondary | ICD-10-CM | POA: Diagnosis not present

## 2022-08-12 DIAGNOSIS — M899 Disorder of bone, unspecified: Secondary | ICD-10-CM | POA: Diagnosis not present

## 2022-08-12 DIAGNOSIS — R7303 Prediabetes: Secondary | ICD-10-CM | POA: Diagnosis not present

## 2022-08-12 DIAGNOSIS — Z8673 Personal history of transient ischemic attack (TIA), and cerebral infarction without residual deficits: Secondary | ICD-10-CM | POA: Diagnosis not present

## 2022-08-12 DIAGNOSIS — I700 Atherosclerosis of aorta: Secondary | ICD-10-CM | POA: Diagnosis not present

## 2022-08-28 DIAGNOSIS — N179 Acute kidney failure, unspecified: Secondary | ICD-10-CM | POA: Diagnosis not present

## 2022-09-27 ENCOUNTER — Other Ambulatory Visit: Payer: Self-pay | Admitting: Hematology

## 2022-10-02 DIAGNOSIS — N179 Acute kidney failure, unspecified: Secondary | ICD-10-CM | POA: Diagnosis not present

## 2022-10-27 DIAGNOSIS — I129 Hypertensive chronic kidney disease with stage 1 through stage 4 chronic kidney disease, or unspecified chronic kidney disease: Secondary | ICD-10-CM | POA: Diagnosis not present

## 2022-10-27 DIAGNOSIS — N179 Acute kidney failure, unspecified: Secondary | ICD-10-CM | POA: Diagnosis not present

## 2022-11-12 ENCOUNTER — Telehealth: Payer: Self-pay | Admitting: Hematology

## 2022-11-12 NOTE — Telephone Encounter (Signed)
Patient is aware of appointment being rescheduled.

## 2022-11-20 DIAGNOSIS — M170 Bilateral primary osteoarthritis of knee: Secondary | ICD-10-CM | POA: Diagnosis not present

## 2022-12-11 ENCOUNTER — Ambulatory Visit: Payer: PPO | Admitting: Hematology

## 2022-12-11 ENCOUNTER — Other Ambulatory Visit: Payer: PPO

## 2022-12-13 ENCOUNTER — Other Ambulatory Visit: Payer: Self-pay | Admitting: Hematology

## 2023-01-04 NOTE — Progress Notes (Unsigned)
Patient Care Team: Lupita Raider, MD as PCP - General (Family Medicine)   CHIEF COMPLAINT: Follow up essential thrombocythemia/ET  Oncology History  Essential thrombocythemia [facility])  08/13/2014 - 08/14/2014 Hospital Admission   Patient was admitted for acute onset headache and aphasia, spontaneously resolved quickly. Probable TIA versus migraine.   10/17/2014 Miscellaneous   JAK2 V617F mutation (+),    10/24/2014 Initial Diagnosis   Essential thrombocythemia. Pt presented with thrombocytosis with platelet [phone] since 2013.    10/24/2014 Bone Marrow Biopsy   Hypercellular marrow with increased and atypical megakaryocytes. No significant fibrosis. Myeloid and erythroid elements are not overly increased. The overall findings are suggestive of a myeloproliferative neoplasm, especially essential thrombocythemia.    11/06/2014 -  Chemotherapy   Anagrelide 0.5 mg twice daily      CURRENT THERAPY: Anagrelide: 1 mg AM/ 0.5 mg PM daily  INTERVAL HISTORY Yvonne Rose returns for follow up as scheduled. Last seen by my colleague Cassie, PA 06/12/22  ROS   Past Medical History:  Diagnosis Date   Anxiety    Hypertension    PONV (postoperative nausea and vomiting)    TIA (transient ischemic attack)      Past Surgical History:  Procedure Laterality Date   ABDOMINAL HYSTERECTOMY     BACK SURGERY     DILATION AND CURETTAGE OF UTERUS       Outpatient Encounter Medications as of 01/06/2023  Medication Sig   acetaminophen (TYLENOL) 500 MG tablet Take 500 mg by mouth every 8 (eight) hours as needed for mild pain.    anagrelide (AGRYLIN) 0.5 MG capsule Take 1 capsule by mouth daily after dinner in addition to 1 mg daily in the morning.   anagrelide (AGRYLIN) 1 MG capsule Take 1 capsule by mouth once daily.   aspirin 81 MG EC tablet Adult Low Dose Aspirin 81 mg tablet,delayed release   hydrochlorothiazide (HYDRODIURIL) 25 MG tablet Take 25 mg by mouth daily.   losartan (COZAAR) 100  MG tablet Take 150 mg by mouth daily.   metoprolol succinate (TOPROL-XL) 100 MG 24 hr tablet 150 mg daily.   omeprazole (PRILOSEC) 20 MG capsule Take 20 mg by mouth daily.   pravastatin (PRAVACHOL) 80 MG tablet Take 1 tablet (80 mg total) by mouth daily.   No facility-administered encounter medications on file as of 01/06/2023.     There were no vitals filed for this visit. There is no height or weight on file to calculate BMI.   PHYSICAL EXAM GENERAL:alert, no distress and comfortable SKIN: no rash  EYES: sclera clear NECK: without mass LYMPH:  no palpable cervical or supraclavicular lymphadenopathy  LUNGS: clear with normal breathing effort HEART: regular rate & rhythm, no lower extremity edema ABDOMEN: abdomen soft, non-tender and normal bowel sounds NEURO: alert & oriented x 3 with fluent speech, no focal motor/sensory deficits Breast exam:  PAC without erythema    CBC    Component Value Date/Time   WBC 11.1 (H) 06/12/2022 0839   WBC 10.2 06/29/2020 0952   RBC 3.80 (L) 06/12/2022 0839   RBC 3.83 (L) 06/12/2022 0839   HGB 11.8 (L) 06/12/2022 0839   HGB 11.8 04/22/2017 1243   HCT 36.8 06/12/2022 0839   HCT 36.8 04/22/2017 1243   PLT 307 06/12/2022 0839   PLT 288 04/22/2017 1243   MCV 96.1 06/12/2022 0839   MCV 96.3 04/22/2017 1243   MCH 30.8 06/12/2022 0839   MCHC 32.1 06/12/2022 0839   RDW 15.0 06/12/2022 0839  RDW 14.2 04/22/2017 1243   LYMPHSABS 1.7 06/12/2022 0839   LYMPHSABS 2.3 04/22/2017 1243   MONOABS 0.8 06/12/2022 0839   MONOABS 0.7 04/22/2017 1243   EOSABS 0.4 06/12/2022 0839   EOSABS 0.2 04/22/2017 1243   BASOSABS 0.1 06/12/2022 0839   BASOSABS 0.0 04/22/2017 1243     CMP     Component Value Date/Time   NA 137 06/12/2022 0839   NA 141 04/22/2017 1243   K 4.2 06/12/2022 0839   K 4.2 04/22/2017 1243   CL 102 06/12/2022 0839   CO2 27 06/12/2022 0839   CO2 26 04/22/2017 1243   GLUCOSE 114 (H) 06/12/2022 0839   GLUCOSE 104 04/22/2017 1243    BUN 24 (H) 06/12/2022 0839   BUN 16.1 04/22/2017 1243   CREATININE 1.15 (H) 06/12/2022 0839   CREATININE 1.19 (H) 02/25/2021 0901   CREATININE 0.8 04/22/2017 1243   CALCIUM 10.0 06/12/2022 0839   CALCIUM 10.0 04/22/2017 1243   PROT 7.4 06/12/2022 0839   PROT 7.6 04/22/2017 1243   ALBUMIN 4.2 06/12/2022 0839   ALBUMIN 3.9 04/22/2017 1243   AST 16 06/12/2022 0839   AST 19 02/25/2021 0901   AST 26 04/22/2017 1243   ALT 9 06/12/2022 0839   ALT 14 02/25/2021 0901   ALT 18 04/22/2017 1243   ALKPHOS 80 06/12/2022 0839   ALKPHOS 76 04/22/2017 1243   BILITOT 0.4 06/12/2022 0839   BILITOT 0.4 02/25/2021 0901   BILITOT 0.24 04/22/2017 1243   GFRNONAA 49 (L) 06/12/2022 0839   GFRNONAA 47 (L) 02/25/2021 0901   GFRAA 56 (L) 01/03/2020 2023     ASSESSMENT & PLAN:Yvonne Rose is a 77 y.o. adult with    1. Essential thrombocythemia, JAK2 V617F (+) -Her platelet count has been slowly trending up since 2013. The rest of her CBC are normal. She does not appear to have obvious cause for reactive thrombocytosis, such as iron deficient anemia, chronic infection or chronic inflammation. -Her JAK2 mutation and bone marrow biopsy results are consistent with essential thrombocythemia. -Pt previously declined Hydrea due to the concern of side effects. I started her on Anagrelide 1mg  in 10/2014. Has been tolerating anagrelide very well  -last echo in July 2021 was unremarkable.  Patient is aware of potential cardiac toxicity from anagrelide -She is currently on anagrelide to 1 mg in the morning and 0.5 mg in the evening, tolerating well.   2. Mild Anemia, Leukocytosis -hgb fluctuates 11-12.5, and WBC 9-11.5   2. HTN, CKD stage 3, and anxiety -f/up PCP   3. Osteopenia -DEXA 12/28/20 showed osteopenia (T-score -2.0) -on calcium/Vit D supplement       PLAN:  No orders of the defined types were placed in this encounter.     All questions were answered. The patient knows to call the clinic with  any problems, questions or concerns. No barriers to learning were detected. I spent *** counseling the patient face to face. The total time spent in the appointment was *** and more than 50% was on counseling, review of test results, and coordination of care.   Santiago Glad, NP-C @DATE @

## 2023-01-05 ENCOUNTER — Other Ambulatory Visit: Payer: Self-pay

## 2023-01-05 DIAGNOSIS — D473 Essential (hemorrhagic) thrombocythemia: Secondary | ICD-10-CM

## 2023-01-06 ENCOUNTER — Emergency Department (HOSPITAL_COMMUNITY): Payer: PPO

## 2023-01-06 ENCOUNTER — Other Ambulatory Visit: Payer: Self-pay

## 2023-01-06 ENCOUNTER — Encounter: Payer: Self-pay | Admitting: Nurse Practitioner

## 2023-01-06 ENCOUNTER — Encounter (HOSPITAL_COMMUNITY): Payer: Self-pay | Admitting: Radiology

## 2023-01-06 ENCOUNTER — Inpatient Hospital Stay (HOSPITAL_BASED_OUTPATIENT_CLINIC_OR_DEPARTMENT_OTHER): Payer: PPO | Admitting: Nurse Practitioner

## 2023-01-06 ENCOUNTER — Emergency Department (HOSPITAL_COMMUNITY)
Admission: EM | Admit: 2023-01-06 | Discharge: 2023-01-06 | Disposition: A | Discharge status: Home / Self Care | Payer: PPO | Attending: Emergency Medicine | Admitting: Emergency Medicine

## 2023-01-06 ENCOUNTER — Inpatient Hospital Stay: Payer: PPO | Attending: Nurse Practitioner

## 2023-01-06 VITALS — BP 160/80 | HR 60 | Temp 98.2°F | Resp 13 | Wt 154.9 lb

## 2023-01-06 DIAGNOSIS — D72829 Elevated white blood cell count, unspecified: Secondary | ICD-10-CM | POA: Diagnosis not present

## 2023-01-06 DIAGNOSIS — R9082 White matter disease, unspecified: Secondary | ICD-10-CM | POA: Diagnosis not present

## 2023-01-06 DIAGNOSIS — D473 Essential (hemorrhagic) thrombocythemia: Secondary | ICD-10-CM

## 2023-01-06 DIAGNOSIS — I10 Essential (primary) hypertension: Secondary | ICD-10-CM | POA: Diagnosis not present

## 2023-01-06 DIAGNOSIS — R519 Headache, unspecified: Secondary | ICD-10-CM | POA: Diagnosis not present

## 2023-01-06 DIAGNOSIS — R4701 Aphasia: Secondary | ICD-10-CM | POA: Diagnosis not present

## 2023-01-06 DIAGNOSIS — R29818 Other symptoms and signs involving the nervous system: Secondary | ICD-10-CM | POA: Diagnosis not present

## 2023-01-06 DIAGNOSIS — G459 Transient cerebral ischemic attack, unspecified: Secondary | ICD-10-CM

## 2023-01-06 DIAGNOSIS — Z79899 Other long term (current) drug therapy: Secondary | ICD-10-CM | POA: Insufficient documentation

## 2023-01-06 DIAGNOSIS — Z7982 Long term (current) use of aspirin: Secondary | ICD-10-CM | POA: Diagnosis not present

## 2023-01-06 DIAGNOSIS — I6782 Cerebral ischemia: Secondary | ICD-10-CM | POA: Diagnosis not present

## 2023-01-06 LAB — CBC WITH DIFFERENTIAL (CANCER CENTER ONLY)
Abs Immature Granulocytes: 0.17 10*3/uL — ABNORMAL HIGH (ref 0.00–0.07)
Basophils Absolute: 0.1 10*3/uL (ref 0.0–0.1)
Basophils Relative: 1 %
Eosinophils Absolute: 0.3 10*3/uL (ref 0.0–0.5)
Eosinophils Relative: 3 %
HCT: 34.8 % — ABNORMAL LOW (ref 36.0–46.0)
Hemoglobin: 11.3 g/dL — ABNORMAL LOW (ref 12.0–15.0)
Immature Granulocytes: 1 %
Lymphocytes Relative: 17 %
Lymphs Abs: 2.3 10*3/uL (ref 0.7–4.0)
MCH: 30.3 pg (ref 26.0–34.0)
MCHC: 32.5 g/dL (ref 30.0–36.0)
MCV: 93.3 fL (ref 80.0–100.0)
Monocytes Absolute: 1 10*3/uL (ref 0.1–1.0)
Monocytes Relative: 8 %
Neutro Abs: 9.6 10*3/uL — ABNORMAL HIGH (ref 1.7–7.7)
Neutrophils Relative %: 70 %
Platelet Count: 393 10*3/uL (ref 150–400)
RBC: 3.73 MIL/uL — ABNORMAL LOW (ref 3.87–5.11)
RDW: 17.8 % — ABNORMAL HIGH (ref 11.5–15.5)
WBC Count: 13.6 10*3/uL — ABNORMAL HIGH (ref 4.0–10.5)
nRBC: 0 % (ref 0.0–0.2)

## 2023-01-06 LAB — COMPREHENSIVE METABOLIC PANEL
ALT: 17 U/L (ref 0–44)
AST: 20 U/L (ref 15–41)
Albumin: 3.9 g/dL (ref 3.5–5.0)
Alkaline Phosphatase: 75 U/L (ref 38–126)
Anion gap: 8 (ref 5–15)
BUN: 24 mg/dL — ABNORMAL HIGH (ref 8–23)
CO2: 26 mmol/L (ref 22–32)
Calcium: 9.9 mg/dL (ref 8.9–10.3)
Chloride: 104 mmol/L (ref 98–111)
Creatinine, Ser: 1.18 mg/dL — ABNORMAL HIGH (ref 0.44–1.00)
GFR, Estimated: 47 mL/min — ABNORMAL LOW (ref >60)
Glucose, Bld: 94 mg/dL (ref 70–99)
Potassium: 4.1 mmol/L (ref 3.5–5.1)
Sodium: 138 mmol/L (ref 135–145)
Total Bilirubin: 0.8 mg/dL (ref 0.3–1.2)
Total Protein: 7.5 g/dL (ref 6.5–8.1)

## 2023-01-06 LAB — CBC
HCT: 37.5 % (ref 36.0–46.0)
Hemoglobin: 11.9 g/dL — ABNORMAL LOW (ref 12.0–15.0)
MCH: 30.4 pg (ref 26.0–34.0)
MCHC: 31.7 g/dL (ref 30.0–36.0)
MCV: 95.9 fL (ref 80.0–100.0)
Platelets: 379 10*3/uL (ref 150–400)
RBC: 3.91 MIL/uL (ref 3.87–5.11)
RDW: 17.8 % — ABNORMAL HIGH (ref 11.5–15.5)
WBC: 14.2 10*3/uL — ABNORMAL HIGH (ref 4.0–10.5)
nRBC: 0 % (ref 0.0–0.2)

## 2023-01-06 LAB — PROTIME-INR
INR: 1 (ref 0.8–1.2)
Prothrombin Time: 13.1 seconds (ref 11.4–15.2)

## 2023-01-06 LAB — I-STAT CHEM 8, ED
BUN: 22 mg/dL (ref 8–23)
Calcium, Ion: 1.36 mmol/L (ref 1.15–1.40)
Chloride: 106 mmol/L (ref 98–111)
Creatinine, Ser: 1.2 mg/dL — ABNORMAL HIGH (ref 0.44–1.00)
Glucose, Bld: 88 mg/dL (ref 70–99)
HCT: 35 % — ABNORMAL LOW (ref 36.0–46.0)
Hemoglobin: 11.9 g/dL — ABNORMAL LOW (ref 12.0–15.0)
Potassium: 4.5 mmol/L (ref 3.5–5.1)
Sodium: 139 mmol/L (ref 135–145)
TCO2: 25 mmol/L (ref 22–32)

## 2023-01-06 LAB — DIFFERENTIAL
Abs Immature Granulocytes: 0.1 10*3/uL — ABNORMAL HIGH (ref 0.00–0.07)
Basophils Absolute: 0.1 10*3/uL (ref 0.0–0.1)
Basophils Relative: 1 %
Eosinophils Absolute: 0.3 10*3/uL (ref 0.0–0.5)
Eosinophils Relative: 2 %
Immature Granulocytes: 1 %
Lymphocytes Relative: 16 %
Lymphs Abs: 2.3 10*3/uL (ref 0.7–4.0)
Monocytes Absolute: 1 10*3/uL (ref 0.1–1.0)
Monocytes Relative: 7 %
Neutro Abs: 10.4 10*3/uL — ABNORMAL HIGH (ref 1.7–7.7)
Neutrophils Relative %: 73 %

## 2023-01-06 LAB — RETIC PANEL
Immature Retic Fract: 14.7 % (ref 2.3–15.9)
RBC.: 3.63 MIL/uL — ABNORMAL LOW (ref 3.87–5.11)
Retic Count, Absolute: 74.8 10*3/uL (ref 19.0–186.0)
Retic Ct Pct: 2.1 % (ref 0.4–3.1)
Reticulocyte Hemoglobin: 34 pg (ref >27.9)

## 2023-01-06 LAB — CMP (CANCER CENTER ONLY)
ALT: 11 U/L (ref 0–44)
AST: 16 U/L (ref 15–41)
Albumin: 4 g/dL (ref 3.5–5.0)
Alkaline Phosphatase: 69 U/L (ref 38–126)
Anion gap: 6 (ref 5–15)
BUN: 22 mg/dL (ref 8–23)
CO2: 27 mmol/L (ref 22–32)
Calcium: 10.1 mg/dL (ref 8.9–10.3)
Chloride: 106 mmol/L (ref 98–111)
Creatinine: 1.19 mg/dL — ABNORMAL HIGH (ref 0.44–1.00)
GFR, Estimated: 47 mL/min — ABNORMAL LOW (ref >60)
Glucose, Bld: 83 mg/dL (ref 70–99)
Potassium: 4.7 mmol/L (ref 3.5–5.1)
Sodium: 139 mmol/L (ref 135–145)
Total Bilirubin: 0.4 mg/dL (ref 0.3–1.2)
Total Protein: 6.5 g/dL (ref 6.5–8.1)

## 2023-01-06 LAB — APTT: aPTT: 28 seconds (ref 24–36)

## 2023-01-06 MED ORDER — SODIUM CHLORIDE 0.9 % IV BOLUS
500.0000 mL | Freq: Once | INTRAVENOUS | Status: AC
  Administered 2023-01-06: 500 mL via INTRAVENOUS

## 2023-01-06 MED ORDER — SODIUM CHLORIDE 0.9 % IV SOLN: 100.0000 mL/h | INTRAVENOUS | Status: DC

## 2023-01-06 MED ORDER — LOSARTAN POTASSIUM 25 MG PO TABS
50.0000 mg | ORAL_TABLET | Freq: Once | ORAL | Status: AC
  Administered 2023-01-06: 50 mg via ORAL
  Filled 2023-01-06: qty 2

## 2023-01-06 MED ORDER — IOHEXOL 350 MG/ML SOLN
75.0000 mL | Freq: Once | INTRAVENOUS | Status: AC | PRN
  Administered 2023-01-06: 100 mL via INTRAVENOUS

## 2023-01-06 MED ORDER — ACETAMINOPHEN 325 MG PO TABS
650.0000 mg | ORAL_TABLET | Freq: Once | ORAL | Status: AC
  Administered 2023-01-06: 650 mg via ORAL
  Filled 2023-01-06: qty 2

## 2023-01-06 MED ORDER — SODIUM CHLORIDE (PF) 0.9 % IJ SOLN
INTRAMUSCULAR | Status: AC
  Filled 2023-01-06: qty 50

## 2023-01-06 MED ORDER — ASPIRIN 81 MG PO CHEW
324.0000 mg | CHEWABLE_TABLET | Freq: Once | ORAL | Status: AC
  Administered 2023-01-06: 324 mg via ORAL
  Filled 2023-01-06: qty 4

## 2023-01-06 NOTE — ED Triage Notes (Signed)
Pt from [facility], described to PA that she had an expressive aphasia event which cleared in 15 minutes after this morning. Hx of TIA. Sent for neuro eval. Pt a&o x 4 on arrival. Initial NIH at bedside 0.

## 2023-01-06 NOTE — Discharge Instructions (Addendum)
Continue a full dose aspirin daily.  Follow-up with the neurologist for further evaluation as we discussed.  Return to the ER for recurrent symptoms

## 2023-01-06 NOTE — Progress Notes (Signed)
Patient came in for her appointment today. When the tech took her vital her bp was very high 191/97. When I went in to see the patient I rechecked the bp and it was 160/80. The patient stated she had a headache this morning, she also stated she had an episode of aphasia this morning that lasted for about 15 to 20 min. Made Santiago Glad NP aware of what the patient reported.

## 2023-01-06 NOTE — ED Provider Notes (Signed)
[place] EMERGENCY DEPARTMENT AT [facility] Provider Note   CSN: [id number] Arrival date & time: 01/06/23  4098     History  Chief Complaint  Patient presents with   Expressive Aphasia    Yvonne Rose is a 77 y.o. adult.  HPI   Pt has history of htn, tia and essential thrombocytopenia.  Patient states last evening she felt fine.  This morning when she woke up she noted she was having some difficulty with her speech.  She felt like she was having some trouble finding words.  It was similar to when she had a TIA in the past.  This lasted for about 15 minutes and then resolved.  Patient states she went to the cancer center this morning.  They noted her blood pressure was elevated and they recommended she come to the ED for evaluation.  Patient states she has a mild headache now but otherwise is not having any trouble with weakness, difficulty with her speech, vision changes, balance or coordination issues.  She denies any fevers or chills.  No other complaints.  Home Medications Prior to Admission medications   Medication Sig Start Date End Date Taking? Authorizing Provider  acetaminophen (TYLENOL) 500 MG tablet Take 500 mg by mouth every 8 (eight) hours as needed for mild pain.     [provider]  anagrelide (AGRYLIN) 0.5 MG capsule Take 1 capsule by mouth daily after dinner in addition to 1 mg daily in the morning. 12/15/22   Malachy Mood, MD  anagrelide (AGRYLIN) 1 MG capsule Take 1 capsule by mouth once daily. 08/07/22   Heilingoetter, Cassandra L, PA-C  aspirin 81 MG EC tablet Adult Low Dose Aspirin 81 mg tablet,delayed release    [provider]  hydrochlorothiazide (HYDRODIURIL) 25 MG tablet Take 25 mg by mouth daily.    [provider]  losartan (COZAAR) 100 MG tablet Take 50 mg by mouth daily.    [provider]  metoprolol succinate (TOPROL-XL) 100 MG 24 hr tablet 200 mg daily.    [provider]  omeprazole (PRILOSEC) 20  MG capsule Take 20 mg by mouth daily.    [provider]  pravastatin (PRAVACHOL) 80 MG tablet Take 1 tablet (80 mg total) by mouth daily. 08/15/14   Meredeth Ide, MD      Allergies    Propoxyphene and Sulfa antibiotics    Review of Systems   Review of Systems  Physical Exam Updated Vital Signs BP (!) 165/77 (BP Location: Right Arm)   Pulse 68   Temp 97.7 F (36.5 C) (Oral)   Resp 16   SpO2 97%  Physical Exam Vitals and nursing note reviewed.  Constitutional:      General: Yvonne Rose is not in acute distress.    Appearance: Yvonne Rose is well-developed.  HENT:     Head: Normocephalic and atraumatic.     Right Ear: External ear normal.     Left Ear: External ear normal.  Eyes:     General: No visual field deficit or scleral icterus.       Right eye: No discharge.        Left eye: No discharge.     Conjunctiva/sclera: Conjunctivae normal.  Neck:     Trachea: No tracheal deviation.  Cardiovascular:     Rate and Rhythm: Normal rate and regular rhythm.  Pulmonary:     Effort: Pulmonary effort is normal. No respiratory distress.     Breath sounds: Normal breath sounds.  No stridor. No wheezing or rales.  Abdominal:     General: Bowel sounds are normal. There is no distension.     Palpations: Abdomen is soft.     Tenderness: There is no abdominal tenderness. There is no guarding or rebound.  Musculoskeletal:        General: No tenderness.     Cervical back: Neck supple.  Skin:    General: Skin is warm and dry.     Findings: No rash.  Neurological:     Mental Status: Yvonne Rose is alert and oriented to person, place, and time.     Cranial Nerves: No cranial nerve deficit, dysarthria or facial asymmetry.     Sensory: No sensory deficit.     Motor: No abnormal muscle tone, seizure activity or pronator drift.     Coordination: Coordination normal.     Comments:  able to hold both legs off bed for 5 seconds, sensation intact in all extremities,  no left or right sided  neglect, normal finger-nose exam bilaterally, no nystagmus noted   Psychiatric:        Mood and Affect: Mood normal.     ED Results / Procedures / Treatments   Labs (all labs ordered are listed, but only abnormal results are displayed) Labs Reviewed  CBC - Abnormal; Notable for the following components:      Result Value   WBC 14.2 (*)    Hemoglobin 11.9 (*)    RDW 17.8 (*)    All other components within normal limits  DIFFERENTIAL - Abnormal; Notable for the following components:   Neutro Abs 10.4 (*)    Abs Immature Granulocytes 0.10 (*)    All other components within normal limits  COMPREHENSIVE METABOLIC PANEL - Abnormal; Notable for the following components:   BUN 24 (*)    Creatinine, Ser 1.18 (*)    GFR, Estimated 47 (*)    All other components within normal limits  I-STAT CHEM 8, ED - Abnormal; Notable for the following components:   Creatinine, Ser 1.20 (*)    Hemoglobin 11.9 (*)    HCT 35.0 (*)    All other components within normal limits  PROTIME-INR  APTT    EKG EKG Interpretation Date/Time:  Tuesday January 06 2023 09:20:05 EDT Ventricular Rate:  61 PR Interval:  165 QRS Duration:  90 QT Interval:  397 QTC Calculation: 400 R Axis:   60  Text Interpretation: Sinus rhythm Probable left atrial enlargement Nonspecific T abnormalities, anterior leads Since last tracing rate slower Confirmed by Linwood Dibbles [phone]) on 01/06/2023 10:01:00 AM  Radiology CT HEAD WO CONTRAST  Result Date: 01/06/2023 CLINICAL DATA:  77 year old female with a 15 minute episode of expressive aphasia. EXAM: CT HEAD WITHOUT CONTRAST TECHNIQUE: Contiguous axial images were obtained from the base of the skull through the vertex without intravenous contrast. RADIATION DOSE REDUCTION: This exam was performed according to the departmental dose-optimization program which includes automated exposure control, adjustment of the mA and/or kV according to patient size and/or use of iterative  reconstruction technique. COMPARISON:  Brain MRI and head CT 08/13/2014. FINDINGS: Brain: Cerebral volume has not significantly changed since 2016. No midline shift, ventriculomegaly, mass effect, evidence of mass lesion, intracranial hemorrhage or evidence of cortically based acute infarction. Patchy mild to moderate for age bilateral white matter hypodensity has progressed. But otherwise gray-white differentiation is within normal limits. Vascular: No suspicious intracranial vascular hyperdensity. Calcified atherosclerosis at the skull base. Skull: No acute osseous abnormality identified. Sinuses/Orbits:  Visualized paranasal sinuses and mastoids are stable and well aerated. Other: Postoperative changes to both globes since 2016. No acute orbit or scalp soft tissue finding. IMPRESSION: 1. Mild to moderate for age cerebral white matter disease has progressed since 2016, most often due to small vessel ischemia. 2. No acute intracranial abnormality identified. Electronically Signed   By: Odessa Fleming M.D.   On: 01/06/2023 10:41    Procedures Procedures    Medications Ordered in ED Medications  sodium chloride 0.9 % bolus 500 mL (0 mLs Intravenous Stopped 01/06/23 1306)    Followed by  0.9 %  sodium chloride infusion (has no administration in time range)  losartan (COZAAR) tablet 50 mg (has no administration in time range)  aspirin chewable tablet 324 mg (has no administration in time range)  acetaminophen (TYLENOL) tablet 650 mg (650 mg Oral Given 01/06/23 1012)  iohexol (OMNIPAQUE) 350 MG/ML injection 75 mL (100 mLs Intravenous Contrast Given 01/06/23 1234)    ED Course/ Medical Decision Making/ A&P Clinical Course as of 01/06/23 1559  Tue Jan 06, 2023  1117 CBC shows elevated white blood cell count at 14.  Metabolic panel shows creatinine slightly elevated 1.18 [JK]  1219 Head CT without acute.  Discussed the case with Dr. Jerrell Belfast.  Patient would prefer to go home if possible.  He recommends MRI CT angio  head and neck.  If no signs of stroke patient can [JK]  1219  follow-up outpatient with neurology [JK]  1549 CT angio does not show any evidence of stenosis.  Questionable right ICA dilation. [JK]  1551 MRI does not show any evidence of acute abnormality.  Prior infarcts noted [JK]  1557 Patient's blood pressures down 120s at the bedside [JK]    Clinical Course User Index [JK] Linwood Dibbles, MD       ABCD2 Score: 4                     Medical Decision Making Amount and/or Complexity of Data Reviewed Labs: ordered. Radiology: ordered.  Risk OTC drugs. Prescription drug management.   Patient presented to ED for evaluation of an episode of speech difficulty.  This lasted for about 15 minutes.  Patient was also initially noted to be significantly hypertensive.  Patient blood pressure is improved in the ED.  She does not have any focal deficits.  Presentation is concerning for possible TIA.  She does have a moderate risk ABCD2 score.  Case was discussed with Dr. Jerrell Belfast.  Plan was for MRI in the ED and CT angio of head and neck.  Fortunately no evidence of severe stenosis, obstruction.  MRI does not show evidence of acute stroke.  Incidental possible aneurysm versus artifact noted.  Discussed these findings with the patient.  Her neurologic exam remains normal and her blood pressure has decreased.  Will have patient follow-up with PCP to review her blood pressure.  Outpatient follow-up with neurology.       Final Clinical Impression(s) / ED Diagnoses Final diagnoses:  TIA (transient ischemic attack)    Rx / DC Orders ED Discharge Orders          Ordered    Ambulatory referral to Neurology       Comments: An appointment is requested in approximately: 1 week   01/06/23 1558              Linwood Dibbles, MD 01/06/23 1559

## 2023-01-07 ENCOUNTER — Telehealth: Payer: Self-pay | Admitting: Nurse Practitioner

## 2023-01-19 DIAGNOSIS — M25561 Pain in right knee: Secondary | ICD-10-CM | POA: Diagnosis not present

## 2023-01-27 ENCOUNTER — Encounter: Payer: Self-pay | Admitting: Nurse Practitioner

## 2023-01-27 ENCOUNTER — Inpatient Hospital Stay: Payer: PPO | Attending: Nurse Practitioner | Admitting: Nurse Practitioner

## 2023-01-27 DIAGNOSIS — D473 Essential (hemorrhagic) thrombocythemia: Secondary | ICD-10-CM

## 2023-01-27 NOTE — Progress Notes (Signed)
Patient Care Team: Lupita Raider, MD as PCP - General (Family Medicine)    I connected with Yvonne Rose on 01/27/23 at  8:30 AM EDT by telephone visit and verified that I am speaking with the correct person using two identifiers.   I discussed the limitations, risks, security and privacy concerns of performing an evaluation and management service by telemedicine and the availability of in-person appointments. I also discussed with the patient that there may be a patient responsible charge related to this service. The patient expressed understanding and agreed to proceed.   Other persons participating in the visit and their role in the encounter: none   Patient's location: home  Provider's location: [facility] office   CHIEF COMPLAINT: Follow-up ?TIA in the setting of ET  Oncology History  Essential thrombocythemia [facility])  08/13/2014 - 08/14/2014 Hospital Admission   Patient was admitted for acute onset headache and aphasia, spontaneously resolved quickly. Probable TIA versus migraine.   10/17/2014 Miscellaneous   JAK2 V617F mutation (+),    10/24/2014 Initial Diagnosis   Essential thrombocythemia. Pt presented with thrombocytosis with platelet [phone] since 2013.    10/24/2014 Bone Marrow Biopsy   Hypercellular marrow with increased and atypical megakaryocytes. No significant fibrosis. Myeloid and erythroid elements are not overly increased. The overall findings are suggestive of a myeloproliferative neoplasm, especially essential thrombocythemia.    11/06/2014 -  Chemotherapy   Anagrelide 0.5 mg twice daily      CURRENT THERAPY: Anagrelide 1 mg a.m./0.5 mg p.m. daily, since 10/2014  INTERVAL HISTORY Yvonne Rose presents for phone visit as scheduled, last seen by me in the office 01/06/2023 for routine follow-up ET.  Earlier that morning she had an aphasic episode at home, similar to TIA in 2016, but appeared normal at the visit.  Platelets were normal.  She was sent to ED to  rule out stroke, workup showed no acute abnormality.  She has been doing well at home with no recurrent episodes.  Blood pressures have been controlled at home.  She continues anagrelide at same dose, tolerating well.  She has some bruising but no bleeding. She is hydrating well.  ROS  All other systems reviewed and negative  Past Medical History:  Diagnosis Date   Anxiety    Hypertension    PONV (postoperative nausea and vomiting)    TIA (transient ischemic attack)      Past Surgical History:  Procedure Laterality Date   ABDOMINAL HYSTERECTOMY     BACK SURGERY     DILATION AND CURETTAGE OF UTERUS       Outpatient Encounter Medications as of 01/27/2023  Medication Sig   acetaminophen (TYLENOL) 500 MG tablet Take 500 mg by mouth every 8 (eight) hours as needed for mild pain.    anagrelide (AGRYLIN) 0.5 MG capsule Take 1 capsule by mouth daily after dinner in addition to 1 mg daily in the morning.   anagrelide (AGRYLIN) 1 MG capsule Take 1 capsule by mouth once daily.   aspirin 81 MG EC tablet Adult Low Dose Aspirin 81 mg tablet,delayed release   hydrochlorothiazide (HYDRODIURIL) 25 MG tablet Take 25 mg by mouth daily.   losartan (COZAAR) 100 MG tablet Take 50 mg by mouth daily.   metoprolol succinate (TOPROL-XL) 100 MG 24 hr tablet 200 mg daily.   omeprazole (PRILOSEC) 20 MG capsule Take 20 mg by mouth daily.   pravastatin (PRAVACHOL) 80 MG tablet Take 1 tablet (80 mg total) by mouth daily.   No facility-administered  encounter medications on file as of 01/27/2023.     There were no vitals filed for this visit. There is no height or weight on file to calculate BMI.   PHYSICAL EXAM Patient appears well by phone.  Voice is strong, speech is clear.  Mood/affect appear normal for situation.  No cough or conversational dyspnea   LAB DATA No lab data for this     ASSESSMENT & PLAN:Yvonne Rose is a 77 y.o. adult with   ?TIA  -She had TIA with aphasia and arm weakness in  2016, no recurrent episodes -The morning of her last visit 7/23 she had another aphasic episode at home, in clinic she had normal speech, neuro exam nonfocal except headache, and she had elevated BP in clinic -ED eval was negative for acute stroke. She has recovered with no recurrent episodes -We discussed anagrelide does not usually cause stroke, more so from the ET itself but she has been very well controlled, plt count normal.  -Continue Anagrelide same dose 1 mg AM/0.5 mg PM daily -Neuro consult 10/9  2. Essential thrombocythemia, JAK2 V617F (+) -Her platelet count has been slowly trending up since 2013. The rest of her CBC are normal. She does not appear to have obvious cause for reactive thrombocytosis, such as iron deficient anemia, chronic infection or chronic inflammation. -Her JAK2 mutation and bone marrow biopsy results are consistent with essential thrombocythemia. -Pt previously declined Hydrea due to the concern of side effects. I started her on Anagrelide 1mg  in 10/2014. Has been tolerating anagrelide very well  -last echo in July 2021 was unremarkable.  Patient is aware of potential cardiac toxicity from anagrelide -plt stable, continue Anagrelide, f/up 06/2023 as scheduled   3. Mild Anemia, Leukocytosis -hgb fluctuates 11-12.5, and WBC 9-11.5 -Stable overall   4. HTN, CKD stage 3, and anxiety -Stable, f/up PCP    PLAN: -ED work up reviewed -Neuro consult 10/9 as scheduled -Continue Anagrelide 1 mg AM/0.5 mg PM daily -[facility] f/up 07/14/23 as scheduled, or sooner if needed    All questions were answered. The patient knows to call the clinic with any problems, questions or concerns. No barriers to learning were detected. I spent 5 minutes counseling the patient non face to face. The total time spent in the appointment was 10 minutes and more than 50% was on counseling, review of test results, and coordination of care.   Santiago Glad, NP-C 01/27/2023

## 2023-02-12 DIAGNOSIS — E7800 Pure hypercholesterolemia, unspecified: Secondary | ICD-10-CM | POA: Diagnosis not present

## 2023-02-12 DIAGNOSIS — N183 Chronic kidney disease, stage 3 unspecified: Secondary | ICD-10-CM | POA: Diagnosis not present

## 2023-02-12 DIAGNOSIS — R7303 Prediabetes: Secondary | ICD-10-CM | POA: Diagnosis not present

## 2023-02-12 DIAGNOSIS — C4491 Basal cell carcinoma of skin, unspecified: Secondary | ICD-10-CM | POA: Diagnosis not present

## 2023-02-12 DIAGNOSIS — I129 Hypertensive chronic kidney disease with stage 1 through stage 4 chronic kidney disease, or unspecified chronic kidney disease: Secondary | ICD-10-CM | POA: Diagnosis not present

## 2023-02-26 DIAGNOSIS — R399 Unspecified symptoms and signs involving the genitourinary system: Secondary | ICD-10-CM | POA: Diagnosis not present

## 2023-03-04 DIAGNOSIS — Z23 Encounter for immunization: Secondary | ICD-10-CM | POA: Diagnosis not present

## 2023-03-20 DIAGNOSIS — I129 Hypertensive chronic kidney disease with stage 1 through stage 4 chronic kidney disease, or unspecified chronic kidney disease: Secondary | ICD-10-CM | POA: Diagnosis not present

## 2023-03-25 ENCOUNTER — Encounter: Payer: Self-pay | Admitting: Neurology

## 2023-03-25 ENCOUNTER — Ambulatory Visit: Payer: PPO | Admitting: Neurology

## 2023-03-25 VITALS — BP 157/80 | HR 81 | Ht 64.5 in | Wt 157.0 lb

## 2023-03-25 DIAGNOSIS — I10 Essential (primary) hypertension: Secondary | ICD-10-CM | POA: Diagnosis not present

## 2023-03-25 DIAGNOSIS — G459 Transient cerebral ischemic attack, unspecified: Secondary | ICD-10-CM | POA: Diagnosis not present

## 2023-03-25 NOTE — Progress Notes (Signed)
[facility]  PATIENT: Yvonne Rose DOB: November 25, 1945  REQUESTING CLINICIAN: Linwood Dibbles, MD HISTORY FROM: Patient  REASON FOR VISIT: Episode of aphasia    HISTORICAL  CHIEF COMPLAINT:  Chief Complaint  Patient presents with   New Patient (Initial Visit)    Rm12, alone, NP internal referral for possible TIA: had aphasia on 01/06/23    HISTORY OF PRESENT ILLNESS:  This is a 77 year old woman past medical history of hypertension, hyperlipidemia, essential thrombocytopenia, previous left parietal lobe stroke who is presenting after an episode of aphasia on July 23.  Patient reports that she woke up that morning, was talking to her husband but was not making any sense.  She was aware that her words were not making any sense, she could not understand herself.  She said the episode lasted about 5 minutes.  On that day she did have an appointment with her oncologist, at the office her pressure was elevated to 200 systolic.  She was sent to the ED.  In the ED she had a full TIA workup, negative MRI brain, CTA showing no large vessel occlusion and she was diagnosed with TIA, recommended to increase her aspirin to 325 mg daily.  Since then she has been doing well, has not had any additional episode.   Patient reports 8 years ago she had a similar event but that event was associated with headache and right arm weakness.  She went to the ED, workup was negative did not show any acute stroke but her MRI done this year compared to the one in 2016 showed a chronic left parietal lobe stroke.   OTHER MEDICAL CONDITIONS: Hypertension, hyperlipidemia, essential thrombocytopenia   REVIEW OF SYSTEMS: Full 14 system review of systems performed and negative with exception of: As noted in the HPI   ALLERGIES: Allergies  Allergen Reactions   Propoxyphene    Sulfa Antibiotics     Cant remember    HOME MEDICATIONS: Outpatient Medications Prior to Visit  Medication Sig Dispense  Refill   acetaminophen (TYLENOL) 500 MG tablet Take 500 mg by mouth every 8 (eight) hours as needed for mild pain.      anagrelide (AGRYLIN) 0.5 MG capsule Take 1 capsule by mouth daily after dinner in addition to 1 mg daily in the morning. 90 capsule 0   anagrelide (AGRYLIN) 1 MG capsule Take 1 capsule by mouth once daily. 90 capsule 1   aspirin 81 MG EC tablet Adult Low Dose Aspirin 81 mg tablet,delayed release     hydrochlorothiazide (HYDRODIURIL) 25 MG tablet Take 25 mg by mouth daily.     losartan (COZAAR) 100 MG tablet Take 50 mg by mouth daily.     metoprolol succinate (TOPROL-XL) 100 MG 24 hr tablet 200 mg daily.     omeprazole (PRILOSEC) 20 MG capsule Take 20 mg by mouth daily.     pravastatin (PRAVACHOL) 80 MG tablet Take 1 tablet (80 mg total) by mouth daily. 30 tablet 2   No facility-administered medications prior to visit.    PAST MEDICAL HISTORY: Past Medical History:  Diagnosis Date   Anxiety    Hypertension    PONV (postoperative nausea and vomiting)    TIA (transient ischemic attack)     PAST SURGICAL HISTORY: Past Surgical History:  Procedure Laterality Date   ABDOMINAL HYSTERECTOMY     BACK SURGERY     DILATION AND CURETTAGE OF UTERUS      FAMILY HISTORY: Family History  Problem Relation Age of  Onset   Aneurysm Mother    Hypertension Father    Alcoholism Father    Lupus Sister     SOCIAL HISTORY: Social History   Socioeconomic History   Marital status: Married    Spouse name: Not on file   Number of children: Not on file   Years of education: Not on file   Highest education level: Not on file  Occupational History   Not on file  Tobacco Use   Smoking status: Never   Smokeless tobacco: Never  Substance and Sexual Activity   Alcohol use: Yes    Alcohol/week: 2.0 - 3.0 standard drinks of alcohol    Types: 2 - 3 Cans of beer per week    Comment: daily    Drug use: No   Sexual activity: Yes    Birth control/protection: Post-menopausal  Other  Topics Concern   Not on file  Social History Narrative   Not on file   Social Determinants of Health   Financial Resource Strain: Not on file  Food Insecurity: Not on file  Transportation Needs: Not on file  Physical Activity: Not on file  Stress: Not on file  Social Connections: Not on file  Intimate Partner Violence: Not on file    PHYSICAL EXAM  GENERAL EXAM/CONSTITUTIONAL: Vitals:  Vitals:   03/25/23 0836  BP: (!) 157/80  Pulse: 81  Weight: 157 lb (71.2 kg)  Height: 5' 4.5" (1.638 m)   Body mass index is 26.53 kg/m. Wt Readings from Last 3 Encounters:  03/25/23 157 lb (71.2 kg)  01/06/23 154 lb 14.4 oz (70.3 kg)  06/12/22 157 lb 9.6 oz (71.5 kg)   Patient is in no distress; well developed, nourished and groomed; neck is supple  MUSCULOSKELETAL: Gait, strength, tone, movements noted in Neurologic exam below  NEUROLOGIC: MENTAL STATUS:      No data to display         awake, alert, oriented to person, place and time recent and remote memory intact normal attention and concentration language fluent, comprehension intact, naming intact fund of knowledge appropriate  CRANIAL NERVE:  2nd, 3rd, 4th, 6th - Visual fields full to confrontation, extraocular muscles intact, no nystagmus 5th - facial sensation symmetric 7th - facial strength symmetric 8th - hearing intact 9th - palate elevates symmetrically, uvula midline 11th - shoulder shrug symmetric 12th - tongue protrusion midline  MOTOR:  normal bulk and tone, full strength in the BUE, BLE  SENSORY:  normal and symmetric to light touch  COORDINATION:  finger-nose-finger, fine finger movements normal  GAIT/STATION:  normal     DIAGNOSTIC DATA (LABS, IMAGING, TESTING) - I reviewed patient records, labs, notes, testing and imaging myself where available.  Lab Results  Component Value Date   WBC 14.2 (H) 01/06/2023   HGB 11.9 (L) 01/06/2023   HCT 35.0 (L) 01/06/2023   MCV 95.9 01/06/2023    PLT 379 01/06/2023      Component Value Date/Time   NA 139 01/06/2023 1049   NA 141 04/22/2017 1243   K 4.5 01/06/2023 1049   K 4.2 04/22/2017 1243   CL 106 01/06/2023 1049   CO2 26 01/06/2023 1016   CO2 26 04/22/2017 1243   GLUCOSE 88 01/06/2023 1049   GLUCOSE 104 04/22/2017 1243   BUN 22 01/06/2023 1049   BUN 16.1 04/22/2017 1243   CREATININE 1.20 (H) 01/06/2023 1049   CREATININE 1.19 (H) 01/06/2023 0801   CREATININE 0.8 04/22/2017 1243   CALCIUM 9.9 01/06/2023 1016  CALCIUM 10.0 04/22/2017 1243   PROT 7.5 01/06/2023 1016   PROT 7.6 04/22/2017 1243   ALBUMIN 3.9 01/06/2023 1016   ALBUMIN 3.9 04/22/2017 1243   AST 20 01/06/2023 1016   AST 16 01/06/2023 0801   AST 26 04/22/2017 1243   ALT 17 01/06/2023 1016   ALT 11 01/06/2023 0801   ALT 18 04/22/2017 1243   ALKPHOS 75 01/06/2023 1016   ALKPHOS 76 04/22/2017 1243   BILITOT 0.8 01/06/2023 1016   BILITOT 0.4 01/06/2023 0801   BILITOT 0.24 04/22/2017 1243   GFRNONAA 47 (L) 01/06/2023 1016   GFRNONAA 47 (L) 01/06/2023 0801   GFRAA 56 (L) 01/03/2020 2023   Lab Results  Component Value Date   CHOL 194 08/14/2014   HDL 58 08/14/2014   LDLCALC 116 (H) 08/14/2014   TRIG 100 08/14/2014   CHOLHDL 3.3 08/14/2014   Lab Results  Component Value Date   HGBA1C 5.7 (H) 08/13/2014   No results found for: "VITAMINB12" No results found for: "TSH"  MRI Brain 01/06/2023 1. Mildly motion degraded exam. 2. No evidence of an acute intracranial abnormality. 3. Redemonstrated chronic lacunar infarcts within the left parietal lobe white matter. 4. Background moderate cerebral white matter chronic small vessel ischemic disease, progressed from the prior brain MRI of 08/13/2014. 5. Punctate chronic microhemorrhage within the right temporal lobe. Possible additional chronic microhemorrhages within the basal ganglia, as described. 6. Mild-to-moderate generalized cerebral atrophy.   CTA Head and Neck 01/06/2023 1. No intracranial large  vessel occlusion or significant stenosis. 2. No hemodynamically significant stenosis in the neck. 3. Medially projecting outpouching off the cavernous segment of the right ICA measuring 7 mm, which may represent an aneurysm or dolichoectatic appearance of the intracranial vasculature.    ASSESSMENT AND PLAN  77 y.o. year old adult with history of hypertension, hyperlipidemia, essential thrombocytopenia who is presenting after a episode of aphasia, diagnosed as TIA.  She was recommended aspirin 325 mg daily, advised patient to reduce her aspirin to 81 mg daily and to continue her other medications.  Her blood pressure still elevated despite metoprolol, losartan and hydrochlorothiazide.  I did advise patient to continue following up with PCP for blood pressure optimization.  Continue your other medications, return as needed.   1. TIA (transient ischemic attack)   2. Essential hypertension      Patient Instructions  Decrease aspirin back to 81 mg daily  Continue your other medications  Continue to follow up with PCP, keep a blood pressure diary  Return as needed   No orders of the defined types were placed in this encounter.   No orders of the defined types were placed in this encounter.   Return if symptoms worsen or fail to improve.  I have spent a total of 45 minutes dedicated to this patient today, preparing to see patient, performing a medically appropriate examination and evaluation, ordering tests and/or medications and procedures, and counseling and educating the patient/family/caregiver; independently interpreting result and communicating results to the family/patient/caregiver; and documenting clinical information in the electronic medical record.   Windell Norfolk, MD 03/25/2023, 9:19 AM  [facility] [address], [address] [place], [place] [address] [phone]

## 2023-03-25 NOTE — Patient Instructions (Addendum)
Decrease aspirin back to 81 mg daily  Continue your other medications  Continue to follow up with PCP, keep a blood pressure diary  Return as needed

## 2023-04-04 ENCOUNTER — Other Ambulatory Visit: Payer: Self-pay | Admitting: Hematology

## 2023-04-11 ENCOUNTER — Other Ambulatory Visit: Payer: Self-pay | Admitting: Nurse Practitioner

## 2023-04-11 DIAGNOSIS — D473 Essential (hemorrhagic) thrombocythemia: Secondary | ICD-10-CM

## 2023-04-13 ENCOUNTER — Other Ambulatory Visit: Payer: Self-pay

## 2023-05-19 ENCOUNTER — Encounter: Payer: Self-pay | Admitting: Nurse Practitioner

## 2023-05-19 ENCOUNTER — Other Ambulatory Visit: Payer: Self-pay | Admitting: Nurse Practitioner

## 2023-05-19 DIAGNOSIS — D473 Essential (hemorrhagic) thrombocythemia: Secondary | ICD-10-CM

## 2023-05-19 MED ORDER — ANAGRELIDE HCL 1 MG PO CAPS
ORAL_CAPSULE | ORAL | 1 refills | Status: DC
Start: 2023-05-19 — End: 2023-10-20

## 2023-06-03 DIAGNOSIS — D75839 Thrombocytosis, unspecified: Secondary | ICD-10-CM | POA: Diagnosis not present

## 2023-06-03 DIAGNOSIS — R4701 Aphasia: Secondary | ICD-10-CM | POA: Diagnosis not present

## 2023-06-03 DIAGNOSIS — I129 Hypertensive chronic kidney disease with stage 1 through stage 4 chronic kidney disease, or unspecified chronic kidney disease: Secondary | ICD-10-CM | POA: Diagnosis not present

## 2023-06-03 DIAGNOSIS — Z8673 Personal history of transient ischemic attack (TIA), and cerebral infarction without residual deficits: Secondary | ICD-10-CM | POA: Diagnosis not present

## 2023-07-03 ENCOUNTER — Other Ambulatory Visit: Payer: Self-pay | Admitting: Nurse Practitioner

## 2023-07-14 ENCOUNTER — Inpatient Hospital Stay: Payer: PPO | Admitting: Nurse Practitioner

## 2023-07-14 ENCOUNTER — Inpatient Hospital Stay: Payer: PPO | Attending: Nurse Practitioner

## 2023-07-14 ENCOUNTER — Encounter: Payer: Self-pay | Admitting: Nurse Practitioner

## 2023-07-14 VITALS — BP 190/97 | HR 64 | Temp 97.3°F | Resp 18 | Wt 155.4 lb

## 2023-07-14 DIAGNOSIS — Z7902 Long term (current) use of antithrombotics/antiplatelets: Secondary | ICD-10-CM | POA: Insufficient documentation

## 2023-07-14 DIAGNOSIS — F419 Anxiety disorder, unspecified: Secondary | ICD-10-CM | POA: Insufficient documentation

## 2023-07-14 DIAGNOSIS — D509 Iron deficiency anemia, unspecified: Secondary | ICD-10-CM | POA: Diagnosis not present

## 2023-07-14 DIAGNOSIS — N183 Chronic kidney disease, stage 3 unspecified: Secondary | ICD-10-CM | POA: Insufficient documentation

## 2023-07-14 DIAGNOSIS — D473 Essential (hemorrhagic) thrombocythemia: Secondary | ICD-10-CM

## 2023-07-14 DIAGNOSIS — I129 Hypertensive chronic kidney disease with stage 1 through stage 4 chronic kidney disease, or unspecified chronic kidney disease: Secondary | ICD-10-CM | POA: Insufficient documentation

## 2023-07-14 DIAGNOSIS — Z79899 Other long term (current) drug therapy: Secondary | ICD-10-CM | POA: Insufficient documentation

## 2023-07-14 LAB — CMP (CANCER CENTER ONLY)
ALT: 9 U/L (ref 0–44)
AST: 18 U/L (ref 15–41)
Albumin: 4.1 g/dL (ref 3.5–5.0)
Alkaline Phosphatase: 89 U/L (ref 38–126)
Anion gap: 5 (ref 5–15)
BUN: 25 mg/dL — ABNORMAL HIGH (ref 8–23)
CO2: 29 mmol/L (ref 22–32)
Calcium: 10.3 mg/dL (ref 8.9–10.3)
Chloride: 100 mmol/L (ref 98–111)
Creatinine: 1.37 mg/dL — ABNORMAL HIGH (ref 0.44–1.00)
GFR, Estimated: 39 mL/min — ABNORMAL LOW (ref >60)
Glucose, Bld: 98 mg/dL (ref 70–99)
Potassium: 4.5 mmol/L (ref 3.5–5.1)
Sodium: 134 mmol/L — ABNORMAL LOW (ref 135–145)
Total Bilirubin: 0.3 mg/dL (ref 0.0–1.2)
Total Protein: 7.2 g/dL (ref 6.5–8.1)

## 2023-07-14 LAB — CBC WITH DIFFERENTIAL (CANCER CENTER ONLY)
Abs Immature Granulocytes: 0.07 10*3/uL (ref 0.00–0.07)
Basophils Absolute: 0.1 10*3/uL (ref 0.0–0.1)
Basophils Relative: 1 %
Eosinophils Absolute: 0.4 10*3/uL (ref 0.0–0.5)
Eosinophils Relative: 4 %
HCT: 37.7 % (ref 36.0–46.0)
Hemoglobin: 12.1 g/dL (ref 12.0–15.0)
Immature Granulocytes: 1 %
Lymphocytes Relative: 16 %
Lymphs Abs: 1.7 10*3/uL (ref 0.7–4.0)
MCH: 30 pg (ref 26.0–34.0)
MCHC: 32.1 g/dL (ref 30.0–36.0)
MCV: 93.5 fL (ref 80.0–100.0)
Monocytes Absolute: 0.8 10*3/uL (ref 0.1–1.0)
Monocytes Relative: 7 %
Neutro Abs: 7.9 10*3/uL — ABNORMAL HIGH (ref 1.7–7.7)
Neutrophils Relative %: 71 %
Platelet Count: 369 10*3/uL (ref 150–400)
RBC: 4.03 MIL/uL (ref 3.87–5.11)
RDW: 14.7 % (ref 11.5–15.5)
WBC Count: 11 10*3/uL — ABNORMAL HIGH (ref 4.0–10.5)
nRBC: 0 % (ref 0.0–0.2)

## 2023-07-14 LAB — RETIC PANEL
Immature Retic Fract: 12.5 % (ref 2.3–15.9)
RBC.: 3.99 MIL/uL (ref 3.87–5.11)
Retic Count, Absolute: 52.7 10*3/uL (ref 19.0–186.0)
Retic Ct Pct: 1.3 % (ref 0.4–3.1)
Reticulocyte Hemoglobin: 33.6 pg (ref >27.9)

## 2023-07-14 NOTE — Progress Notes (Signed)
Patient Care Team: Lupita Raider, MD as PCP - General (Family Medicine)   CHIEF COMPLAINT: Follow up essential thrombocythemia  Oncology History  Essential thrombocythemia [facility])  08/13/2014 - 08/14/2014 Hospital Admission   Patient was admitted for acute onset headache and aphasia, spontaneously resolved quickly. Probable TIA versus migraine.   10/17/2014 Miscellaneous   JAK2 V617F mutation (+),    10/24/2014 Initial Diagnosis   Essential thrombocythemia. Pt presented with thrombocytosis with platelet [phone] since 2013.    10/24/2014 Bone Marrow Biopsy   Hypercellular marrow with increased and atypical megakaryocytes. No significant fibrosis. Myeloid and erythroid elements are not overly increased. The overall findings are suggestive of a myeloproliferative neoplasm, especially essential thrombocythemia.    11/06/2014 -  Chemotherapy   Anagrelide 0.5 mg twice daily      CURRENT THERAPY: Anagrelide 1 mg a.m./0.5 mg p.m. daily, since 10/2014   INTERVAL HISTORY Yvonne Rose returns for follow-up as scheduled, last seen by me 01/27/2023 for virtual visit. Has been working to control BP which is elevated here but normal at home. She continues anagrelide, tolerating well overall. Denies any side effects. Denies signs of thombosis, bleeding, or new/specific complaints.   ROS  All other systems reviewed and negative  Past Medical History:  Diagnosis Date   Anxiety    Hypertension    PONV (postoperative nausea and vomiting)    TIA (transient ischemic attack)      Past Surgical History:  Procedure Laterality Date   ABDOMINAL HYSTERECTOMY     BACK SURGERY     DILATION AND CURETTAGE OF UTERUS       Outpatient Encounter Medications as of 07/14/2023  Medication Sig   acetaminophen (TYLENOL) 500 MG tablet Take 500 mg by mouth every 8 (eight) hours as needed for mild pain.    anagrelide (AGRYLIN) 0.5 MG capsule Take 1 capsule by mouth daily after dinner in addition to 1 mg daily in  the morning.   anagrelide (AGRYLIN) 1 MG capsule Take 1 capsule by mouth once daily.   aspirin 81 MG EC tablet Adult Low Dose Aspirin 81 mg tablet,delayed release   hydrochlorothiazide (HYDRODIURIL) 25 MG tablet Take 25 mg by mouth daily.   losartan (COZAAR) 100 MG tablet Take 50 mg by mouth daily.   metoprolol succinate (TOPROL-XL) 100 MG 24 hr tablet 200 mg daily.   omeprazole (PRILOSEC) 20 MG capsule Take 20 mg by mouth daily.   pravastatin (PRAVACHOL) 80 MG tablet Take 1 tablet (80 mg total) by mouth daily.   No facility-administered encounter medications on file as of 07/14/2023.     Today's Vitals   07/14/23 1101 07/14/23 1116 07/14/23 1117  BP: (!) 210/92 (!) 190/97   Pulse: 64    Resp: 18    Temp: (!) 97.3 F (36.3 C)    TempSrc: Temporal    SpO2: 97%    Weight: 155 lb 7 oz (70.5 kg)    PainSc:   0-No pain   Body mass index is 26.27 kg/m.   PHYSICAL EXAM GENERAL:alert, no distress and comfortable SKIN: no rash  EYES: sclera clear LUNGS: clear with normal breathing effort HEART: regular rate & rhythm, no lower extremity edema ABDOMEN: abdomen soft, non-tender and normal bowel sounds NEURO: alert & oriented x 3 with fluent speech, no focal motor/sensory deficits   CBC    Component Value Date/Time   WBC 11.0 (H) 07/14/2023 1027   WBC 14.2 (H) 01/06/2023 1016   RBC 4.03 07/14/2023 1027   RBC  3.99 07/14/2023 1025   HGB 12.1 07/14/2023 1027   HGB 11.8 04/22/2017 1243   HCT 37.7 07/14/2023 1027   HCT 36.8 04/22/2017 1243   PLT 369 07/14/2023 1027   PLT 288 04/22/2017 1243   MCV 93.5 07/14/2023 1027   MCV 96.3 04/22/2017 1243   MCH 30.0 07/14/2023 1027   MCHC 32.1 07/14/2023 1027   RDW 14.7 07/14/2023 1027   RDW 14.2 04/22/2017 1243   LYMPHSABS 1.7 07/14/2023 1027   LYMPHSABS 2.3 04/22/2017 1243   MONOABS 0.8 07/14/2023 1027   MONOABS 0.7 04/22/2017 1243   EOSABS 0.4 07/14/2023 1027   EOSABS 0.2 04/22/2017 1243   BASOSABS 0.1 07/14/2023 1027   BASOSABS  0.0 04/22/2017 1243     CMP     Component Value Date/Time   NA 134 (L) 07/14/2023 1027   NA 141 04/22/2017 1243   K 4.5 07/14/2023 1027   K 4.2 04/22/2017 1243   CL 100 07/14/2023 1027   CO2 29 07/14/2023 1027   CO2 26 04/22/2017 1243   GLUCOSE 98 07/14/2023 1027   GLUCOSE 104 04/22/2017 1243   BUN 25 (H) 07/14/2023 1027   BUN 16.1 04/22/2017 1243   CREATININE 1.37 (H) 07/14/2023 1027   CREATININE 0.8 04/22/2017 1243   CALCIUM 10.3 07/14/2023 1027   CALCIUM 10.0 04/22/2017 1243   PROT 7.2 07/14/2023 1027   PROT 7.6 04/22/2017 1243   ALBUMIN 4.1 07/14/2023 1027   ALBUMIN 3.9 04/22/2017 1243   AST 18 07/14/2023 1027   AST 26 04/22/2017 1243   ALT 9 07/14/2023 1027   ALT 18 04/22/2017 1243   ALKPHOS 89 07/14/2023 1027   ALKPHOS 76 04/22/2017 1243   BILITOT 0.3 07/14/2023 1027   BILITOT 0.24 04/22/2017 1243   GFRNONAA 39 (L) 07/14/2023 1027   GFRAA 56 (L) 01/03/2020 2023     ASSESSMENT & PLAN:Yvonne Rose is a 78 y.o. adult with      Essential thrombocythemia, JAK2 V617F (+) -Her platelet count has been slowly trending up since 2013. The rest of her CBC are normal. She does not appear to have obvious cause for reactive thrombocytosis, such as iron deficient anemia, chronic infection or chronic inflammation. -Her JAK2 mutation and bone marrow biopsy results are consistent with essential thrombocythemia. -Pt previously declined Hydrea due to the concern of side effects. she had a TIA with aphasia and arm weakness in 2016 -She ultimately began Anagrelide in 10/2014, tolerating very well  -last echo in July 2021 was unremarkable.  Patient is aware of potential cardiac toxicity from anagrelide -Yvonne Rose appears stable.  She continues anagrelide, tolerating well without significant side effects.  No signs of thrombosis.  Labs are unremarkable -Continue the current dose of anagrelide 1 mg a.m./0.5 mg p.m. daily -Lab and follow-up in 6 months, or sooner if needed   Mild  Anemia, Leukocytosis -hgb fluctuates 11-12.5, and WBC 9-11.5 -Hgb normal, WBC 11.0 today  -Stable overall   HTN, CKD stage 3, and anxiety -Stable, f/up PCP    PLAN: -Labs reviewed, platelet normal  -Continue the current dose of anagrelide 1 mg a.m./0.5 mg p.m. daily -Lab and follow-up in 6 months, or sooner if needed   All questions were answered. The patient knows to call the clinic with any problems, questions or concerns. No barriers to learning were detected.   Santiago Glad, NP-C 07/14/2023

## 2023-08-19 ENCOUNTER — Other Ambulatory Visit: Payer: Self-pay | Admitting: Family Medicine

## 2023-08-19 DIAGNOSIS — E1122 Type 2 diabetes mellitus with diabetic chronic kidney disease: Secondary | ICD-10-CM | POA: Diagnosis not present

## 2023-08-19 DIAGNOSIS — N183 Chronic kidney disease, stage 3 unspecified: Secondary | ICD-10-CM | POA: Diagnosis not present

## 2023-08-19 DIAGNOSIS — F411 Generalized anxiety disorder: Secondary | ICD-10-CM | POA: Diagnosis not present

## 2023-08-19 DIAGNOSIS — G4700 Insomnia, unspecified: Secondary | ICD-10-CM | POA: Diagnosis not present

## 2023-08-19 DIAGNOSIS — D649 Anemia, unspecified: Secondary | ICD-10-CM | POA: Diagnosis not present

## 2023-08-19 DIAGNOSIS — Z0000 Encounter for general adult medical examination without abnormal findings: Secondary | ICD-10-CM | POA: Diagnosis not present

## 2023-08-19 DIAGNOSIS — D473 Essential (hemorrhagic) thrombocythemia: Secondary | ICD-10-CM | POA: Diagnosis not present

## 2023-08-19 DIAGNOSIS — E7800 Pure hypercholesterolemia, unspecified: Secondary | ICD-10-CM | POA: Diagnosis not present

## 2023-08-19 DIAGNOSIS — I129 Hypertensive chronic kidney disease with stage 1 through stage 4 chronic kidney disease, or unspecified chronic kidney disease: Secondary | ICD-10-CM | POA: Diagnosis not present

## 2023-08-19 DIAGNOSIS — R059 Cough, unspecified: Secondary | ICD-10-CM | POA: Diagnosis not present

## 2023-08-19 DIAGNOSIS — M8580 Other specified disorders of bone density and structure, unspecified site: Secondary | ICD-10-CM

## 2023-08-19 DIAGNOSIS — M899 Disorder of bone, unspecified: Secondary | ICD-10-CM | POA: Diagnosis not present

## 2023-08-19 DIAGNOSIS — Z8673 Personal history of transient ischemic attack (TIA), and cerebral infarction without residual deficits: Secondary | ICD-10-CM | POA: Diagnosis not present

## 2023-08-20 ENCOUNTER — Ambulatory Visit
Admission: RE | Admit: 2023-08-20 | Discharge: 2023-08-20 | Disposition: A | Discharge status: Home / Self Care | Source: Ambulatory Visit | Attending: Family Medicine | Admitting: Family Medicine

## 2023-08-20 ENCOUNTER — Other Ambulatory Visit: Payer: Self-pay | Admitting: Family Medicine

## 2023-08-20 DIAGNOSIS — R051 Acute cough: Secondary | ICD-10-CM

## 2023-08-20 DIAGNOSIS — R059 Cough, unspecified: Secondary | ICD-10-CM | POA: Diagnosis not present

## 2023-09-23 DIAGNOSIS — M1711 Unilateral primary osteoarthritis, right knee: Secondary | ICD-10-CM | POA: Diagnosis not present

## 2023-09-23 DIAGNOSIS — M25561 Pain in right knee: Secondary | ICD-10-CM | POA: Diagnosis not present

## 2023-10-06 DIAGNOSIS — M170 Bilateral primary osteoarthritis of knee: Secondary | ICD-10-CM | POA: Diagnosis not present

## 2023-10-08 ENCOUNTER — Other Ambulatory Visit: Payer: Self-pay | Admitting: Nurse Practitioner

## 2023-10-13 DIAGNOSIS — M170 Bilateral primary osteoarthritis of knee: Secondary | ICD-10-CM | POA: Diagnosis not present

## 2023-10-20 ENCOUNTER — Other Ambulatory Visit: Payer: Self-pay | Admitting: Nurse Practitioner

## 2023-10-20 DIAGNOSIS — M170 Bilateral primary osteoarthritis of knee: Secondary | ICD-10-CM | POA: Diagnosis not present

## 2023-10-20 DIAGNOSIS — D473 Essential (hemorrhagic) thrombocythemia: Secondary | ICD-10-CM

## 2023-11-04 DIAGNOSIS — C44311 Basal cell carcinoma of skin of nose: Secondary | ICD-10-CM | POA: Diagnosis not present

## 2023-12-02 DIAGNOSIS — E1122 Type 2 diabetes mellitus with diabetic chronic kidney disease: Secondary | ICD-10-CM | POA: Diagnosis not present

## 2023-12-02 DIAGNOSIS — I129 Hypertensive chronic kidney disease with stage 1 through stage 4 chronic kidney disease, or unspecified chronic kidney disease: Secondary | ICD-10-CM | POA: Diagnosis not present

## 2023-12-02 DIAGNOSIS — N183 Chronic kidney disease, stage 3 unspecified: Secondary | ICD-10-CM | POA: Diagnosis not present

## 2023-12-07 DIAGNOSIS — C44311 Basal cell carcinoma of skin of nose: Secondary | ICD-10-CM | POA: Diagnosis not present

## 2023-12-09 ENCOUNTER — Telehealth: Payer: Self-pay | Admitting: Radiation Oncology

## 2023-12-09 NOTE — Telephone Encounter (Signed)
 6/25 @ 9:57 am Left voicemail for patient to call our office to be schedule for consult.

## 2023-12-14 DIAGNOSIS — E1122 Type 2 diabetes mellitus with diabetic chronic kidney disease: Secondary | ICD-10-CM | POA: Diagnosis not present

## 2023-12-14 DIAGNOSIS — E7800 Pure hypercholesterolemia, unspecified: Secondary | ICD-10-CM | POA: Diagnosis not present

## 2023-12-14 DIAGNOSIS — N183 Chronic kidney disease, stage 3 unspecified: Secondary | ICD-10-CM | POA: Diagnosis not present

## 2023-12-14 DIAGNOSIS — I129 Hypertensive chronic kidney disease with stage 1 through stage 4 chronic kidney disease, or unspecified chronic kidney disease: Secondary | ICD-10-CM | POA: Diagnosis not present

## 2023-12-15 DIAGNOSIS — M170 Bilateral primary osteoarthritis of knee: Secondary | ICD-10-CM | POA: Diagnosis not present

## 2023-12-25 NOTE — Progress Notes (Signed)
 Radiation Oncology         [phone] ________________________________  Initial Outpatient Consultation  Name: Yvonne Rose MRN: [id number]  Date: 12/28/2023  DOB: 12/31/1945  RR:Dyjt, Joen, MD  Gladis Husband, MD   REFERRING PHYSICIAN: Gladis Husband, MD  DIAGNOSIS: No diagnosis found.   Cancer Staging  No matching staging information was found for the patient.  Basal cell carcinoma (nodular type) of the left nasal ala   CHIEF COMPLAINT: Here to discuss management of skin cancer  HISTORY OF PRESENT ILLNESS::Yvonne Rose is a 78 y.o. adult who initially presented to her dermatologist in March of 2023 with a left nasal alar skin lesion. The lesion was biopsied on 08/20/23 and showed findings consistent with basal cell carcinoma; nodular type.   Excision of the lesion was recommended at that time and she recently returned to her dermatologist on 12/07/23 for re-consideration of Mohs to the [facility] site. Post-operative radiation therapy has been recommended which we will discuss in detail today.   ***   PREVIOUS RADIATION THERAPY: No  PAST MEDICAL HISTORY:  has a past medical history of Anxiety, Hypertension, PONV (postoperative nausea and vomiting), and TIA (transient ischemic attack).    PAST SURGICAL HISTORY: Past Surgical History:  Procedure Laterality Date   ABDOMINAL HYSTERECTOMY     BACK SURGERY     DILATION AND CURETTAGE OF UTERUS      FAMILY HISTORY: family history includes Alcoholism in Antania's father; Aneurysm in Betzy's mother; Hypertension in Lagena's father; Lupus in Deven's sister.  SOCIAL HISTORY:  reports that Monesha has never smoked. Kiira has never used smokeless tobacco. Zayana reports current alcohol use of about 2.0 - 3.0 standard drinks of alcohol per week. Arizbeth reports that Kaia does not use drugs.  ALLERGIES: Propoxyphene and Sulfa antibiotics  MEDICATIONS:  Current Outpatient Medications  Medication Sig Dispense  Refill   acetaminophen  (TYLENOL ) 500 MG tablet Take 500 mg by mouth every 8 (eight) hours as needed for mild pain.      anagrelide  (AGRYLIN) 0.5 MG capsule Take 1 capsule by mouth daily after dinner in addition to 1 mg daily in the morning. 90 capsule 0   anagrelide  (AGRYLIN) 1 MG capsule Take 1 capsule by mouth once daily. 90 capsule 0   aspirin  81 MG EC tablet Adult Low Dose Aspirin  81 mg tablet,delayed release     hydrochlorothiazide (HYDRODIURIL) 25 MG tablet Take 25 mg by mouth daily.     losartan  (COZAAR ) 100 MG tablet Take 50 mg by mouth daily.     metoprolol succinate (TOPROL-XL) 100 MG 24 hr tablet 200 mg daily.     omeprazole (PRILOSEC) 20 MG capsule Take 20 mg by mouth daily.     pravastatin  (PRAVACHOL ) 80 MG tablet Take 1 tablet (80 mg total) by mouth daily. 30 tablet 2   No current facility-administered medications for this encounter.    REVIEW OF SYSTEMS:  Notable for that above.   PHYSICAL EXAM:  vitals were not taken for this visit.   General: Alert and oriented, in no acute distress *** HEENT: Head is normocephalic. Extraocular movements are intact. Oropharynx is clear. Neck: Neck is supple, no palpable cervical or supraclavicular lymphadenopathy. Heart: Regular in rate and rhythm with no murmurs, rubs, or gallops. Chest: Clear to auscultation bilaterally, with no rhonchi, wheezes, or rales. Abdomen: Soft, nontender, nondistended, with no rigidity or guarding. Extremities: No cyanosis or edema. Lymphatics: see Neck Exam Skin: No concerning lesions. Musculoskeletal: symmetric strength and muscle tone throughout.  Neurologic: Cranial nerves II through XII are grossly intact. No obvious focalities. Speech is fluent. Coordination is intact. Psychiatric: Judgment and insight are intact. Affect is appropriate.   ECOG = ***  0 - Asymptomatic (Fully active, able to carry on all predisease activities without restriction)  1 - Symptomatic but completely ambulatory (Restricted  in physically strenuous activity but ambulatory and able to carry out work of a light or sedentary nature. For example, light housework, office work)  2 - Symptomatic, <50% in bed during the day (Ambulatory and capable of all self care but unable to carry out any work activities. Up and about more than 50% of waking hours)  3 - Symptomatic, >50% in bed, but not bedbound (Capable of only limited self-care, confined to bed or chair 50% or more of waking hours)  4 - Bedbound (Completely disabled. Cannot carry on any self-care. Totally confined to bed or chair)  5 - Death   Raylene MM, Creech RH, Tormey DC, et al. [phone]). Toxicity and response criteria of the [facility]. Am. DOROTHA Bridges. Oncol. 5 (6): 649-55   LABORATORY DATA:  Lab Results  Component Value Date   WBC 11.0 (H) 07/14/2023   HGB 12.1 07/14/2023   HCT 37.7 07/14/2023   MCV 93.5 07/14/2023   PLT 369 07/14/2023   CMP     Component Value Date/Time   NA 134 (L) 07/14/2023 1027   NA 141 04/22/2017 1243   K 4.5 07/14/2023 1027   K 4.2 04/22/2017 1243   CL 100 07/14/2023 1027   CO2 29 07/14/2023 1027   CO2 26 04/22/2017 1243   GLUCOSE 98 07/14/2023 1027   GLUCOSE 104 04/22/2017 1243   BUN 25 (H) 07/14/2023 1027   BUN 16.1 04/22/2017 1243   CREATININE 1.37 (H) 07/14/2023 1027   CREATININE 0.8 04/22/2017 1243   CALCIUM 10.3 07/14/2023 1027   CALCIUM 10.0 04/22/2017 1243   PROT 7.2 07/14/2023 1027   PROT 7.6 04/22/2017 1243   ALBUMIN 4.1 07/14/2023 1027   ALBUMIN 3.9 04/22/2017 1243   AST 18 07/14/2023 1027   AST 26 04/22/2017 1243   ALT 9 07/14/2023 1027   ALT 18 04/22/2017 1243   ALKPHOS 89 07/14/2023 1027   ALKPHOS 76 04/22/2017 1243   BILITOT 0.3 07/14/2023 1027   BILITOT 0.24 04/22/2017 1243   EGFR >60 04/22/2017 1243   GFRNONAA 39 (L) 07/14/2023 1027         RADIOGRAPHY: No results found.    IMPRESSION/PLAN:***    On date of service, in total, I spent *** minutes on this  encounter. Patient was seen in person.   __________________________________________   Lauraine Golden, MD  This document serves as a record of services personally performed by Lauraine Golden, MD. It was created on her behalf by Dorthy Fuse, a trained medical scribe. The creation of this record is based on the scribe's personal observations and the provider's statements to them. This document has been checked and approved by the attending provider.

## 2023-12-28 ENCOUNTER — Ambulatory Visit
Admission: RE | Admit: 2023-12-28 | Discharge: 2023-12-28 | Disposition: A | Discharge status: Home / Self Care | Source: Ambulatory Visit | Attending: Radiation Oncology | Admitting: Radiation Oncology

## 2023-12-28 ENCOUNTER — Encounter: Payer: Self-pay | Admitting: Radiation Oncology

## 2023-12-28 VITALS — BP 148/81 | HR 79 | Temp 97.9°F | Resp 18 | Ht 64.5 in | Wt 155.4 lb

## 2023-12-28 DIAGNOSIS — Z8673 Personal history of transient ischemic attack (TIA), and cerebral infarction without residual deficits: Secondary | ICD-10-CM | POA: Insufficient documentation

## 2023-12-28 DIAGNOSIS — I10 Essential (primary) hypertension: Secondary | ICD-10-CM | POA: Diagnosis not present

## 2023-12-28 DIAGNOSIS — C44311 Basal cell carcinoma of skin of nose: Secondary | ICD-10-CM | POA: Insufficient documentation

## 2023-12-28 DIAGNOSIS — Z79899 Other long term (current) drug therapy: Secondary | ICD-10-CM | POA: Insufficient documentation

## 2023-12-28 NOTE — Progress Notes (Signed)
 Head and Neck Cancer Location of Tumor / Histology: Left nasal ala  Patient presented  in March of 2023 months ago with symptoms of: skin lesion, patient had area frozen off in 2023.   Biopsies revealed:    Nutrition Status Yes No Comments  Weight changes? [x]  []  Weight gain due to medications  Swallowing concerns? []  [x]    PEG? []  [x]     Referrals Yes No Comments  Social Work? []  [x]    Dentistry? []  [x]    Swallowing therapy? []  [x]    Nutrition? []  [x]    Med/Onc? [x]  []  See's Dr. Lanny due to thrombocytopenia   Safety Issues Yes No Comments  Prior radiation? []  [x]    Pacemaker/ICD? []  [x]    Possible current pregnancy? []  [x]    Is the patient on methotrexate? []  [x]     Tobacco/Marijuana/Snuff/ETOH use: yes, drinks beer   Past/Anticipated interventions by otolaryngology/ dermatologist, if any: yes   Past/Anticipated interventions by medical oncology, if any:  Not related to nasal basal cell       Current Complaints / other details:   BP (!) 148/81 (BP Location: Left Arm, Patient Position: Sitting, Cuff Size: Large)   Pulse 79   Temp 97.9 F (36.6 C)   Resp 18   Ht 5' 4.5 (1.638 m)   Wt 155 lb 6.4 oz (70.5 kg)   SpO2 94%   BMI 26.26 kg/m

## 2023-12-29 ENCOUNTER — Ambulatory Visit
Admission: RE | Admit: 2023-12-29 | Discharge: 2023-12-29 | Disposition: A | Discharge status: Home / Self Care | Source: Ambulatory Visit | Attending: Radiation Oncology | Admitting: Radiation Oncology

## 2023-12-29 DIAGNOSIS — C44311 Basal cell carcinoma of skin of nose: Secondary | ICD-10-CM | POA: Insufficient documentation

## 2023-12-29 DIAGNOSIS — Z510 Encounter for antineoplastic radiation therapy: Secondary | ICD-10-CM | POA: Insufficient documentation

## 2023-12-31 ENCOUNTER — Other Ambulatory Visit: Payer: Self-pay | Admitting: Hematology

## 2023-12-31 DIAGNOSIS — N183 Chronic kidney disease, stage 3 unspecified: Secondary | ICD-10-CM | POA: Diagnosis not present

## 2023-12-31 DIAGNOSIS — E1122 Type 2 diabetes mellitus with diabetic chronic kidney disease: Secondary | ICD-10-CM | POA: Diagnosis not present

## 2023-12-31 DIAGNOSIS — D473 Essential (hemorrhagic) thrombocythemia: Secondary | ICD-10-CM

## 2023-12-31 DIAGNOSIS — I129 Hypertensive chronic kidney disease with stage 1 through stage 4 chronic kidney disease, or unspecified chronic kidney disease: Secondary | ICD-10-CM | POA: Diagnosis not present

## 2024-01-07 ENCOUNTER — Other Ambulatory Visit: Payer: Self-pay | Admitting: Nurse Practitioner

## 2024-01-08 DIAGNOSIS — Z510 Encounter for antineoplastic radiation therapy: Secondary | ICD-10-CM | POA: Diagnosis not present

## 2024-01-08 DIAGNOSIS — C44311 Basal cell carcinoma of skin of nose: Secondary | ICD-10-CM | POA: Diagnosis not present

## 2024-01-10 ENCOUNTER — Other Ambulatory Visit: Payer: Self-pay | Admitting: Nurse Practitioner

## 2024-01-10 DIAGNOSIS — D473 Essential (hemorrhagic) thrombocythemia: Secondary | ICD-10-CM

## 2024-01-10 NOTE — Assessment & Plan Note (Signed)
,   JAK2 V617F (+) -Her platelet count has been slowly trending up since 2013. The rest of her CBC are normal. She does not appear to have obvious cause for reactive thrombocytosis, such as iron deficient anemia, chronic infection or chronic inflammation. -Her JAK2 mutation and bone marrow biopsy results are consistent with essential thrombocythemia. -Pt previously declined Hydrea due to the concern of side effects. she had a TIA with aphasia and arm weakness in 2016 -She ultimately began Anagrelide  in 10/2014, tolerating very well  -last echo in July 2021 was unremarkable.  Patient is aware of potential cardiac toxicity from anagrelide  -Ms. Enns appears stable.  She continues anagrelide , tolerating well without significant side effects.  No signs of thrombosis.  Labs are unremarkable -Continue the current dose of anagrelide  1 mg a.m./0.5 mg p.m. daily -Lab and follow-up in 6 months, or sooner if needed

## 2024-01-10 NOTE — Progress Notes (Unsigned)
 Patient Care Team: Loreli Kins, MD as PCP - General (Family Medicine)  Clinic Day:  01/11/2024  Referring physician: Loreli Kins, MD  ASSESSMENT & PLAN:   Assessment & Plan: Essential thrombocythemia (HCC) , JAK2 V617F (+) -Her platelet count has been slowly trending up since 2013. The rest of her CBC are normal. She does not appear to have obvious cause for reactive thrombocytosis, such as iron deficient anemia, chronic infection or chronic inflammation. -Her JAK2 mutation and bone marrow biopsy results are consistent with essential thrombocythemia. -Pt previously declined Hydrea due to the concern of side effects. she had a TIA with aphasia and arm weakness in 2016 -She ultimately began Anagrelide  in 10/2014, tolerating very well  -last echo in July 2021 was unremarkable.  Patient is aware of potential cardiac toxicity from anagrelide  -Yvonne Rose appears stable.  She continues anagrelide , tolerating well without significant side effects.  No signs of thrombosis.  Labs are unremarkable -Continue the current dose of anagrelide  1 mg a.m./0.5 mg p.m. daily -Lab and follow-up in 6 months, or sooner if needed   Mild leukocytosis Mild elevation of WBC and ANC. Appear to be around her baseline. No current evidence of infection or inflammation. WBC has been slightly elevated ,up to 14.3 a year ago. Will continue to monitor every 6 months.   Anemia Mild and stable anemia with Hgb 11.2 and Hct 35.5. This has been patient's baseline. She does take oral iron every day. Started this about 2 months ago. Recheck labs in 6 months   CKD Renal functions are stable overall. BUN 21, creatinine 1.26, and GFR 43. She is followed by her PCP for this routinely.   Skin cancer New skin cancer noted on the left side of her nose. Declined MOHS surgery for this. Will start radiation for it tomorrow. Will go for radiation every day for 4 weeks.   Plan Labs reviewed.  -mild and stable anemia and  leukocytosis.  -platelet count normal at 341. -slight elevation of immature reticulocytes at 20.0.  -stable CKD with GFR 43.  Tolerating anagrelide  well.  Continue to take 1 mg in am and 0.5 mg in pm.  Labs and follow up in 6 months, sooner if needed.   The patient understands the plans discussed today and is in agreement with them.  Teresa knows to contact our office if Yvonne Rose develops concerns prior to Yvonne Rose's next appointment.  I provided 20 minutes of face-to-face time during this encounter and > 50% was spent counseling as documented under my assessment and plan.    Powell FORBES Lessen, NP  [facility] - A DEPT OF [facility] [address] [place] [address] Dept: [phone] Dept Fax: [phone]   No orders of the defined types were placed in this encounter.     CHIEF COMPLAINT:  CC: Essential thrombocythemia  Current Treatment: Anagrelide  1 mg in a.m. and 0.5 mg in p.m.  INTERVAL HISTORY:  Yvonne Rose is here today for repeat clinical assessment.  She last saw Lacie, NP on 07/14/2023.  Has been tolerating treatment with anagrelide  well. Her blood pressure is slightly elevated. She states that it is generally elevated when she comes to the Cancer Center. She states that she started taking a liquid iron supplement about 2 months ago. States that she has noted improved fatigue. She does have a new skin cancer on the left side of her nose. She will begin radiation for this tomorrow.  She will have radiation for this every day for 4 weeks. She denies chest pain, chest pressure, or shortness of breath. She denies headaches or visual disturbances. She denies abdominal pain, nausea, vomiting, or changes in bowel or bladder habits.   Yvonne Rose denies fevers or chills. Yvonne Rose denies pain. Yvonne Rose's appetite is good. Yvonne Rose's weight has been stable.  I have reviewed the past medical history, past surgical history, social  history and family history with the patient and they are unchanged from previous note.  ALLERGIES:  is allergic to propoxyphene and sulfa antibiotics.  MEDICATIONS:  Current Outpatient Medications  Medication Sig Dispense Refill   acetaminophen  (TYLENOL ) 500 MG tablet Take 500 mg by mouth every 8 (eight) hours as needed for mild pain.      amLODipine (NORVASC) 5 MG tablet Take 5 mg by mouth daily.     anagrelide  (AGRYLIN) 0.5 MG capsule Take 1 capsule by mouth daily after dinner in addition to 1 mg daily in the morning. 90 capsule 0   anagrelide  (AGRYLIN) 1 MG capsule Take 1 capsule by mouth once daily. 90 capsule 0   aspirin  81 MG EC tablet Adult Low Dose Aspirin  81 mg tablet,delayed release     hydrochlorothiazide (HYDRODIURIL) 25 MG tablet Take 25 mg by mouth daily.     metoprolol succinate (TOPROL-XL) 100 MG 24 hr tablet 200 mg daily.     omeprazole (PRILOSEC) 20 MG capsule Take 20 mg by mouth daily.     pravastatin  (PRAVACHOL ) 80 MG tablet Take 1 tablet (80 mg total) by mouth daily. 30 tablet 2   No current facility-administered medications for this visit.    HISTORY OF PRESENT ILLNESS:   Oncology History  Essential thrombocythemia (HCC)  08/13/2014 - 08/14/2014 Hospital Admission   Patient was admitted for acute onset headache and aphasia, spontaneously resolved quickly. Probable TIA versus migraine.   10/17/2014 Miscellaneous   JAK2 V617F mutation (+),    10/24/2014 Initial Diagnosis   Essential thrombocythemia. Pt presented with thrombocytosis with platelet [phone] since 2013.    10/24/2014 Bone Marrow Biopsy   Hypercellular marrow with increased and atypical megakaryocytes. No significant fibrosis. Myeloid and erythroid elements are not overly increased. The overall findings are suggestive of a myeloproliferative neoplasm, especially essential thrombocythemia.    11/06/2014 -  Chemotherapy   Anagrelide  0.5 mg twice daily   Basal cell carcinoma of left ala nasi   12/28/2023 Initial Diagnosis   Basal cell carcinoma of left ala nasi   12/28/2023 Cancer Staging   Staging form: Cutaneous Carcinoma of the Head and Neck, AJCC 8th Edition - Clinical stage from 12/28/2023: Stage I (cT1, cN0, cM0) - Signed by Izell Domino, MD on 12/28/2023 Extraosseous extension: Absent       REVIEW OF SYSTEMS:   Constitutional: Denies fevers, chills or abnormal weight loss Eyes: Denies blurriness of vision Ears, nose, mouth, throat, and face: Denies mucositis or sore throat Respiratory: Denies cough, dyspnea or wheezes Cardiovascular: Denies palpitation, chest discomfort or lower extremity swelling Gastrointestinal:  Denies nausea, heartburn or change in bowel habits Skin: Denies abnormal skin rashes. New skin lesion on left side of her nose.  Lymphatics: Denies new lymphadenopathy or easy bruising Neurological:Denies numbness, tingling or new weaknesses Behavioral/Psych: Mood is stable, no new changes  All other systems were reviewed with the patient and are negative.   VITALS:   Today's Vitals   01/11/24 0826 01/11/24 0829 01/11/24 0832  BP: (!) 160/80 (!) 140/78   Pulse:  83   Resp:  17   Temp:  97.8 F (36.6 C)   SpO2:  98%   Weight:  156 lb 14.4 oz (71.2 kg)   PainSc:   0-No pain   Body mass index is 26.52 kg/m.   Wt Readings from Last 3 Encounters:  01/11/24 156 lb 14.4 oz (71.2 kg)  12/28/23 155 lb 6.4 oz (70.5 kg)  07/14/23 155 lb 7 oz (70.5 kg)    Body mass index is 26.52 kg/m.  Performance status (ECOG): 1 - Symptomatic but completely ambulatory  PHYSICAL EXAM:   GENERAL:alert, no distress and comfortable SKIN: skin color, texture, turgor are normal, no rashes. She has a small area of skin cancer, just to the left of her nose. Has been diagnosed as skin cancer.  EYES: normal, Conjunctiva are pink and non-injected, sclera clear OROPHARYNX:no exudate, no erythema and lips, buccal mucosa, and tongue normal  NECK: supple, thyroid normal  size, non-tender, without nodularity LYMPH:  no palpable lymphadenopathy in the cervical, axillary or inguinal LUNGS: clear to auscultation and percussion with normal breathing effort HEART: regular rate & rhythm and no murmurs and no lower extremity edema ABDOMEN:abdomen soft, non-tender and normal bowel sounds Musculoskeletal:no cyanosis of digits and no clubbing  NEURO: alert & oriented x 3 with fluent speech, no focal motor/sensory deficits  LABORATORY DATA:  I have reviewed the data as listed    Component Value Date/Time   NA 138 01/11/2024 0742   NA 141 04/22/2017 1243   K 4.1 01/11/2024 0742   K 4.2 04/22/2017 1243   CL 103 01/11/2024 0742   CO2 28 01/11/2024 0742   CO2 26 04/22/2017 1243   GLUCOSE 106 (H) 01/11/2024 0742   GLUCOSE 104 04/22/2017 1243   BUN 21 01/11/2024 0742   BUN 16.1 04/22/2017 1243   CREATININE 1.26 (H) 01/11/2024 0742   CREATININE 0.8 04/22/2017 1243   CALCIUM 9.9 01/11/2024 0742   CALCIUM 10.0 04/22/2017 1243   PROT 7.4 01/11/2024 0742   PROT 7.6 04/22/2017 1243   ALBUMIN 3.9 01/11/2024 0742   ALBUMIN 3.9 04/22/2017 1243   AST 18 01/11/2024 0742   AST 26 04/22/2017 1243   ALT 10 01/11/2024 0742   ALT 18 04/22/2017 1243   ALKPHOS 94 01/11/2024 0742   ALKPHOS 76 04/22/2017 1243   BILITOT 0.3 01/11/2024 0742   BILITOT 0.24 04/22/2017 1243   GFRNONAA 43 (L) 01/11/2024 0742   GFRAA 56 (L) 01/03/2020 2023     Lab Results  Component Value Date   WBC 12.4 (H) 01/11/2024   NEUTROABS 8.6 (H) 01/11/2024   HGB 11.2 (L) 01/11/2024   HCT 35.5 (L) 01/11/2024   MCV 94.2 01/11/2024   PLT 341 01/11/2024

## 2024-01-11 ENCOUNTER — Encounter: Payer: Self-pay | Admitting: Nurse Practitioner

## 2024-01-11 ENCOUNTER — Inpatient Hospital Stay: Payer: PPO | Attending: Nurse Practitioner | Admitting: Nurse Practitioner

## 2024-01-11 ENCOUNTER — Inpatient Hospital Stay: Payer: PPO

## 2024-01-11 VITALS — BP 140/78 | HR 83 | Temp 97.8°F | Resp 17 | Wt 156.9 lb

## 2024-01-11 DIAGNOSIS — D473 Essential (hemorrhagic) thrombocythemia: Secondary | ICD-10-CM

## 2024-01-11 DIAGNOSIS — Z79899 Other long term (current) drug therapy: Secondary | ICD-10-CM | POA: Insufficient documentation

## 2024-01-11 DIAGNOSIS — N189 Chronic kidney disease, unspecified: Secondary | ICD-10-CM | POA: Insufficient documentation

## 2024-01-11 DIAGNOSIS — D72829 Elevated white blood cell count, unspecified: Secondary | ICD-10-CM | POA: Diagnosis not present

## 2024-01-11 DIAGNOSIS — D509 Iron deficiency anemia, unspecified: Secondary | ICD-10-CM | POA: Diagnosis not present

## 2024-01-11 LAB — CMP (CANCER CENTER ONLY)
ALT: 10 U/L (ref 0–44)
AST: 18 U/L (ref 15–41)
Albumin: 3.9 g/dL (ref 3.5–5.0)
Alkaline Phosphatase: 94 U/L (ref 38–126)
Anion gap: 7 (ref 5–15)
BUN: 21 mg/dL (ref 8–23)
CO2: 28 mmol/L (ref 22–32)
Calcium: 9.9 mg/dL (ref 8.9–10.3)
Chloride: 103 mmol/L (ref 98–111)
Creatinine: 1.26 mg/dL — ABNORMAL HIGH (ref 0.44–1.00)
GFR, Estimated: 43 mL/min — ABNORMAL LOW (ref >60)
Glucose, Bld: 106 mg/dL — ABNORMAL HIGH (ref 70–99)
Potassium: 4.1 mmol/L (ref 3.5–5.1)
Sodium: 138 mmol/L (ref 135–145)
Total Bilirubin: 0.3 mg/dL (ref 0.0–1.2)
Total Protein: 7.4 g/dL (ref 6.5–8.1)

## 2024-01-11 LAB — CBC WITH DIFFERENTIAL (CANCER CENTER ONLY)
Abs Immature Granulocytes: 0.14 K/uL — ABNORMAL HIGH (ref 0.00–0.07)
Basophils Absolute: 0.1 K/uL (ref 0.0–0.1)
Basophils Relative: 1 %
Eosinophils Absolute: 0.6 K/uL — ABNORMAL HIGH (ref 0.0–0.5)
Eosinophils Relative: 5 %
HCT: 35.5 % — ABNORMAL LOW (ref 36.0–46.0)
Hemoglobin: 11.2 g/dL — ABNORMAL LOW (ref 12.0–15.0)
Immature Granulocytes: 1 %
Lymphocytes Relative: 17 %
Lymphs Abs: 2.1 K/uL (ref 0.7–4.0)
MCH: 29.7 pg (ref 26.0–34.0)
MCHC: 31.5 g/dL (ref 30.0–36.0)
MCV: 94.2 fL (ref 80.0–100.0)
Monocytes Absolute: 0.9 K/uL (ref 0.1–1.0)
Monocytes Relative: 7 %
Neutro Abs: 8.6 K/uL — ABNORMAL HIGH (ref 1.7–7.7)
Neutrophils Relative %: 69 %
Platelet Count: 341 K/uL (ref 150–400)
RBC: 3.77 MIL/uL — ABNORMAL LOW (ref 3.87–5.11)
RDW: 14.2 % (ref 11.5–15.5)
WBC Count: 12.4 K/uL — ABNORMAL HIGH (ref 4.0–10.5)
nRBC: 0 % (ref 0.0–0.2)

## 2024-01-11 LAB — RETICULOCYTES
Immature Retic Fract: 20 % — ABNORMAL HIGH (ref 2.3–15.9)
RBC.: 3.69 MIL/uL — ABNORMAL LOW (ref 3.87–5.11)
Retic Count, Absolute: 75.6 K/uL (ref 19.0–186.0)
Retic Ct Pct: 2.1 % (ref 0.4–3.1)

## 2024-01-12 ENCOUNTER — Other Ambulatory Visit: Payer: Self-pay

## 2024-01-12 ENCOUNTER — Ambulatory Visit
Admission: RE | Admit: 2024-01-12 | Discharge: 2024-01-12 | Discharge status: Home / Self Care | Source: Ambulatory Visit | Attending: Radiation Oncology

## 2024-01-12 ENCOUNTER — Ambulatory Visit
Admission: RE | Admit: 2024-01-12 | Discharge: 2024-01-12 | Disposition: A | Discharge status: Home / Self Care | Source: Ambulatory Visit | Attending: Radiation Oncology | Admitting: Radiation Oncology

## 2024-01-12 DIAGNOSIS — Z510 Encounter for antineoplastic radiation therapy: Secondary | ICD-10-CM | POA: Diagnosis not present

## 2024-01-12 LAB — RAD ONC ARIA SESSION SUMMARY
Course Elapsed Days: 0
Plan Fractions Treated to Date: 1
Plan Prescribed Dose Per Fraction: 2.5 Gy
Plan Total Fractions Prescribed: 20
Plan Total Prescribed Dose: 50 Gy
Reference Point Dosage Given to Date: 2.5 Gy
Reference Point Session Dosage Given: 2.5 Gy
Session Number: 1

## 2024-01-13 ENCOUNTER — Ambulatory Visit
Admission: RE | Admit: 2024-01-13 | Discharge: 2024-01-13 | Disposition: A | Discharge status: Home / Self Care | Source: Ambulatory Visit | Attending: Radiation Oncology | Admitting: Radiation Oncology

## 2024-01-13 ENCOUNTER — Other Ambulatory Visit: Payer: Self-pay

## 2024-01-13 DIAGNOSIS — Z510 Encounter for antineoplastic radiation therapy: Secondary | ICD-10-CM | POA: Diagnosis not present

## 2024-01-13 LAB — RAD ONC ARIA SESSION SUMMARY
Course Elapsed Days: 1
Plan Fractions Treated to Date: 2
Plan Prescribed Dose Per Fraction: 2.5 Gy
Plan Total Fractions Prescribed: 20
Plan Total Prescribed Dose: 50 Gy
Reference Point Dosage Given to Date: 5 Gy
Reference Point Session Dosage Given: 2.5 Gy
Session Number: 2

## 2024-01-14 ENCOUNTER — Ambulatory Visit
Admission: RE | Admit: 2024-01-14 | Discharge: 2024-01-14 | Disposition: A | Discharge status: Home / Self Care | Source: Ambulatory Visit | Attending: Radiation Oncology

## 2024-01-14 ENCOUNTER — Other Ambulatory Visit: Payer: Self-pay

## 2024-01-14 DIAGNOSIS — E1122 Type 2 diabetes mellitus with diabetic chronic kidney disease: Secondary | ICD-10-CM | POA: Diagnosis not present

## 2024-01-14 DIAGNOSIS — Z510 Encounter for antineoplastic radiation therapy: Secondary | ICD-10-CM | POA: Diagnosis not present

## 2024-01-14 DIAGNOSIS — E7800 Pure hypercholesterolemia, unspecified: Secondary | ICD-10-CM | POA: Diagnosis not present

## 2024-01-14 DIAGNOSIS — N183 Chronic kidney disease, stage 3 unspecified: Secondary | ICD-10-CM | POA: Diagnosis not present

## 2024-01-14 DIAGNOSIS — I129 Hypertensive chronic kidney disease with stage 1 through stage 4 chronic kidney disease, or unspecified chronic kidney disease: Secondary | ICD-10-CM | POA: Diagnosis not present

## 2024-01-14 LAB — RAD ONC ARIA SESSION SUMMARY
Course Elapsed Days: 2
Plan Fractions Treated to Date: 3
Plan Prescribed Dose Per Fraction: 2.5 Gy
Plan Total Fractions Prescribed: 20
Plan Total Prescribed Dose: 50 Gy
Reference Point Dosage Given to Date: 7.5 Gy
Reference Point Session Dosage Given: 2.5 Gy
Session Number: 3

## 2024-01-15 ENCOUNTER — Ambulatory Visit
Admission: RE | Admit: 2024-01-15 | Discharge: 2024-01-15 | Disposition: A | Discharge status: Home / Self Care | Source: Ambulatory Visit | Attending: Radiation Oncology | Admitting: Radiation Oncology

## 2024-01-15 ENCOUNTER — Other Ambulatory Visit: Payer: Self-pay

## 2024-01-15 DIAGNOSIS — Z510 Encounter for antineoplastic radiation therapy: Secondary | ICD-10-CM | POA: Insufficient documentation

## 2024-01-15 DIAGNOSIS — C44311 Basal cell carcinoma of skin of nose: Secondary | ICD-10-CM | POA: Insufficient documentation

## 2024-01-15 LAB — RAD ONC ARIA SESSION SUMMARY
Course Elapsed Days: 3
Plan Fractions Treated to Date: 4
Plan Prescribed Dose Per Fraction: 2.5 Gy
Plan Total Fractions Prescribed: 20
Plan Total Prescribed Dose: 50 Gy
Reference Point Dosage Given to Date: 10 Gy
Reference Point Session Dosage Given: 2.5 Gy
Session Number: 4

## 2024-01-18 ENCOUNTER — Other Ambulatory Visit: Payer: Self-pay | Admitting: Nurse Practitioner

## 2024-01-18 ENCOUNTER — Other Ambulatory Visit: Payer: Self-pay

## 2024-01-18 ENCOUNTER — Ambulatory Visit
Admission: RE | Admit: 2024-01-18 | Discharge: 2024-01-18 | Disposition: A | Discharge status: Home / Self Care | Source: Ambulatory Visit | Attending: Radiation Oncology | Admitting: Radiation Oncology

## 2024-01-18 DIAGNOSIS — Z510 Encounter for antineoplastic radiation therapy: Secondary | ICD-10-CM | POA: Diagnosis not present

## 2024-01-18 DIAGNOSIS — D473 Essential (hemorrhagic) thrombocythemia: Secondary | ICD-10-CM

## 2024-01-18 DIAGNOSIS — C44311 Basal cell carcinoma of skin of nose: Secondary | ICD-10-CM | POA: Diagnosis not present

## 2024-01-18 LAB — RAD ONC ARIA SESSION SUMMARY
Course Elapsed Days: 6
Plan Fractions Treated to Date: 5
Plan Prescribed Dose Per Fraction: 2.5 Gy
Plan Total Fractions Prescribed: 20
Plan Total Prescribed Dose: 50 Gy
Reference Point Dosage Given to Date: 12.5 Gy
Reference Point Session Dosage Given: 2.5 Gy
Session Number: 5

## 2024-01-19 ENCOUNTER — Other Ambulatory Visit: Payer: Self-pay

## 2024-01-19 ENCOUNTER — Ambulatory Visit
Admission: RE | Admit: 2024-01-19 | Discharge: 2024-01-19 | Disposition: A | Discharge status: Home / Self Care | Source: Ambulatory Visit | Attending: Radiation Oncology

## 2024-01-19 DIAGNOSIS — Z510 Encounter for antineoplastic radiation therapy: Secondary | ICD-10-CM | POA: Diagnosis not present

## 2024-01-19 LAB — RAD ONC ARIA SESSION SUMMARY
Course Elapsed Days: 7
Plan Fractions Treated to Date: 6
Plan Prescribed Dose Per Fraction: 2.5 Gy
Plan Total Fractions Prescribed: 20
Plan Total Prescribed Dose: 50 Gy
Reference Point Dosage Given to Date: 15 Gy
Reference Point Session Dosage Given: 2.5 Gy
Session Number: 6

## 2024-01-20 ENCOUNTER — Other Ambulatory Visit: Payer: Self-pay

## 2024-01-20 ENCOUNTER — Ambulatory Visit
Admission: RE | Admit: 2024-01-20 | Discharge: 2024-01-20 | Disposition: A | Discharge status: Home / Self Care | Source: Ambulatory Visit | Attending: Radiation Oncology

## 2024-01-20 DIAGNOSIS — Z510 Encounter for antineoplastic radiation therapy: Secondary | ICD-10-CM | POA: Diagnosis not present

## 2024-01-20 LAB — RAD ONC ARIA SESSION SUMMARY
Course Elapsed Days: 8
Plan Fractions Treated to Date: 7
Plan Prescribed Dose Per Fraction: 2.5 Gy
Plan Total Fractions Prescribed: 20
Plan Total Prescribed Dose: 50 Gy
Reference Point Dosage Given to Date: 17.5 Gy
Reference Point Session Dosage Given: 2.5 Gy
Session Number: 7

## 2024-01-21 ENCOUNTER — Other Ambulatory Visit: Payer: Self-pay

## 2024-01-21 ENCOUNTER — Ambulatory Visit
Admission: RE | Admit: 2024-01-21 | Discharge: 2024-01-21 | Disposition: A | Discharge status: Home / Self Care | Source: Ambulatory Visit | Attending: Radiation Oncology

## 2024-01-21 DIAGNOSIS — Z510 Encounter for antineoplastic radiation therapy: Secondary | ICD-10-CM | POA: Diagnosis not present

## 2024-01-21 LAB — RAD ONC ARIA SESSION SUMMARY
Course Elapsed Days: 9
Plan Fractions Treated to Date: 8
Plan Prescribed Dose Per Fraction: 2.5 Gy
Plan Total Fractions Prescribed: 20
Plan Total Prescribed Dose: 50 Gy
Reference Point Dosage Given to Date: 20 Gy
Reference Point Session Dosage Given: 2.5 Gy
Session Number: 8

## 2024-01-22 ENCOUNTER — Ambulatory Visit
Admission: RE | Admit: 2024-01-22 | Discharge: 2024-01-22 | Disposition: A | Discharge status: Home / Self Care | Source: Ambulatory Visit | Attending: Radiation Oncology | Admitting: Radiation Oncology

## 2024-01-22 ENCOUNTER — Other Ambulatory Visit: Payer: Self-pay

## 2024-01-22 DIAGNOSIS — Z510 Encounter for antineoplastic radiation therapy: Secondary | ICD-10-CM | POA: Diagnosis not present

## 2024-01-22 LAB — RAD ONC ARIA SESSION SUMMARY
Course Elapsed Days: 10
Plan Fractions Treated to Date: 9
Plan Prescribed Dose Per Fraction: 2.5 Gy
Plan Total Fractions Prescribed: 20
Plan Total Prescribed Dose: 50 Gy
Reference Point Dosage Given to Date: 22.5 Gy
Reference Point Session Dosage Given: 2.5 Gy
Session Number: 9

## 2024-01-25 ENCOUNTER — Ambulatory Visit
Admission: RE | Admit: 2024-01-25 | Discharge: 2024-01-25 | Disposition: A | Discharge status: Home / Self Care | Source: Ambulatory Visit | Attending: Radiation Oncology

## 2024-01-25 ENCOUNTER — Ambulatory Visit
Admission: RE | Admit: 2024-01-25 | Discharge: 2024-01-25 | Disposition: A | Discharge status: Home / Self Care | Source: Ambulatory Visit | Attending: Radiation Oncology | Admitting: Radiation Oncology

## 2024-01-25 ENCOUNTER — Other Ambulatory Visit: Payer: Self-pay

## 2024-01-25 DIAGNOSIS — C44311 Basal cell carcinoma of skin of nose: Secondary | ICD-10-CM | POA: Diagnosis not present

## 2024-01-25 DIAGNOSIS — Z510 Encounter for antineoplastic radiation therapy: Secondary | ICD-10-CM | POA: Diagnosis not present

## 2024-01-25 LAB — RAD ONC ARIA SESSION SUMMARY
Course Elapsed Days: 13
Plan Fractions Treated to Date: 10
Plan Prescribed Dose Per Fraction: 2.5 Gy
Plan Total Fractions Prescribed: 20
Plan Total Prescribed Dose: 50 Gy
Reference Point Dosage Given to Date: 25 Gy
Reference Point Session Dosage Given: 2.5 Gy
Session Number: 10

## 2024-01-26 ENCOUNTER — Ambulatory Visit
Admission: RE | Admit: 2024-01-26 | Discharge: 2024-01-26 | Disposition: A | Discharge status: Home / Self Care | Source: Ambulatory Visit | Attending: Radiation Oncology

## 2024-01-26 ENCOUNTER — Ambulatory Visit

## 2024-01-26 ENCOUNTER — Other Ambulatory Visit: Payer: Self-pay

## 2024-01-26 DIAGNOSIS — Z510 Encounter for antineoplastic radiation therapy: Secondary | ICD-10-CM | POA: Diagnosis not present

## 2024-01-26 LAB — RAD ONC ARIA SESSION SUMMARY
Course Elapsed Days: 14
Plan Fractions Treated to Date: 11
Plan Prescribed Dose Per Fraction: 2.5 Gy
Plan Total Fractions Prescribed: 20
Plan Total Prescribed Dose: 50 Gy
Reference Point Dosage Given to Date: 27.5 Gy
Reference Point Session Dosage Given: 2.5 Gy
Session Number: 11

## 2024-01-27 ENCOUNTER — Other Ambulatory Visit: Payer: Self-pay

## 2024-01-27 ENCOUNTER — Ambulatory Visit
Admission: RE | Admit: 2024-01-27 | Discharge: 2024-01-27 | Disposition: A | Discharge status: Home / Self Care | Source: Ambulatory Visit | Attending: Radiation Oncology | Admitting: Radiation Oncology

## 2024-01-27 DIAGNOSIS — Z510 Encounter for antineoplastic radiation therapy: Secondary | ICD-10-CM | POA: Diagnosis not present

## 2024-01-27 LAB — RAD ONC ARIA SESSION SUMMARY
Course Elapsed Days: 15
Plan Fractions Treated to Date: 12
Plan Prescribed Dose Per Fraction: 2.5 Gy
Plan Total Fractions Prescribed: 20
Plan Total Prescribed Dose: 50 Gy
Reference Point Dosage Given to Date: 30 Gy
Reference Point Session Dosage Given: 2.5 Gy
Session Number: 12

## 2024-01-28 ENCOUNTER — Other Ambulatory Visit: Payer: Self-pay

## 2024-01-28 ENCOUNTER — Ambulatory Visit
Admission: RE | Admit: 2024-01-28 | Discharge: 2024-01-28 | Disposition: A | Discharge status: Home / Self Care | Source: Ambulatory Visit | Attending: Radiation Oncology | Admitting: Radiation Oncology

## 2024-01-28 DIAGNOSIS — Z510 Encounter for antineoplastic radiation therapy: Secondary | ICD-10-CM | POA: Diagnosis not present

## 2024-01-28 LAB — RAD ONC ARIA SESSION SUMMARY
Course Elapsed Days: 16
Plan Fractions Treated to Date: 13
Plan Prescribed Dose Per Fraction: 2.5 Gy
Plan Total Fractions Prescribed: 20
Plan Total Prescribed Dose: 50 Gy
Reference Point Dosage Given to Date: 32.5 Gy
Reference Point Session Dosage Given: 2.5 Gy
Session Number: 13

## 2024-01-29 ENCOUNTER — Other Ambulatory Visit: Payer: Self-pay

## 2024-01-29 ENCOUNTER — Ambulatory Visit
Admission: RE | Admit: 2024-01-29 | Discharge: 2024-01-29 | Disposition: A | Discharge status: Home / Self Care | Source: Ambulatory Visit | Attending: Radiation Oncology | Admitting: Radiation Oncology

## 2024-01-29 DIAGNOSIS — Z510 Encounter for antineoplastic radiation therapy: Secondary | ICD-10-CM | POA: Diagnosis not present

## 2024-01-29 LAB — RAD ONC ARIA SESSION SUMMARY
Course Elapsed Days: 17
Plan Fractions Treated to Date: 14
Plan Prescribed Dose Per Fraction: 2.5 Gy
Plan Total Fractions Prescribed: 20
Plan Total Prescribed Dose: 50 Gy
Reference Point Dosage Given to Date: 35 Gy
Reference Point Session Dosage Given: 2.5 Gy
Session Number: 14

## 2024-01-30 DIAGNOSIS — N183 Chronic kidney disease, stage 3 unspecified: Secondary | ICD-10-CM | POA: Diagnosis not present

## 2024-01-30 DIAGNOSIS — I129 Hypertensive chronic kidney disease with stage 1 through stage 4 chronic kidney disease, or unspecified chronic kidney disease: Secondary | ICD-10-CM | POA: Diagnosis not present

## 2024-01-30 DIAGNOSIS — E1122 Type 2 diabetes mellitus with diabetic chronic kidney disease: Secondary | ICD-10-CM | POA: Diagnosis not present

## 2024-02-01 ENCOUNTER — Ambulatory Visit
Admission: RE | Admit: 2024-02-01 | Discharge: 2024-02-01 | Disposition: A | Discharge status: Home / Self Care | Source: Ambulatory Visit | Attending: Radiation Oncology | Admitting: Radiation Oncology

## 2024-02-01 ENCOUNTER — Other Ambulatory Visit: Payer: Self-pay

## 2024-02-01 ENCOUNTER — Ambulatory Visit
Admission: RE | Admit: 2024-02-01 | Discharge: 2024-02-01 | Disposition: A | Discharge status: Home / Self Care | Source: Ambulatory Visit | Attending: Radiation Oncology

## 2024-02-01 DIAGNOSIS — Z510 Encounter for antineoplastic radiation therapy: Secondary | ICD-10-CM | POA: Diagnosis not present

## 2024-02-01 DIAGNOSIS — C44311 Basal cell carcinoma of skin of nose: Secondary | ICD-10-CM | POA: Diagnosis not present

## 2024-02-01 LAB — RAD ONC ARIA SESSION SUMMARY
Course Elapsed Days: 20
Plan Fractions Treated to Date: 15
Plan Prescribed Dose Per Fraction: 2.5 Gy
Plan Total Fractions Prescribed: 20
Plan Total Prescribed Dose: 50 Gy
Reference Point Dosage Given to Date: 37.5 Gy
Reference Point Session Dosage Given: 2.5 Gy
Session Number: 15

## 2024-02-02 ENCOUNTER — Ambulatory Visit
Admission: RE | Admit: 2024-02-02 | Discharge: 2024-02-02 | Disposition: A | Discharge status: Home / Self Care | Source: Ambulatory Visit | Attending: Radiation Oncology | Admitting: Radiation Oncology

## 2024-02-02 ENCOUNTER — Other Ambulatory Visit: Payer: Self-pay

## 2024-02-02 DIAGNOSIS — Z510 Encounter for antineoplastic radiation therapy: Secondary | ICD-10-CM | POA: Diagnosis not present

## 2024-02-02 LAB — RAD ONC ARIA SESSION SUMMARY
Course Elapsed Days: 21
Plan Fractions Treated to Date: 16
Plan Prescribed Dose Per Fraction: 2.5 Gy
Plan Total Fractions Prescribed: 20
Plan Total Prescribed Dose: 50 Gy
Reference Point Dosage Given to Date: 40 Gy
Reference Point Session Dosage Given: 2.5 Gy
Session Number: 16

## 2024-02-03 ENCOUNTER — Other Ambulatory Visit: Payer: Self-pay

## 2024-02-03 ENCOUNTER — Ambulatory Visit
Admission: RE | Admit: 2024-02-03 | Discharge: 2024-02-03 | Disposition: A | Discharge status: Home / Self Care | Source: Ambulatory Visit | Attending: Radiation Oncology

## 2024-02-03 DIAGNOSIS — Z510 Encounter for antineoplastic radiation therapy: Secondary | ICD-10-CM | POA: Diagnosis not present

## 2024-02-03 LAB — RAD ONC ARIA SESSION SUMMARY
Course Elapsed Days: 22
Plan Fractions Treated to Date: 17
Plan Prescribed Dose Per Fraction: 2.5 Gy
Plan Total Fractions Prescribed: 20
Plan Total Prescribed Dose: 50 Gy
Reference Point Dosage Given to Date: 42.5 Gy
Reference Point Session Dosage Given: 2.5 Gy
Session Number: 17

## 2024-02-04 ENCOUNTER — Ambulatory Visit
Admission: RE | Admit: 2024-02-04 | Discharge: 2024-02-04 | Disposition: A | Discharge status: Home / Self Care | Source: Ambulatory Visit | Attending: Radiation Oncology

## 2024-02-04 ENCOUNTER — Other Ambulatory Visit: Payer: Self-pay

## 2024-02-04 DIAGNOSIS — Z510 Encounter for antineoplastic radiation therapy: Secondary | ICD-10-CM | POA: Diagnosis not present

## 2024-02-04 LAB — RAD ONC ARIA SESSION SUMMARY
Course Elapsed Days: 23
Plan Fractions Treated to Date: 18
Plan Prescribed Dose Per Fraction: 2.5 Gy
Plan Total Fractions Prescribed: 20
Plan Total Prescribed Dose: 50 Gy
Reference Point Dosage Given to Date: 45 Gy
Reference Point Session Dosage Given: 2.5 Gy
Session Number: 18

## 2024-02-05 ENCOUNTER — Ambulatory Visit
Admission: RE | Admit: 2024-02-05 | Discharge: 2024-02-05 | Disposition: A | Discharge status: Home / Self Care | Source: Ambulatory Visit | Attending: Radiation Oncology | Admitting: Radiation Oncology

## 2024-02-05 ENCOUNTER — Other Ambulatory Visit: Payer: Self-pay

## 2024-02-05 DIAGNOSIS — Z510 Encounter for antineoplastic radiation therapy: Secondary | ICD-10-CM | POA: Diagnosis not present

## 2024-02-05 LAB — RAD ONC ARIA SESSION SUMMARY
Course Elapsed Days: 24
Plan Fractions Treated to Date: 19
Plan Prescribed Dose Per Fraction: 2.5 Gy
Plan Total Fractions Prescribed: 20
Plan Total Prescribed Dose: 50 Gy
Reference Point Dosage Given to Date: 47.5 Gy
Reference Point Session Dosage Given: 2.5 Gy
Session Number: 19

## 2024-02-08 ENCOUNTER — Ambulatory Visit
Admission: RE | Admit: 2024-02-08 | Discharge: 2024-02-08 | Disposition: A | Discharge status: Home / Self Care | Source: Ambulatory Visit | Attending: Radiation Oncology

## 2024-02-08 ENCOUNTER — Ambulatory Visit
Admission: RE | Admit: 2024-02-08 | Discharge: 2024-02-08 | Disposition: A | Discharge status: Home / Self Care | Source: Ambulatory Visit | Attending: Radiation Oncology | Admitting: Radiation Oncology

## 2024-02-08 ENCOUNTER — Other Ambulatory Visit: Payer: Self-pay

## 2024-02-08 DIAGNOSIS — Z510 Encounter for antineoplastic radiation therapy: Secondary | ICD-10-CM | POA: Diagnosis not present

## 2024-02-08 DIAGNOSIS — C44311 Basal cell carcinoma of skin of nose: Secondary | ICD-10-CM | POA: Diagnosis not present

## 2024-02-08 LAB — RAD ONC ARIA SESSION SUMMARY
Course Elapsed Days: 27
Plan Fractions Treated to Date: 20
Plan Prescribed Dose Per Fraction: 2.5 Gy
Plan Total Fractions Prescribed: 20
Plan Total Prescribed Dose: 50 Gy
Reference Point Dosage Given to Date: 50 Gy
Reference Point Session Dosage Given: 2.5 Gy
Session Number: 20

## 2024-02-09 ENCOUNTER — Telehealth: Payer: Self-pay | Admitting: Radiation Oncology

## 2024-02-09 NOTE — Telephone Encounter (Signed)
 8/26 @ 8:21 am Left voicemail for patient to call our office to be sch for FU20 appointment.

## 2024-02-09 NOTE — Radiation Completion Notes (Signed)
 Patient Name: Yvonne Rose, Yvonne Rose MRN: [id number] Date of Birth: 1946/04/29 Referring Physician: SAMULE LUNGER, M.D. Date of Service: 2024-02-09 Radiation Oncologist: Lauraine Golden, M.D. [facility] [place]                             RADIATION ONCOLOGY END OF TREATMENT NOTE     Diagnosis: C44.311 Basal cell carcinoma of skin of nose Staging on 2023-12-28: Basal cell carcinoma of left ala nasi T=cT1, N=cN0, M=cM0 Intent: Curative     ==========DELIVERED PLANS==========  First Treatment Date: 2024-01-12 Last Treatment Date: 2024-02-08   Plan Name: HN_L_Nasal Site: Nose Technique: Electron Mode: Electron Dose Per Fraction: 2.5 Gy Prescribed Dose (Delivered / Prescribed): 50 Gy / 50 Gy Prescribed Fxs (Delivered / Prescribed): 20 / 20     ==========ON TREATMENT VISIT DATES========== 2024-01-12, 2024-01-25, 2024-02-01, 2024-02-08     ==========UPCOMING VISITS========== 04/13/2024 [facility] DIAG RAD DG DEXA [facility]B-DEXA        ==========APPENDIX - ON TREATMENT VISIT NOTES==========   See weekly On Treatment Notes in Epic for details in the Media tab (listed as Progress notes on the On Treatment Visit Dates listed above).

## 2024-02-14 DIAGNOSIS — E7800 Pure hypercholesterolemia, unspecified: Secondary | ICD-10-CM | POA: Diagnosis not present

## 2024-02-14 DIAGNOSIS — E1122 Type 2 diabetes mellitus with diabetic chronic kidney disease: Secondary | ICD-10-CM | POA: Diagnosis not present

## 2024-02-14 DIAGNOSIS — I129 Hypertensive chronic kidney disease with stage 1 through stage 4 chronic kidney disease, or unspecified chronic kidney disease: Secondary | ICD-10-CM | POA: Diagnosis not present

## 2024-02-14 DIAGNOSIS — N183 Chronic kidney disease, stage 3 unspecified: Secondary | ICD-10-CM | POA: Diagnosis not present

## 2024-02-19 DIAGNOSIS — E1122 Type 2 diabetes mellitus with diabetic chronic kidney disease: Secondary | ICD-10-CM | POA: Diagnosis not present

## 2024-02-19 DIAGNOSIS — E7800 Pure hypercholesterolemia, unspecified: Secondary | ICD-10-CM | POA: Diagnosis not present

## 2024-02-19 DIAGNOSIS — Z23 Encounter for immunization: Secondary | ICD-10-CM | POA: Diagnosis not present

## 2024-02-19 DIAGNOSIS — I129 Hypertensive chronic kidney disease with stage 1 through stage 4 chronic kidney disease, or unspecified chronic kidney disease: Secondary | ICD-10-CM | POA: Diagnosis not present

## 2024-02-19 DIAGNOSIS — N183 Chronic kidney disease, stage 3 unspecified: Secondary | ICD-10-CM | POA: Diagnosis not present

## 2024-02-29 DIAGNOSIS — E1122 Type 2 diabetes mellitus with diabetic chronic kidney disease: Secondary | ICD-10-CM | POA: Diagnosis not present

## 2024-02-29 DIAGNOSIS — N183 Chronic kidney disease, stage 3 unspecified: Secondary | ICD-10-CM | POA: Diagnosis not present

## 2024-02-29 DIAGNOSIS — I129 Hypertensive chronic kidney disease with stage 1 through stage 4 chronic kidney disease, or unspecified chronic kidney disease: Secondary | ICD-10-CM | POA: Diagnosis not present

## 2024-03-01 ENCOUNTER — Encounter: Payer: Self-pay | Admitting: Radiology

## 2024-03-01 ENCOUNTER — Ambulatory Visit
Admission: RE | Admit: 2024-03-01 | Discharge: 2024-03-01 | Disposition: A | Discharge status: Home / Self Care | Source: Ambulatory Visit | Attending: Radiology | Admitting: Radiology

## 2024-03-01 VITALS — BP 159/74 | HR 78 | Temp 97.6°F | Resp 20 | Ht 64.0 in | Wt 154.2 lb

## 2024-03-01 DIAGNOSIS — Z923 Personal history of irradiation: Secondary | ICD-10-CM | POA: Diagnosis not present

## 2024-03-01 DIAGNOSIS — C44311 Basal cell carcinoma of skin of nose: Secondary | ICD-10-CM | POA: Diagnosis not present

## 2024-03-01 DIAGNOSIS — Z79899 Other long term (current) drug therapy: Secondary | ICD-10-CM | POA: Insufficient documentation

## 2024-03-01 HISTORY — DX: Personal history of irradiation: Z92.3

## 2024-03-01 NOTE — Progress Notes (Signed)
 The patient returns today for routine follow-up. She completed her treatment on 02/08/24.   Diagnosis: Basal cell carcinoma of skin of nose     Pain issues, if any: Denies Weight changes, if any: Denies Swallowing issues, if any: Denies Smoking or chewing tobacco? Denies Using fluoride toothpaste daily? Denies Last ENT visit was on: Denies Other notable issues, if any: None       BP (!) 159/74 (BP Location: Left Arm, Patient Position: Sitting, Cuff Size: Large)   Pulse 78   Temp 97.6 F (36.4 C)   Resp 20   Ht (P) 5' 4 (1.626 m)   Wt 154 lb 3.2 oz (69.9 kg)   SpO2 95%   BMI (P) 26.47 kg/m

## 2024-03-01 NOTE — Progress Notes (Signed)
 Radiation Oncology         [phone] ________________________________  Name: Yvonne Rose MRN: [id number]  Date: 03/01/2024  DOB: 03-09-1946  Follow-Up Visit Note  CC: Loreli Kins, MD  Loreli Kins, MD  Diagnosis and Prior Radiotherapy:       ICD-10-CM   1. Basal cell carcinoma of left ala nasi  C44.311         Cancer Staging  Basal cell carcinoma of left ala nasi Staging form: Cutaneous Carcinoma of the Head and Neck, AJCC 8th Edition - Clinical stage from 12/28/2023: Stage I (cT1, cN0, cM0) - Signed by Izell Domino, MD on 12/28/2023 Extraosseous extension: Absent  ==========DELIVERED PLANS==========  First Treatment Date: 2024-01-12 Last Treatment Date: 2024-02-08   Plan Name: HN_L_Nasal Site: Nose Technique: Electron Mode: Electron Dose Per Fraction: 2.5 Gy Prescribed Dose (Delivered / Prescribed): 50 Gy / 50 Gy Prescribed Fxs (Delivered / Prescribed): 20 / 20  Basal cell carcinoma (nodular type) of the left nasal ala; s/p definitive radiation completed on 02/08/2024  CHIEF COMPLAINT:  Here for follow-up and surveillance of basal cell carcinoma   Narrative:  The patient returns today for routine follow-up. She completed her treatment on 02/08/2024  Patient reports to be doing well today.  She is pleased with the way her skin has healed since completing radiation.  She denies any pain within the treatment field.  She denies any recent nosebleeds.  She notes a good energy level.  ALLERGIES:  is allergic to propoxyphene and sulfa antibiotics.  Meds: Current Outpatient Medications  Medication Sig Dispense Refill   acetaminophen  (TYLENOL ) 500 MG tablet Take 500 mg by mouth every 8 (eight) hours as needed for mild pain.      amLODipine (NORVASC) 5 MG tablet Take 5 mg by mouth daily.     anagrelide  (AGRYLIN) 0.5 MG capsule Take 1 capsule by mouth daily after dinner in addition to 1 mg daily in the morning. 90 capsule 0   anagrelide  (AGRYLIN) 1 MG capsule  Take 1 capsule by mouth once daily. 90 capsule 0   aspirin  81 MG EC tablet Adult Low Dose Aspirin  81 mg tablet,delayed release     hydrochlorothiazide (HYDRODIURIL) 25 MG tablet Take 25 mg by mouth daily.     metoprolol succinate (TOPROL-XL) 100 MG 24 hr tablet 200 mg daily.     omeprazole (PRILOSEC) 20 MG capsule Take 20 mg by mouth daily.     pravastatin  (PRAVACHOL ) 80 MG tablet Take 1 tablet (80 mg total) by mouth daily. 30 tablet 2   No current facility-administered medications for this encounter.    Physical Findings: The patient is in no acute distress. Patient is alert and oriented. Wt Readings from Last 3 Encounters:  03/01/24 154 lb 3.2 oz (69.9 kg)  01/11/24 156 lb 14.4 oz (71.2 kg)  12/28/23 155 lb 6.4 oz (70.5 kg)    height is 5' 4 (1.626 m) (pended) and weight is 154 lb 3.2 oz (69.9 kg). Maigen's temperature is 97.6 F (36.4 C). Vearl's blood pressure is 159/74 (abnormal) and Cattaleya's pulse is 78. Chamya's respiration is 20 and oxygen saturation is 95%. .  In general this is a well appearing female in no acute distress. She's alert and oriented x4 and appropriate throughout the examination. Cardiopulmonary assessment is negative for acute distress and she exhibits normal effort.     Mild erythema within the treatment field.  No concerning lesions within the left or right nasal cavity.    Lab Findings:  Lab Results  Component Value Date   WBC 12.4 (H) 01/11/2024   HGB 11.2 (L) 01/11/2024   HCT 35.5 (L) 01/11/2024   MCV 94.2 01/11/2024   PLT 341 01/11/2024    No results found for: TSH  Radiographic Findings: No results found.  Impression/Plan:  Basal cell carcinoma (nodular type) of the left nasal ala; s/p definitive radiation completed on 02/08/2024  Patient is healing well from the effects of her radiotherapy.  She is pleased with her response to the treatment thus far.   She will continue to follow with her dermatologist every 6 months. Radiation  follow-up PRN. We appreciate the opportunity to take part in this patient's care. She was encouraged to call with any questions or concerns.    On date of service, in total, I spent 20 minutes on this encounter. Patient was seen in person. _____________________________________    Leeroy Due, PA-C

## 2024-03-04 ENCOUNTER — Encounter: Payer: Self-pay | Admitting: Radiology

## 2024-03-15 DIAGNOSIS — E1122 Type 2 diabetes mellitus with diabetic chronic kidney disease: Secondary | ICD-10-CM | POA: Diagnosis not present

## 2024-03-15 DIAGNOSIS — E7800 Pure hypercholesterolemia, unspecified: Secondary | ICD-10-CM | POA: Diagnosis not present

## 2024-03-15 DIAGNOSIS — I129 Hypertensive chronic kidney disease with stage 1 through stage 4 chronic kidney disease, or unspecified chronic kidney disease: Secondary | ICD-10-CM | POA: Diagnosis not present

## 2024-03-15 DIAGNOSIS — N183 Chronic kidney disease, stage 3 unspecified: Secondary | ICD-10-CM | POA: Diagnosis not present

## 2024-03-30 DIAGNOSIS — E1122 Type 2 diabetes mellitus with diabetic chronic kidney disease: Secondary | ICD-10-CM | POA: Diagnosis not present

## 2024-03-30 DIAGNOSIS — N183 Chronic kidney disease, stage 3 unspecified: Secondary | ICD-10-CM | POA: Diagnosis not present

## 2024-03-30 DIAGNOSIS — I129 Hypertensive chronic kidney disease with stage 1 through stage 4 chronic kidney disease, or unspecified chronic kidney disease: Secondary | ICD-10-CM | POA: Diagnosis not present

## 2024-04-06 ENCOUNTER — Other Ambulatory Visit: Payer: Self-pay | Admitting: Nurse Practitioner

## 2024-04-13 ENCOUNTER — Encounter (HOSPITAL_BASED_OUTPATIENT_CLINIC_OR_DEPARTMENT_OTHER): Payer: Self-pay | Admitting: Family Medicine

## 2024-04-13 ENCOUNTER — Ambulatory Visit (HOSPITAL_BASED_OUTPATIENT_CLINIC_OR_DEPARTMENT_OTHER)
Admission: RE | Admit: 2024-04-13 | Discharge: 2024-04-13 | Disposition: A | Discharge status: Home / Self Care | Source: Ambulatory Visit | Attending: Family Medicine | Admitting: Family Medicine

## 2024-04-13 ENCOUNTER — Other Ambulatory Visit

## 2024-04-13 DIAGNOSIS — M8588 Other specified disorders of bone density and structure, other site: Secondary | ICD-10-CM | POA: Insufficient documentation

## 2024-04-13 DIAGNOSIS — M8589 Other specified disorders of bone density and structure, multiple sites: Secondary | ICD-10-CM | POA: Diagnosis not present

## 2024-04-13 DIAGNOSIS — M8580 Other specified disorders of bone density and structure, unspecified site: Secondary | ICD-10-CM | POA: Diagnosis present

## 2024-04-13 DIAGNOSIS — Z780 Asymptomatic menopausal state: Secondary | ICD-10-CM | POA: Insufficient documentation

## 2024-04-15 DIAGNOSIS — N183 Chronic kidney disease, stage 3 unspecified: Secondary | ICD-10-CM | POA: Diagnosis not present

## 2024-04-15 DIAGNOSIS — E7800 Pure hypercholesterolemia, unspecified: Secondary | ICD-10-CM | POA: Diagnosis not present

## 2024-04-15 DIAGNOSIS — I129 Hypertensive chronic kidney disease with stage 1 through stage 4 chronic kidney disease, or unspecified chronic kidney disease: Secondary | ICD-10-CM | POA: Diagnosis not present

## 2024-04-15 DIAGNOSIS — E1122 Type 2 diabetes mellitus with diabetic chronic kidney disease: Secondary | ICD-10-CM | POA: Diagnosis not present

## 2024-04-17 ENCOUNTER — Other Ambulatory Visit: Payer: Self-pay | Admitting: Nurse Practitioner

## 2024-04-17 DIAGNOSIS — D473 Essential (hemorrhagic) thrombocythemia: Secondary | ICD-10-CM

## 2024-04-29 DIAGNOSIS — E1122 Type 2 diabetes mellitus with diabetic chronic kidney disease: Secondary | ICD-10-CM | POA: Diagnosis not present

## 2024-04-29 DIAGNOSIS — N183 Chronic kidney disease, stage 3 unspecified: Secondary | ICD-10-CM | POA: Diagnosis not present

## 2024-04-29 DIAGNOSIS — I129 Hypertensive chronic kidney disease with stage 1 through stage 4 chronic kidney disease, or unspecified chronic kidney disease: Secondary | ICD-10-CM | POA: Diagnosis not present

## 2024-05-05 DIAGNOSIS — L989 Disorder of the skin and subcutaneous tissue, unspecified: Secondary | ICD-10-CM | POA: Diagnosis not present

## 2024-05-15 DIAGNOSIS — E1122 Type 2 diabetes mellitus with diabetic chronic kidney disease: Secondary | ICD-10-CM | POA: Diagnosis not present

## 2024-05-15 DIAGNOSIS — E7800 Pure hypercholesterolemia, unspecified: Secondary | ICD-10-CM | POA: Diagnosis not present

## 2024-05-15 DIAGNOSIS — I129 Hypertensive chronic kidney disease with stage 1 through stage 4 chronic kidney disease, or unspecified chronic kidney disease: Secondary | ICD-10-CM | POA: Diagnosis not present

## 2024-05-15 DIAGNOSIS — N183 Chronic kidney disease, stage 3 unspecified: Secondary | ICD-10-CM | POA: Diagnosis not present

## 2024-05-16 DIAGNOSIS — D485 Neoplasm of uncertain behavior of skin: Secondary | ICD-10-CM | POA: Diagnosis not present

## 2024-05-16 DIAGNOSIS — C44622 Squamous cell carcinoma of skin of right upper limb, including shoulder: Secondary | ICD-10-CM | POA: Diagnosis not present

## 2024-06-28 ENCOUNTER — Other Ambulatory Visit: Payer: Self-pay | Admitting: Nurse Practitioner

## 2024-07-11 NOTE — Progress Notes (Signed)
 Contacted pt regarding after hours call. Pt was concerned about Wednesday am appointment. Pt stated she will call tomorrow afternoon and reschedule if she feels unable to come.

## 2024-07-13 ENCOUNTER — Inpatient Hospital Stay

## 2024-07-13 ENCOUNTER — Inpatient Hospital Stay: Admitting: Nurse Practitioner

## 2024-07-16 ENCOUNTER — Other Ambulatory Visit: Payer: Self-pay | Admitting: Hematology

## 2024-07-16 DIAGNOSIS — D473 Essential (hemorrhagic) thrombocythemia: Secondary | ICD-10-CM

## 2024-07-18 ENCOUNTER — Other Ambulatory Visit: Payer: Self-pay | Admitting: Nurse Practitioner

## 2024-07-18 DIAGNOSIS — D473 Essential (hemorrhagic) thrombocythemia: Secondary | ICD-10-CM

## 2024-07-18 NOTE — Progress Notes (Unsigned)
 " Patient Care Team: Yvonne Kins, MD as PCP - General (Family Medicine) Malmfelt, Delon CROME, RN as Oncology Nurse Navigator Izell Domino, MD as Consulting Physician (Radiation Oncology) Gladis Husband, MD as Referring Physician (Dermatology)  Clinic Day:  07/20/2024  Referring physician: Loreli Kins, MD  ASSESSMENT & PLAN:   Assessment & Plan: Essential thrombocythemia (HCC) , JAK2 V617F (+) -Her platelet count has been slowly trending up since 2013. The rest of her CBC are normal. She does not appear to have obvious cause for reactive thrombocytosis, such as iron deficient anemia, chronic infection or chronic inflammation. -Her JAK2 mutation and bone marrow biopsy results are consistent with essential thrombocythemia. -Pt previously declined Hydrea due to the concern of side effects. she had a TIA with aphasia and arm weakness in 2016 -She ultimately began Anagrelide  in 10/2014, tolerating very well  -last echo in July 2021 was unremarkable.  Patient is aware of potential cardiac toxicity from anagrelide  -Ms. Cardiff appears stable.  She continues anagrelide , tolerating well without significant side effects.  No signs of thrombosis.   -01/11/2024 - labs are stable and unremarkable overall.  -07/20/2024 -labs reviewed with normal CBC and stable CMP.  Blood pressure slightly elevated but well-managed at home.  Tolerates dose of anagrelide  with minimal negative side effects.  Exam is benign. -Continue the current dose of anagrelide  1 mg a.m./0.5 mg p.m. daily -Lab and follow-up in 6 months, or sooner if needed   Hypertension Blood pressure mildly elevated during today's visit.  This is managed per primary care provider.  Recently, her blood pressure medication was changed.  She is now on Norvasc which seems to control blood pressure better than prior medication.  This will continue to be monitored closely.   Plan Labs reviewed. -CBC unremarkable.  -cmp showing stable renal  dysfunction.  Tolerating current dose of anagrelide  without negative side effects. Continue anagrelide  1 mg in the morning and 0.5 mg in the evening. Plan for labs and follow-up in 6 months, sooner if needed.   The patient understands the plans discussed today and is in agreement with them.  Yvonne Rose knows to contact our office if Yvonne Rose develops concerns prior to Yvonne Rose's next appointment.  I provided 20 minutes of face-to-face time during this encounter and > 50% was spent counseling as documented under my assessment and plan.    Powell FORBES Lessen, NP  [facility] - A DEPT OF [facility] [address] [place] [address] Dept: [phone] Dept Fax: [phone]   No orders of the defined types were placed in this encounter.     CHIEF COMPLAINT:  CC: essential thrombocythemia  Current Treatment:  anagrelide  1 mg in am and 0.5 mg in the  pm  INTERVAL HISTORY:  Yvonne Rose is here today for repeat clinical assessment. She was last seen by me on 01/11/2024.  Blood pressure is slightly elevated.  She states that her primary care recently switched her BP medication to amlodipine.  At home, blood pressures better than it is here.  Patient is asymptomatic.  She reports tolerating anagrelide  well.  States every so often, she gets diarrhea.  Will take OTC Imodium for this which controls diarrhea.  She denies chest pain, chest pressure, or shortness of breath. She denies headaches or visual disturbances. She denies abdominal pain, nausea, vomiting, or changes in bowel or bladder habits.  Korayma denies fevers or chills. Yvonne Rose denies pain. Yvonne Rose appetite is good. Yvonne Rose  weight has been stable.  I have reviewed the past medical history, past surgical history, social history and family history with the patient and they are unchanged from previous note.  ALLERGIES:  is allergic to propoxyphene and sulfa  antibiotics.  MEDICATIONS:  Current Outpatient Medications  Medication Sig Dispense Refill   acetaminophen  (TYLENOL ) 500 MG tablet Take 500 mg by mouth every 8 (eight) hours as needed for mild pain.      amLODipine (NORVASC) 5 MG tablet Take 5 mg by mouth daily.     anagrelide  (AGRYLIN) 0.5 MG capsule Take 1 capsule by mouth daily after dinner in addition to 1 mg daily in the morning. 90 capsule 0   anagrelide  (AGRYLIN) 1 MG capsule Take 1 capsule by mouth once daily. 90 capsule 0   aspirin  81 MG EC tablet Adult Low Dose Aspirin  81 mg tablet,delayed release     hydrochlorothiazide (HYDRODIURIL) 25 MG tablet Take 25 mg by mouth daily.     metoprolol succinate (TOPROL-XL) 100 MG 24 hr tablet 200 mg daily.     omeprazole (PRILOSEC) 20 MG capsule Take 20 mg by mouth daily.     pravastatin  (PRAVACHOL ) 80 MG tablet Take 1 tablet (80 mg total) by mouth daily. 30 tablet 2   No current facility-administered medications for this visit.    HISTORY OF PRESENT ILLNESS:   Oncology History  Essential thrombocythemia (HCC)  08/13/2014 - 08/14/2014 Hospital Admission   Patient was admitted for acute onset headache and aphasia, spontaneously resolved quickly. Probable TIA versus migraine.   10/17/2014 Miscellaneous   JAK2 V617F mutation (+),    10/24/2014 Initial Diagnosis   Essential thrombocythemia. Pt presented with thrombocytosis with platelet [phone] since 2013.    10/24/2014 Bone Marrow Biopsy   Hypercellular marrow with increased and atypical megakaryocytes. No significant fibrosis. Myeloid and erythroid elements are not overly increased. The overall findings are suggestive of a myeloproliferative neoplasm, especially essential thrombocythemia.    11/06/2014 -  Chemotherapy   Anagrelide  0.5 mg twice daily   Basal cell carcinoma of left ala nasi  12/28/2023 Initial Diagnosis   Basal cell carcinoma of left ala nasi   12/28/2023 Cancer Staging   Staging form: Cutaneous Carcinoma of the Head  and Neck, AJCC 8th Edition - Clinical stage from 12/28/2023: Stage I (cT1, cN0, cM0) - Signed by Izell Domino, MD on 12/28/2023 Extraosseous extension: Absent       REVIEW OF SYSTEMS:   Constitutional: Denies fevers, chills or abnormal weight loss Eyes: Denies blurriness of vision Ears, nose, mouth, throat, and face: Denies mucositis or sore throat Respiratory: Denies cough, dyspnea or wheezes Cardiovascular: Denies palpitation, chest discomfort or lower extremity swelling Gastrointestinal:  Denies nausea, heartburn or change in bowel habits Skin: Denies abnormal skin rashes Lymphatics: Denies new lymphadenopathy or easy bruising Neurological:Denies numbness, tingling or new weaknesses Behavioral/Psych: Mood is stable, no new changes  All other systems were reviewed with the patient and are negative.   VITALS:   Today's Vitals   07/20/24 1132 07/20/24 1134 07/20/24 1135  BP: (!) 163/82 (!) 152/82   Pulse: 77    Resp: 17    Temp: (!) 97.2 F (36.2 C)    SpO2: 95%    Weight: 154 lb 4.8 oz (70 kg)    PainSc:   0-No pain   Body mass index is 26.49 kg/m (pended).   Wt Readings from Last 3 Encounters:  07/20/24 154 lb 4.8 oz (70 kg)  03/01/24 154 lb 3.2 oz (69.9  kg)  01/11/24 156 lb 14.4 oz (71.2 kg)    Body mass index is 26.49 kg/m (pended).  Performance status (ECOG): 0 - Asymptomatic  PHYSICAL EXAM:   GENERAL:alert, no distress and comfortable SKIN: skin color, texture, turgor are normal, no rashes or significant lesions EYES: normal, Conjunctiva are pink and non-injected, sclera clear OROPHARYNX:no exudate, no erythema and lips, buccal mucosa, and tongue normal  NECK: supple, thyroid normal size, non-tender, without nodularity LYMPH:  no palpable lymphadenopathy in the cervical, axillary or inguinal LUNGS: clear to auscultation and percussion with normal breathing effort HEART: regular rate & rhythm and no murmurs and no lower extremity edema ABDOMEN:abdomen  soft, non-tender and normal bowel sounds Musculoskeletal:no cyanosis of digits and no clubbing  NEURO: alert & oriented x 3 with fluent speech, no focal motor/sensory deficits  LABORATORY DATA:  I have reviewed the data as listed    Component Value Date/Time   NA 136 07/20/2024 1048   NA 141 04/22/2017 1243   K 4.2 07/20/2024 1048   K 4.2 04/22/2017 1243   CL 99 07/20/2024 1048   CO2 27 07/20/2024 1048   CO2 26 04/22/2017 1243   GLUCOSE 109 (H) 07/20/2024 1048   GLUCOSE 104 04/22/2017 1243   BUN 21 07/20/2024 1048   BUN 16.1 04/22/2017 1243   CREATININE 1.28 (H) 07/20/2024 1048   CREATININE 0.8 04/22/2017 1243   CALCIUM 10.1 07/20/2024 1048   CALCIUM 10.0 04/22/2017 1243   PROT 7.3 07/20/2024 1048   PROT 7.6 04/22/2017 1243   ALBUMIN 4.2 07/20/2024 1048   ALBUMIN 3.9 04/22/2017 1243   AST 21 07/20/2024 1048   AST 26 04/22/2017 1243   ALT 9 07/20/2024 1048   ALT 18 04/22/2017 1243   ALKPHOS 126 07/20/2024 1048   ALKPHOS 76 04/22/2017 1243   BILITOT 0.3 07/20/2024 1048   BILITOT 0.24 04/22/2017 1243   GFRNONAA 42 (L) 07/20/2024 1048   GFRAA 56 (L) 01/03/2020 2023    Lab Results  Component Value Date   WBC 10.8 (H) 07/20/2024   NEUTROABS 7.7 07/20/2024   HGB 12.2 07/20/2024   HCT 37.3 07/20/2024   MCV 89.7 07/20/2024   PLT 313 07/20/2024     "

## 2024-07-18 NOTE — Assessment & Plan Note (Signed)
,   JAK2 V617F (+) -Her platelet count has been slowly trending up since 2013. The rest of her CBC are normal. She does not appear to have obvious cause for reactive thrombocytosis, such as iron deficient anemia, chronic infection or chronic inflammation. -Her JAK2 mutation and bone marrow biopsy results are consistent with essential thrombocythemia. -Pt previously declined Hydrea due to the concern of side effects. she had a TIA with aphasia and arm weakness in 2016 -She ultimately began Anagrelide  in 10/2014, tolerating very well  -last echo in July 2021 was unremarkable.  Patient is aware of potential cardiac toxicity from anagrelide  -Ms. Stecher appears stable.  She continues anagrelide , tolerating well without significant side effects.  No signs of thrombosis.   -01/11/2024 - labs are stable and unremarkable overall.  -Continue the current dose of anagrelide  1 mg a.m./0.5 mg p.m. daily -Lab and follow-up in 6 months, or sooner if needed

## 2024-07-20 ENCOUNTER — Inpatient Hospital Stay

## 2024-07-20 ENCOUNTER — Inpatient Hospital Stay: Admitting: Nurse Practitioner

## 2024-07-20 ENCOUNTER — Encounter: Payer: Self-pay | Admitting: Nurse Practitioner

## 2024-07-20 VITALS — BP 152/82 | HR 77 | Temp 97.2°F | Resp 17 | Wt 154.3 lb

## 2024-07-20 DIAGNOSIS — D473 Essential (hemorrhagic) thrombocythemia: Secondary | ICD-10-CM

## 2024-07-20 LAB — RETICULOCYTES
Immature Retic Fract: 10 % (ref 2.3–15.9)
RBC.: 4.21 MIL/uL (ref 3.87–5.11)
Retic Count, Absolute: 58.9 10*3/uL (ref 19.0–186.0)
Retic Ct Pct: 1.4 % (ref 0.4–3.1)

## 2024-07-20 LAB — CMP (CANCER CENTER ONLY)
ALT: 9 U/L (ref 0–44)
AST: 21 U/L (ref 15–41)
Albumin: 4.2 g/dL (ref 3.5–5.0)
Alkaline Phosphatase: 126 U/L (ref 38–126)
Anion gap: 10 (ref 5–15)
BUN: 21 mg/dL (ref 8–23)
CO2: 27 mmol/L (ref 22–32)
Calcium: 10.1 mg/dL (ref 8.9–10.3)
Chloride: 99 mmol/L (ref 98–111)
Creatinine: 1.28 mg/dL — ABNORMAL HIGH (ref 0.44–1.00)
GFR, Estimated: 42 mL/min — ABNORMAL LOW
Glucose, Bld: 109 mg/dL — ABNORMAL HIGH (ref 70–99)
Potassium: 4.2 mmol/L (ref 3.5–5.1)
Sodium: 136 mmol/L (ref 135–145)
Total Bilirubin: 0.3 mg/dL (ref 0.0–1.2)
Total Protein: 7.3 g/dL (ref 6.5–8.1)

## 2024-07-20 LAB — CBC WITH DIFFERENTIAL (CANCER CENTER ONLY)
Abs Immature Granulocytes: 0.05 10*3/uL (ref 0.00–0.07)
Basophils Absolute: 0.1 10*3/uL (ref 0.0–0.1)
Basophils Relative: 1 %
Eosinophils Absolute: 0.4 10*3/uL (ref 0.0–0.5)
Eosinophils Relative: 4 %
HCT: 37.3 % (ref 36.0–46.0)
Hemoglobin: 12.2 g/dL (ref 12.0–15.0)
Immature Granulocytes: 1 %
Lymphocytes Relative: 14 %
Lymphs Abs: 1.5 10*3/uL (ref 0.7–4.0)
MCH: 29.3 pg (ref 26.0–34.0)
MCHC: 32.7 g/dL (ref 30.0–36.0)
MCV: 89.7 fL (ref 80.0–100.0)
Monocytes Absolute: 0.9 10*3/uL (ref 0.1–1.0)
Monocytes Relative: 9 %
Neutro Abs: 7.7 10*3/uL (ref 1.7–7.7)
Neutrophils Relative %: 71 %
Platelet Count: 313 10*3/uL (ref 150–400)
RBC: 4.16 MIL/uL (ref 3.87–5.11)
RDW: 17 % — ABNORMAL HIGH (ref 11.5–15.5)
WBC Count: 10.8 10*3/uL — ABNORMAL HIGH (ref 4.0–10.5)
nRBC: 0 % (ref 0.0–0.2)

## 2025-01-18 ENCOUNTER — Inpatient Hospital Stay: Admitting: Nurse Practitioner

## 2025-01-18 ENCOUNTER — Inpatient Hospital Stay
